# Patient Record
Sex: Male | Born: 1950
Health system: Southern US, Community
[De-identification: ages and names within clinical notes are randomized; demographics above are authoritative.]

## PROBLEM LIST (undated history)

## (undated) DIAGNOSIS — N400 Enlarged prostate without lower urinary tract symptoms: Secondary | ICD-10-CM

## (undated) DIAGNOSIS — I77810 Thoracic aortic ectasia: Secondary | ICD-10-CM

## (undated) DIAGNOSIS — I358 Other nonrheumatic aortic valve disorders: Secondary | ICD-10-CM

## (undated) DIAGNOSIS — R0600 Dyspnea, unspecified: Secondary | ICD-10-CM

## (undated) DIAGNOSIS — R9430 Abnormal result of cardiovascular function study, unspecified: Secondary | ICD-10-CM

## (undated) DIAGNOSIS — IMO0002 Reserved for concepts with insufficient information to code with codable children: Secondary | ICD-10-CM

## (undated) DIAGNOSIS — I10 Essential (primary) hypertension: Secondary | ICD-10-CM

## (undated) DIAGNOSIS — I2510 Atherosclerotic heart disease of native coronary artery without angina pectoris: Secondary | ICD-10-CM

## (undated) DIAGNOSIS — I209 Angina pectoris, unspecified: Secondary | ICD-10-CM

## (undated) HISTORY — DX: Other nonrheumatic aortic valve disorders: I35.8

## (undated) HISTORY — DX: Atherosclerotic heart disease of native coronary artery without angina pectoris: I25.10

## (undated) HISTORY — DX: Thoracic aortic ectasia: I77.810

## (undated) HISTORY — DX: Essential (primary) hypertension: I10

## (undated) HISTORY — DX: Reserved for concepts with insufficient information to code with codable children: IMO0002

## (undated) HISTORY — DX: Benign prostatic hyperplasia without lower urinary tract symptoms: N40.0

## (undated) HISTORY — DX: Abnormal result of cardiovascular function study, unspecified: R94.30

---

## 2007-03-19 HISTORY — PX: CARDIAC CATHETERIZATION: SHX172

## 2009-09-15 HISTORY — PX: CIRCUMCISION: SUR203

## 2010-12-03 ENCOUNTER — Encounter: Payer: Self-pay | Admitting: Cardiology

## 2010-12-03 DIAGNOSIS — IMO0002 Reserved for concepts with insufficient information to code with codable children: Secondary | ICD-10-CM | POA: Insufficient documentation

## 2010-12-03 DIAGNOSIS — R9430 Abnormal result of cardiovascular function study, unspecified: Secondary | ICD-10-CM | POA: Insufficient documentation

## 2010-12-03 DIAGNOSIS — I358 Other nonrheumatic aortic valve disorders: Secondary | ICD-10-CM | POA: Insufficient documentation

## 2010-12-04 ENCOUNTER — Ambulatory Visit (INDEPENDENT_AMBULATORY_CARE_PROVIDER_SITE_OTHER): Payer: BC Managed Care – PPO | Admitting: Cardiology

## 2010-12-04 ENCOUNTER — Encounter: Payer: Self-pay | Admitting: Cardiology

## 2010-12-04 DIAGNOSIS — I358 Other nonrheumatic aortic valve disorders: Secondary | ICD-10-CM

## 2010-12-04 DIAGNOSIS — I2510 Atherosclerotic heart disease of native coronary artery without angina pectoris: Secondary | ICD-10-CM

## 2010-12-04 DIAGNOSIS — I77810 Thoracic aortic ectasia: Secondary | ICD-10-CM | POA: Insufficient documentation

## 2010-12-04 DIAGNOSIS — I359 Nonrheumatic aortic valve disorder, unspecified: Secondary | ICD-10-CM

## 2010-12-04 DIAGNOSIS — R079 Chest pain, unspecified: Secondary | ICD-10-CM

## 2010-12-04 DIAGNOSIS — E119 Type 2 diabetes mellitus without complications: Secondary | ICD-10-CM | POA: Insufficient documentation

## 2010-12-04 DIAGNOSIS — I77819 Aortic ectasia, unspecified site: Secondary | ICD-10-CM

## 2010-12-04 DIAGNOSIS — I10 Essential (primary) hypertension: Secondary | ICD-10-CM

## 2010-12-04 DIAGNOSIS — R9430 Abnormal result of cardiovascular function study, unspecified: Secondary | ICD-10-CM | POA: Insufficient documentation

## 2010-12-04 MED ORDER — AMLODIPINE BESYLATE 10 MG PO TABS: 10.0000 mg | ORAL_TABLET | Freq: Every day | ORAL | Status: DC

## 2010-12-04 NOTE — Assessment & Plan Note (Signed)
The patient does have some aortic root dilatation by echo.  This is quite concerning with his significant hypertension.  We will work hard to control his blood pressure.  A followup echo can be done over time to reassess his aortic root.

## 2010-12-04 NOTE — Progress Notes (Signed)
HPI The patient is here today to establish with cardiology for multiple cardiac issues.  I reviewed the records from the patient's primary physician very carefully.  I've also looked in the records at [facility] and there are no records there.  The patient gives a history of heart catheterization in the past in [place].  I do not have those records.  According to the patient and his family he may have had some narrowing of one vessel.  He did not have intervention.  The patient had an echocardiogram done as an outpatient in July, 2012.  I reviewed the report.  Ejection fraction was 60%.  There was mild thickening of the mitral valve with mild regurgitation.  There was aortic valve sclerosis but no stenosis.  The aortic root measured 44 mm.  This is mildly dilated.  The patient has hypertension.  Review of the primary care notes show that he has had mild hypertension during some of those visits.  He has been treated aggressively with multiple medications.  Currently he is not on all meds that he was on the past.  He says that he is taking the current medicines are listed.  He takes his clonidine once a day instead of twice a day. Allergies  Allergen Reactions  . Ciprofloxacin     Current Outpatient Prescriptions  Medication Sig Dispense Refill  . aspirin EC 81 MG tablet Take 81 mg by mouth daily.        . cloNIDine (CATAPRES) 0.2 MG tablet Take 1 tablet by mouth Twice daily.      Marland Kitchen glipiZIDE (GLUCOTROL) 10 MG tablet Take 1 tablet by mouth Twice daily.      Marland Kitchen LEVEMIR FLEXPEN 100 UNIT/ML injection Inject 10 Units into the skin At bedtime.      Marland Kitchen lisinopril (PRINIVIL,ZESTRIL) 40 MG tablet Take 1 tablet by mouth Daily.      . metFORMIN (GLUCOPHAGE) 500 MG tablet Take 2 tablets by mouth Twice daily.        History   Social History  . Marital Status: Married    Spouse Name: N/A    Number of Children: N/A  . Years of Education: N/A   Occupational History  . Not on file.   Social  History Main Topics  . Smoking status: Former Smoker -- 0.3 packs/day for 40 years    Types: Cigarettes    Quit date: 03/18/2005  . Smokeless tobacco: Never Used  . Alcohol Use: Not on file  . Drug Use: Not on file  . Sexually Active: Not on file   Other Topics Concern  . Not on file   Social History Narrative  . No narrative on file    No family history on file.  Past Medical History  Diagnosis Date  . Ejection fraction     EF 60%,Echo, October 08, 2010  . Aortic root dilatation     Aortic root 44 mm, echo, October 08, 2010  . Aortic valve sclerosis     No stenosis, echo, July, 2012  . Hypertension   . Diabetes mellitus   . Gout   . BPH (benign prostatic hyperplasia)     No past surgical history on file.  ROS  Patient denies fever, chills, headache, sweats, rash, change in vision, change in hearing, chest pain, cough, nausea vomiting, urinary symptoms.  All other systems are reviewed and are negative  PHYSICAL EXAM Patient is stable today.  He is here with a family member.  He is  oriented to person time and place.  Affect is normal.  Head is atraumatic.  He has poor dentition.  There is no jugular venous distention.  There are no carotid bruits.  Lungs are clear.  Respiratory effort is nonlabored.  Cardiac exam reveals S1 and S2.  No clicks or significant murmurs.  The abdomen is soft.  There is no peripheral edema.  There no musculoskeletal deformities.  There is no skin rashes. Filed Vitals:   12/04/10 1346 12/04/10 1354  BP: 176/117 182/125  Pulse: 76 78  Height: 6\' 2"  (1.88 m)   Weight: 242 lb (109.77 kg)     EKG EKG is done today and reviewed by me.  There is normal sinus rhythm.  There is increased voltage but no significant ST-T wave changes. ASSESSMENT & PLAN

## 2010-12-04 NOTE — Assessment & Plan Note (Signed)
By history the patient underwent cardiac catheterization in 2009.  He was told there was some narrowing.  No intervention was done.  Is not having chest pain at this time.  He does not need further workup at this point.

## 2010-12-04 NOTE — Patient Instructions (Signed)
Follow up as scheduled. Start Norvasc (Amlodipine) 10 mg daily.

## 2010-12-04 NOTE — Assessment & Plan Note (Signed)
The patient's blood pressure is markedly elevated today.  He has known hypertension but it appears that it has not been this high in the past.  However he's been on additional medications in the past.  I will start today by changing his clonidine to twice daily.  We'll add amlodipine 10 mg daily.  He'll then see him for followup.  I talked with him about limiting his salt intake and trying to not drink fluids excessively.  However his mouth is dry from the clonidine

## 2010-12-04 NOTE — Assessment & Plan Note (Signed)
The patient has only aortic valve sclerosis.  There is a soft murmur.  There is no significant stenosis by history

## 2010-12-21 ENCOUNTER — Ambulatory Visit: Payer: BC Managed Care – PPO | Admitting: Cardiology

## 2011-01-06 DIAGNOSIS — R079 Chest pain, unspecified: Secondary | ICD-10-CM

## 2011-02-11 ENCOUNTER — Ambulatory Visit: Payer: BC Managed Care – PPO | Admitting: Cardiology

## 2011-07-14 ENCOUNTER — Other Ambulatory Visit: Payer: Self-pay | Admitting: Cardiology

## 2011-07-15 ENCOUNTER — Other Ambulatory Visit: Payer: Self-pay

## 2011-07-15 MED ORDER — AMLODIPINE BESYLATE 10 MG PO TABS: 10.0000 mg | ORAL_TABLET | Freq: Every day | ORAL | Status: DC

## 2011-11-01 ENCOUNTER — Other Ambulatory Visit: Payer: Self-pay | Admitting: Cardiology

## 2011-11-01 NOTE — Telephone Encounter (Signed)
Fax Received. Refill Completed. Osmany Azer Chowoe (R.M.A)   

## 2012-02-27 ENCOUNTER — Telehealth: Payer: Self-pay | Admitting: *Deleted

## 2012-02-27 MED ORDER — AMLODIPINE BESYLATE 10 MG PO TABS: 5.0000 mg | ORAL_TABLET | Freq: Every day | ORAL | Status: DC

## 2012-02-27 NOTE — Telephone Encounter (Signed)
A 

## 2013-03-17 ENCOUNTER — Other Ambulatory Visit: Payer: Self-pay | Admitting: Cardiology

## 2015-10-05 ENCOUNTER — Encounter: Payer: Self-pay | Admitting: *Deleted

## 2015-10-06 ENCOUNTER — Encounter: Payer: Self-pay | Admitting: Cardiovascular Disease

## 2015-10-06 ENCOUNTER — Ambulatory Visit (INDEPENDENT_AMBULATORY_CARE_PROVIDER_SITE_OTHER): Payer: Medicare Other | Admitting: Cardiovascular Disease

## 2015-10-06 VITALS — BP 198/106 | HR 64 | Ht 74.0 in | Wt 241.0 lb

## 2015-10-06 DIAGNOSIS — R51 Headache: Secondary | ICD-10-CM

## 2015-10-06 DIAGNOSIS — I10 Essential (primary) hypertension: Secondary | ICD-10-CM

## 2015-10-06 DIAGNOSIS — R519 Headache, unspecified: Secondary | ICD-10-CM

## 2015-10-06 MED ORDER — AMLODIPINE BESYLATE 5 MG PO TABS: 5.0000 mg | ORAL_TABLET | Freq: Every day | ORAL | Status: DC

## 2015-10-06 MED ORDER — AZILSARTAN MEDOXOMIL 80 MG PO TABS: 1.0000 | ORAL_TABLET | Freq: Every day | ORAL | Status: DC

## 2015-10-06 NOTE — Progress Notes (Signed)
Patient ID: Alfred Miller, male   DOB: 1950/06/11, 65 y.o.   MRN: [id number]       CARDIOLOGY CONSULT NOTE  Patient ID: Alfred Miller MRN: [id number] DOB/AGE: [birth year] 65 y.o.  Admit date: (Not on file) Primary Physician: Glenda Chroman, MD Referring Physician: Clyde Lundborg MD  Reason for Consultation: CAD, malignant HTN  HPI: The patient is a 65 year old male with a history of prostate cancer who is referred for the management of malignant hypertension. Systolic blood pressure is over 200 today. On 08/07/15 180/104.  He saw Dr. Ron Parker in September 2012. At that time he added amlodipine 10 mg daily. Today he is taking 5 mg daily. He also has a history of diabetes. He also reportedly has a history of aortic dilatation by echocardiogram. Left ventricular systolic function was normal at that time. There was some degree of aortic sclerosis without stenosis. He reportedly underwent coronary angiography in 2009 but did not undergo percutaneous coronary mention.  He appears to have a cognitive disability. Neither he nor his family member have a clear idea of what medications he is taking. They appear to be frustrated with his PCP because he keeps increasing medications to control his blood pressure. My nurse contacted his pharmacy and it appears neither carvedilol nor clonidine have been filled and he is only taking lisinopril 40 mg today.  ECG performed in the office today which I personally reviewed demonstrates normal sinus rhythm with no ischemic ST segment or T-wave abnormalities, nor any arrhythmias.  He denies exertional chest pain and shortness of breath. He does have headaches. He denies palpitations and leg swelling.  Allergies  Allergen Reactions  . Ciprofloxacin     Current Outpatient Prescriptions  Medication Sig Dispense Refill  . Insulin Glargine (TOUJEO SOLOSTAR [place]) Inject into the skin as directed.    Marland Kitchen amLODipine (NORVASC) 5 MG tablet TAKE ONE TABLET BY MOUTH EVERY DAY 7 tablet 0    . aspirin EC 81 MG tablet Take 81 mg by mouth daily.      . cloNIDine (CATAPRES) 0.2 MG tablet Take 1 tablet by mouth Twice daily.    Marland Kitchen glipiZIDE (GLUCOTROL) 10 MG tablet Take 1 tablet by mouth Twice daily.    Marland Kitchen LEVEMIR FLEXPEN 100 UNIT/ML injection Inject 10 Units into the skin At bedtime.    Marland Kitchen lisinopril (PRINIVIL,ZESTRIL) 40 MG tablet Take 1 tablet by mouth Daily.    . metFORMIN (GLUCOPHAGE) 500 MG tablet Take 2 tablets by mouth Twice daily.     No current facility-administered medications for this visit.    Past Medical History  Diagnosis Date  . Ejection fraction     EF 60%,Echo, October 08, 2010  . Aortic root dilatation (HCC)     Aortic root 44 mm, echo, October 08, 2010  . Aortic valve sclerosis     No stenosis, echo, July, 2012  . Hypertension   . Diabetes mellitus   . Gout   . BPH (benign prostatic hyperplasia)   . CAD (coronary artery disease)     Catheterization 2009, "some narrowing"    Past Surgical History  Procedure Laterality Date  . Circumcision  09/2009  . Cardiac catheterization  2009    Social History   Social History  . Marital Status: Married    Spouse Name: N/A  . Number of Children: N/A  . Years of Education: N/A   Occupational History  . Not on file.   Social History Main Topics  . Smoking status: Former Smoker --  0.30 packs/day for 40 years    Types: Cigarettes    Start date: 06/26/1968    Quit date: 03/18/2005  . Smokeless tobacco: Never Used  . Alcohol Use: Not on file  . Drug Use: Not on file  . Sexual Activity: Not on file   Other Topics Concern  . Not on file   Social History Narrative     No family history of premature CAD in 1st degree relatives.  Prior to Admission medications   Medication Sig Start Date End Date Taking? Authorizing Provider  Insulin Glargine (TOUJEO SOLOSTAR [place]) Inject into the skin as directed.   Yes Historical Provider, MD  amLODipine (NORVASC) 5 MG tablet TAKE ONE TABLET BY MOUTH EVERY DAY 03/17/13    Carlena Bjornstad, MD  aspirin EC 81 MG tablet Take 81 mg by mouth daily.      Historical Provider, MD  cloNIDine (CATAPRES) 0.2 MG tablet Take 1 tablet by mouth Twice daily. 12/03/10   Historical Provider, MD  glipiZIDE (GLUCOTROL) 10 MG tablet Take 1 tablet by mouth Twice daily. 12/03/10   Historical Provider, MD  LEVEMIR FLEXPEN 100 UNIT/ML injection Inject 10 Units into the skin At bedtime. 12/03/10   Historical Provider, MD  lisinopril (PRINIVIL,ZESTRIL) 40 MG tablet Take 1 tablet by mouth Daily. 12/03/10   Historical Provider, MD  metFORMIN (GLUCOPHAGE) 500 MG tablet Take 2 tablets by mouth Twice daily. 12/03/10   Historical Provider, MD     Review of systems complete and found to be negative unless listed above in HPI     Physical exam Blood pressure 198/106, pulse 64, height 6\' 2"  (1.88 m), weight 241 lb (109.317 kg), SpO2 96 %. General: NAD Neck: No JVD, no thyromegaly or thyroid nodule.  Lungs: Clear to auscultation bilaterally with normal respiratory effort. CV: Nondisplaced PMI. Regular rate and rhythm, normal S1/S2, no S3/S4, no murmur.  No peripheral edema.  No carotid bruit.   Abdomen: Soft, nontender, no distention.  Skin: Intact without lesions or rashes.  Neurologic: Alert and oriented x 3.  Psych: Normal affect. Extremities: No clubbing or cyanosis.  HEENT: Normal.   ECG: Most recent ECG reviewed.  Labs:  No results found for: WBC, HGB, HCT, MCV, PLT No results for input(s): NA, K, CL, CO2, BUN, CREATININE, CALCIUM, PROT, BILITOT, ALKPHOS, ALT, AST, GLUCOSE in the last 168 hours.  Invalid input(s): LABALBU No results found for: CKTOTAL, CKMB, CKMBINDEX, TROPONINI No results found for: CHOL No results found for: HDL No results found for: LDLCALC No results found for: TRIG No results found for: CHOLHDL No results found for: LDLDIRECT       Studies: No results found.  ASSESSMENT AND PLAN:  1. Malignant HTN: Will start amlodipine 5 mg daily. Will switch  lisinopril 40 mg to Edarbi 80 mg daily.  Will not use Coreg given resting HR in 60 bpm range. I will order a 2-D echocardiogram with Doppler to evaluate cardiac structure, function, and regional wall motion.  Dispo: fu with nurse in 3 weeks for BP check. FU with me in 3 months.    Signed: Kate Sable, M.D., F.A.C.C.  10/06/2015, 9:16 AM

## 2015-10-06 NOTE — Addendum Note (Signed)
Addended by: Merlene Laughter on: 10/06/2015 11:46 AM   Modules accepted: Orders, Medications

## 2015-10-06 NOTE — Patient Instructions (Addendum)
Medication Instructions:   Begin Amlodipine 5mg  daily - printed script given today.   Stop Lisinopril.  Begin Edarbi 80mg  daily - samples, printed script, and [employer] given.    Continue all other medications the same. Please refer to the list of medications on this paper for your current medication list.    Labwork: NONE  Testing/Procedures:  Your physician has requested that you have an echocardiogram. Echocardiography is a painless test that uses sound waves to create images of your heart. It provides your doctor with information about the size and shape of your heart and how well your heart's chambers and valves are working. This procedure takes approximately one hour. There are no restrictions for this procedure.  Office will contact with results via phone or letter.    Follow-Up:  3 months   Any Other Special Instructions Will Be Listed Below (If Applicable).  Nurse visit in 3 weeks for blood pressure check in office.    If you need a refill on your cardiac medications before your next appointment, please call your pharmacy.

## 2015-10-27 ENCOUNTER — Encounter: Payer: Self-pay | Admitting: *Deleted

## 2015-10-27 ENCOUNTER — Ambulatory Visit (INDEPENDENT_AMBULATORY_CARE_PROVIDER_SITE_OTHER): Payer: Medicare Other | Admitting: *Deleted

## 2015-10-27 ENCOUNTER — Telehealth: Payer: Self-pay | Admitting: *Deleted

## 2015-10-27 VITALS — BP 160/97 | HR 62 | Ht 74.0 in | Wt 241.0 lb

## 2015-10-27 DIAGNOSIS — I10 Essential (primary) hypertension: Secondary | ICD-10-CM

## 2015-10-27 MED ORDER — AMLODIPINE BESYLATE 10 MG PO TABS: 10.0000 mg | ORAL_TABLET | Freq: Every day | ORAL | 3 refills | Status: DC

## 2015-10-27 NOTE — Progress Notes (Signed)
Increase amlodipine to 10 mg

## 2015-10-27 NOTE — Progress Notes (Signed)
Patient came to office today for nurse BP check per last office visit. Patient did not bring his medications to the visit but verbalized that he is taking the medications that were called out to patient during nurse visit. Patient said he has been taking his medications as prescribed without missing any dose. Patient denies side effects, chest pain, dizziness or sob. Patient advised that his BP result would be sent to his doctor.

## 2015-10-27 NOTE — Telephone Encounter (Signed)
Wife informed and verbalized understanding of plan. 

## 2015-10-27 NOTE — Telephone Encounter (Signed)
-----   Message from Herminio Commons, MD sent at 10/27/2015 10:06 AM EDT -----   ----- Message ----- From: Merlene Laughter, LPN Sent: [id number]   9:41 AM To: Herminio Commons, MD

## 2015-10-30 ENCOUNTER — Other Ambulatory Visit: Payer: Self-pay | Admitting: *Deleted

## 2015-10-30 MED ORDER — AZILSARTAN MEDOXOMIL 80 MG PO TABS: 1.0000 | ORAL_TABLET | Freq: Every day | ORAL | 6 refills | Status: DC

## 2015-11-01 ENCOUNTER — Other Ambulatory Visit: Payer: Medicare Other

## 2015-11-22 ENCOUNTER — Other Ambulatory Visit: Payer: Medicare Other

## 2015-12-04 ENCOUNTER — Other Ambulatory Visit: Payer: Self-pay | Admitting: Cardiovascular Disease

## 2015-12-04 DIAGNOSIS — I2510 Atherosclerotic heart disease of native coronary artery without angina pectoris: Secondary | ICD-10-CM

## 2015-12-14 ENCOUNTER — Other Ambulatory Visit: Payer: Medicare Other

## 2015-12-21 ENCOUNTER — Telehealth: Payer: Self-pay | Admitting: Cardiovascular Disease

## 2015-12-21 NOTE — Telephone Encounter (Signed)
Mrs. Talford called the office in regards to an upcoming appointment for Alfred Miller to have an echo. She states that he is doing radiation on a daily basis at [facility]. She states she will call back when patient is able.

## 2015-12-21 NOTE — Telephone Encounter (Signed)
Noted  

## 2015-12-29 ENCOUNTER — Ambulatory Visit: Payer: Medicare Other | Admitting: Cardiovascular Disease

## 2016-02-05 ENCOUNTER — Telehealth: Payer: Self-pay | Admitting: Cardiovascular Disease

## 2016-02-05 DIAGNOSIS — I2510 Atherosclerotic heart disease of native coronary artery without angina pectoris: Secondary | ICD-10-CM

## 2016-02-05 MED ORDER — LISINOPRIL 40 MG PO TABS: 40.0000 mg | ORAL_TABLET | Freq: Every day | ORAL | 3 refills | Status: DC

## 2016-02-05 NOTE — Telephone Encounter (Signed)
According to pt Alfred Miller would cost $90 monthly after he has already applied for assistance and insurance coverage. Says that at some point Azilsartan 80 mg was sent in place of this and this also will cost $90 / monthly. Says he hasn't been taking [place] in months.  Also wants to reschedule echo - have sent to schedulers.

## 2016-02-05 NOTE — Telephone Encounter (Signed)
Can go back to lisinopril 40 mg and have BP checked in a week by nurse. If it remains elevated, would increase amlodipine to 10 mg.

## 2016-02-05 NOTE — Telephone Encounter (Signed)
Would first contact pharm rep to see what, if anything, can be done to assist patient.

## 2016-02-05 NOTE — Telephone Encounter (Signed)
Patient's wife called stating that they can not afford medication and would like to know if something else he can take [place]

## 2016-02-05 NOTE — Telephone Encounter (Signed)
Pt aware - lisinopril sent to pharmacy - pt will come 11/29 for nurse BP check and 12/7 for echo.

## 2016-02-22 ENCOUNTER — Telehealth: Payer: Self-pay | Admitting: *Deleted

## 2016-02-22 ENCOUNTER — Ambulatory Visit: Payer: Medicare Other | Admitting: *Deleted

## 2016-02-22 ENCOUNTER — Other Ambulatory Visit: Payer: Self-pay

## 2016-02-22 ENCOUNTER — Ambulatory Visit (INDEPENDENT_AMBULATORY_CARE_PROVIDER_SITE_OTHER): Payer: Medicare Other

## 2016-02-22 VITALS — BP 120/76 | HR 60

## 2016-02-22 DIAGNOSIS — I2510 Atherosclerotic heart disease of native coronary artery without angina pectoris: Secondary | ICD-10-CM | POA: Diagnosis not present

## 2016-02-22 DIAGNOSIS — I10 Essential (primary) hypertension: Secondary | ICD-10-CM

## 2016-02-22 NOTE — Telephone Encounter (Signed)
Notes Recorded by Laurine Blazer, LPN on [id number] at [id number] PM EST Wife Peter [place]) notified. Follow up OV scheduled as he was last seen in July 2017. Copy to pmd. ------  Notes Recorded by Herminio Commons, MD on 02/22/2016 at 4:20 PM EST Normal pumping function. Mild aortic enlargement. In one year, would obtain CTA of thoracic aorta.

## 2016-02-22 NOTE — Progress Notes (Signed)
Good results. Continue current therapy.

## 2016-02-22 NOTE — Progress Notes (Signed)
Wife Peter Congo) notified.

## 2016-02-22 NOTE — Progress Notes (Signed)
Patient in office this morning for nurse BP check. Back on the Lisinopril 40mg  daily.

## 2016-03-15 ENCOUNTER — Encounter: Payer: Self-pay | Admitting: *Deleted

## 2016-03-19 ENCOUNTER — Encounter: Payer: Self-pay | Admitting: Cardiovascular Disease

## 2016-03-19 ENCOUNTER — Ambulatory Visit (INDEPENDENT_AMBULATORY_CARE_PROVIDER_SITE_OTHER): Payer: Medicare HMO | Admitting: Cardiovascular Disease

## 2016-03-19 VITALS — BP 170/110 | HR 84 | Ht 74.0 in | Wt 248.0 lb

## 2016-03-19 DIAGNOSIS — I10 Essential (primary) hypertension: Secondary | ICD-10-CM

## 2016-03-19 DIAGNOSIS — I7789 Other specified disorders of arteries and arterioles: Secondary | ICD-10-CM | POA: Diagnosis not present

## 2016-03-19 MED ORDER — CARVEDILOL 3.125 MG PO TABS: 3.1250 mg | ORAL_TABLET | Freq: Two times a day (BID) | ORAL | 6 refills | Status: DC

## 2016-03-19 NOTE — Progress Notes (Signed)
SUBJECTIVE: The patient presents for follow-up of malignant hypertension. Echocardiogram 02/22/16: Normal left ventricular systolic function and regional wall motion, LVEF 55-60%, mild-to-moderate concentric LVH, indeterminate grade diastolic dysfunction, mild aortic root and ascending aortic enlargement, 4.2 cm.  He has intermittent chest pains primarily when he is arguing with his wife. It is accompanied by shortness of breath. This is not frequent. He denies leg swelling.  Review of Systems: As per "subjective", otherwise negative.  Allergies  Allergen Reactions  . Ciprofloxacin     Current Outpatient Prescriptions  Medication Sig Dispense Refill  . aspirin EC 81 MG tablet Take 81 mg by mouth daily. Reported on 10/06/2015    . bicalutamide (CASODEX) 50 MG tablet Take 50 mg by mouth daily.    Marland Kitchen glipiZIDE (GLUCOTROL) 10 MG tablet Take 1 tablet by mouth Twice daily.    . Insulin Glargine (TOUJEO SOLOSTAR [place]) Inject into the skin as directed.    Marland Kitchen lisinopril (PRINIVIL,ZESTRIL) 40 MG tablet Take 1 tablet (40 mg total) by mouth daily. 90 tablet 3  . amLODipine (NORVASC) 10 MG tablet Take 1 tablet (10 mg total) by mouth daily. 90 tablet 3   No current facility-administered medications for this visit.     Past Medical History:  Diagnosis Date  . Aortic root dilatation (HCC)    Aortic root 44 mm, echo, October 08, 2010  . Aortic valve sclerosis    No stenosis, echo, July, 2012  . BPH (benign prostatic hyperplasia)   . CAD (coronary artery disease)    Catheterization 2009, "some narrowing"  . Diabetes mellitus   . Ejection fraction    EF 60%,Echo, October 08, 2010  . Gout   . Hypertension     Past Surgical History:  Procedure Laterality Date  . CARDIAC CATHETERIZATION  2009  . CIRCUMCISION  09/2009    Social History   Social History  . Marital status: Married    Spouse name: N/A  . Number of children: N/A  . Years of education: N/A   Occupational History  . Not on  file.   Social History Main Topics  . Smoking status: Former Smoker    Packs/day: 0.30    Years: 40.00    Types: Cigarettes    Start date: 06/26/1968    Quit date: 03/18/2005  . Smokeless tobacco: Never Used  . Alcohol use Not on file  . Drug use: Unknown  . Sexual activity: Not on file   Other Topics Concern  . Not on file   Social History Narrative  . No narrative on file     Vitals:   03/19/16 1543  BP: (!) 170/110  Pulse: 84  SpO2: 98%  Weight: 248 lb (112.5 kg)  Height: 6\' 2"  (1.88 m)    PHYSICAL EXAM General: NAD HEENT: Normal. Neck: No JVD, no thyromegaly. Lungs: Clear to auscultation bilaterally with normal respiratory effort. CV: Nondisplaced PMI.  Regular rate and rhythm, normal S1/S2, no S3/S4, no murmur. No pretibial or periankle edema.  No carotid bruit.   Abdomen: Soft, nontender, no distention.  Neurologic: Alert and oriented.  Psych: Normal affect. Skin: Normal. Musculoskeletal: No gross deformities.    ECG: Most recent ECG reviewed.      ASSESSMENT AND PLAN: 1. Malignant hypertension: Markedly elevated on amlodipine 10 mg and lisinopril 40 mg. Will start Coreg 3.125 mg bid.  2. Mild aortic root and ascending aortic enlargement, 4.2 cm: Obtain CTA in 1 year.  Dispo: fu 6 months  Kate Sable, M.D., F.A.C.C.

## 2016-03-19 NOTE — Patient Instructions (Signed)
Medication Instructions:   Begin Coreg 3.125mg  twice a day   Continue all other medications.    Labwork: none  Testing/Procedures: none  Follow-Up: Your physician wants you to follow up in: 6 months.  You will receive a reminder letter in the mail one-two months in advance.  If you don't receive a letter, please call our office to schedule the follow up appointment   Any Other Special Instructions Will Be Listed Below (If Applicable).  If you need a refill on your cardiac medications before your next appointment, please call your pharmacy.

## 2016-04-10 DIAGNOSIS — J069 Acute upper respiratory infection, unspecified: Secondary | ICD-10-CM | POA: Diagnosis not present

## 2016-04-10 DIAGNOSIS — E119 Type 2 diabetes mellitus without complications: Secondary | ICD-10-CM | POA: Diagnosis not present

## 2016-04-10 DIAGNOSIS — R509 Fever, unspecified: Secondary | ICD-10-CM | POA: Diagnosis not present

## 2016-04-10 DIAGNOSIS — R05 Cough: Secondary | ICD-10-CM | POA: Diagnosis not present

## 2016-04-16 DIAGNOSIS — R3915 Urgency of urination: Secondary | ICD-10-CM | POA: Diagnosis not present

## 2016-04-16 DIAGNOSIS — C61 Malignant neoplasm of prostate: Secondary | ICD-10-CM | POA: Diagnosis not present

## 2016-07-19 ENCOUNTER — Ambulatory Visit (INDEPENDENT_AMBULATORY_CARE_PROVIDER_SITE_OTHER): Payer: Medicare HMO | Admitting: Urology

## 2016-07-19 DIAGNOSIS — C61 Malignant neoplasm of prostate: Secondary | ICD-10-CM

## 2016-07-19 DIAGNOSIS — N5201 Erectile dysfunction due to arterial insufficiency: Secondary | ICD-10-CM | POA: Diagnosis not present

## 2016-07-19 DIAGNOSIS — N393 Stress incontinence (female) (male): Secondary | ICD-10-CM | POA: Diagnosis not present

## 2016-07-19 DIAGNOSIS — Z8551 Personal history of malignant neoplasm of bladder: Secondary | ICD-10-CM

## 2016-07-23 ENCOUNTER — Ambulatory Visit (INDEPENDENT_AMBULATORY_CARE_PROVIDER_SITE_OTHER): Payer: Medicare HMO | Admitting: Urology

## 2016-07-23 DIAGNOSIS — C61 Malignant neoplasm of prostate: Secondary | ICD-10-CM | POA: Diagnosis not present

## 2016-08-08 DIAGNOSIS — Z0000 Encounter for general adult medical examination without abnormal findings: Secondary | ICD-10-CM | POA: Diagnosis not present

## 2016-08-08 DIAGNOSIS — Z1389 Encounter for screening for other disorder: Secondary | ICD-10-CM | POA: Diagnosis not present

## 2016-08-08 DIAGNOSIS — E7800 Pure hypercholesterolemia, unspecified: Secondary | ICD-10-CM | POA: Diagnosis not present

## 2016-08-08 DIAGNOSIS — R5383 Other fatigue: Secondary | ICD-10-CM | POA: Diagnosis not present

## 2016-08-08 DIAGNOSIS — E1142 Type 2 diabetes mellitus with diabetic polyneuropathy: Secondary | ICD-10-CM | POA: Diagnosis not present

## 2016-08-08 DIAGNOSIS — Z7189 Other specified counseling: Secondary | ICD-10-CM | POA: Diagnosis not present

## 2016-08-08 DIAGNOSIS — I10 Essential (primary) hypertension: Secondary | ICD-10-CM | POA: Diagnosis not present

## 2016-08-08 DIAGNOSIS — Z79899 Other long term (current) drug therapy: Secondary | ICD-10-CM | POA: Diagnosis not present

## 2016-08-08 DIAGNOSIS — Z1211 Encounter for screening for malignant neoplasm of colon: Secondary | ICD-10-CM | POA: Diagnosis not present

## 2016-08-08 DIAGNOSIS — Z6832 Body mass index (BMI) 32.0-32.9, adult: Secondary | ICD-10-CM | POA: Diagnosis not present

## 2016-08-08 DIAGNOSIS — Z125 Encounter for screening for malignant neoplasm of prostate: Secondary | ICD-10-CM | POA: Diagnosis not present

## 2016-08-08 DIAGNOSIS — Z299 Encounter for prophylactic measures, unspecified: Secondary | ICD-10-CM | POA: Diagnosis not present

## 2016-09-19 DIAGNOSIS — I10 Essential (primary) hypertension: Secondary | ICD-10-CM | POA: Diagnosis not present

## 2016-09-19 DIAGNOSIS — E119 Type 2 diabetes mellitus without complications: Secondary | ICD-10-CM | POA: Diagnosis not present

## 2016-09-19 DIAGNOSIS — Z923 Personal history of irradiation: Secondary | ICD-10-CM | POA: Diagnosis not present

## 2016-09-19 DIAGNOSIS — E669 Obesity, unspecified: Secondary | ICD-10-CM | POA: Diagnosis not present

## 2016-09-19 DIAGNOSIS — Z6830 Body mass index (BMI) 30.0-30.9, adult: Secondary | ICD-10-CM | POA: Diagnosis not present

## 2016-09-19 DIAGNOSIS — Z79899 Other long term (current) drug therapy: Secondary | ICD-10-CM | POA: Diagnosis not present

## 2016-09-19 DIAGNOSIS — C61 Malignant neoplasm of prostate: Secondary | ICD-10-CM | POA: Diagnosis not present

## 2016-09-19 DIAGNOSIS — Z794 Long term (current) use of insulin: Secondary | ICD-10-CM | POA: Diagnosis not present

## 2016-09-19 DIAGNOSIS — Z0000 Encounter for general adult medical examination without abnormal findings: Secondary | ICD-10-CM | POA: Diagnosis not present

## 2016-09-19 DIAGNOSIS — K08409 Partial loss of teeth, unspecified cause, unspecified class: Secondary | ICD-10-CM | POA: Diagnosis not present

## 2016-09-23 ENCOUNTER — Ambulatory Visit (INDEPENDENT_AMBULATORY_CARE_PROVIDER_SITE_OTHER): Payer: Medicare HMO | Admitting: Cardiovascular Disease

## 2016-09-23 ENCOUNTER — Encounter: Payer: Self-pay | Admitting: Cardiovascular Disease

## 2016-09-23 VITALS — BP 128/84 | HR 60 | Ht 74.0 in | Wt 234.0 lb

## 2016-09-23 DIAGNOSIS — I10 Essential (primary) hypertension: Secondary | ICD-10-CM

## 2016-09-23 DIAGNOSIS — I7789 Other specified disorders of arteries and arterioles: Secondary | ICD-10-CM

## 2016-09-23 DIAGNOSIS — R531 Weakness: Secondary | ICD-10-CM

## 2016-09-23 NOTE — Progress Notes (Signed)
SUBJECTIVE: The patient presents for follow-up of malignant hypertension. Echocardiogram 02/22/16: Normal left ventricular systolic function and regional wall motion, LVEF 55-60%, mild-to-moderate concentric LVH, indeterminate grade diastolic dysfunction, mild aortic root and ascending aortic enlargement, 4.2 cm.  ECG performed in the office today which I ordered and personally interpreted demonstrated sinus bradycardia, 58 bpm, and nonspecific T-wave abnormalities.  He denies chest pain, palpitations, and shortness of breath. He has a list of other somatic complaints which include lower extremity weakness when getting up in the morning with history of falls. He denies having seen a neurologist.   Review of Systems: As per "subjective", otherwise negative.  Allergies  Allergen Reactions  . Ciprofloxacin     Current Outpatient Prescriptions  Medication Sig Dispense Refill  . amLODipine (NORVASC) 10 MG tablet     . aspirin EC 81 MG tablet Take 81 mg by mouth daily. Reported on 10/06/2015    . bicalutamide (CASODEX) 50 MG tablet Take 50 mg by mouth daily.    . carvedilol (COREG) 3.125 MG tablet Take 1 tablet (3.125 mg total) by mouth 2 (two) times daily. 60 tablet 6  . glipiZIDE (GLUCOTROL) 10 MG tablet Take 1 tablet by mouth Twice daily.    . Insulin Glargine (TOUJEO SOLOSTAR [place]) Inject into the skin as directed.    Marland Kitchen lisinopril (PRINIVIL,ZESTRIL) 40 MG tablet      No current facility-administered medications for this visit.     Past Medical History:  Diagnosis Date  . Aortic root dilatation (HCC)    Aortic root 44 mm, echo, October 08, 2010  . Aortic valve sclerosis    No stenosis, echo, July, 2012  . BPH (benign prostatic hyperplasia)   . CAD (coronary artery disease)    Catheterization 2009, "some narrowing"  . Diabetes mellitus   . Ejection fraction    EF 60%,Echo, October 08, 2010  . Gout   . Hypertension     Past Surgical History:  Procedure Laterality Date  .  CARDIAC CATHETERIZATION  2009  . CIRCUMCISION  09/2009    Social History   Social History  . Marital status: Married    Spouse name: N/A  . Number of children: N/A  . Years of education: N/A   Occupational History  . Not on file.   Social History Main Topics  . Smoking status: Former Smoker    Packs/day: 0.30    Years: 40.00    Types: Cigarettes    Start date: 06/26/1968    Quit date: 03/18/2005  . Smokeless tobacco: Never Used  . Alcohol use Not on file  . Drug use: Unknown  . Sexual activity: Not on file   Other Topics Concern  . Not on file   Social History Narrative  . No narrative on file     Vitals:   09/23/16 0830  BP: 128/84  Pulse: 60  SpO2: 94%  Weight: 234 lb (106.1 kg)  Height: 6\' 2"  (1.88 m)    Wt Readings from Last 3 Encounters:  09/23/16 234 lb (106.1 kg)  03/19/16 248 lb (112.5 kg)  10/27/15 241 lb (109.3 kg)     PHYSICAL EXAM General: NAD HEENT: Normal. Neck: No JVD, no thyromegaly. Lungs: Clear to auscultation bilaterally with normal respiratory effort. CV: Nondisplaced PMI.  Regular rate and rhythm, normal S1/S2, no S3/S4, no murmur. No pretibial or periankle edema.  No carotid bruit.   Abdomen: Soft, nontender, no distention.  Neurologic: Alert and oriented.  Psych: Normal  affect. Skin: Normal. Musculoskeletal: No gross deformities.    ECG: Most recent ECG reviewed.   Labs: No results found for: K, BUN, CREATININE, ALT, TSH, HGB   Lipids: No results found for: LDLCALC, LDLDIRECT, CHOL, TRIG, HDL     ASSESSMENT AND PLAN:  1. Malignant hypertension: Controlled on present therapy. No changes.  2. Mild aortic root and ascending aortic enlargement, 4.2 cm: Obtain CTA in 1 year.  3. Generalized weakness with history of falls: I will make a neurology referral.    Disposition: Follow up 1 yr.  Kate Sable, M.D., F.A.C.C.

## 2016-09-23 NOTE — Patient Instructions (Signed)
Medication Instructions:  Continue all current medications.  Labwork: none  Testing/Procedures: none  Follow-Up: Your physician wants you to follow up in:  1 year.  You will receive a reminder letter in the mail one-two months in advance.  If you don't receive a letter, please call our office to schedule the follow up appointment  . Any Other Special Instructions Will Be Listed Below (If Applicable). You have been referred to:  Dr. Phillips Odor Linna Hoff   If you need a refill on your cardiac medications before your next appointment, please call your pharmacy.

## 2016-10-01 DIAGNOSIS — M159 Polyosteoarthritis, unspecified: Secondary | ICD-10-CM | POA: Diagnosis not present

## 2016-10-01 DIAGNOSIS — Z6831 Body mass index (BMI) 31.0-31.9, adult: Secondary | ICD-10-CM | POA: Diagnosis not present

## 2016-10-01 DIAGNOSIS — Z299 Encounter for prophylactic measures, unspecified: Secondary | ICD-10-CM | POA: Diagnosis not present

## 2016-10-01 DIAGNOSIS — E1142 Type 2 diabetes mellitus with diabetic polyneuropathy: Secondary | ICD-10-CM | POA: Diagnosis not present

## 2016-10-01 DIAGNOSIS — I10 Essential (primary) hypertension: Secondary | ICD-10-CM | POA: Diagnosis not present

## 2016-10-01 DIAGNOSIS — I259 Chronic ischemic heart disease, unspecified: Secondary | ICD-10-CM | POA: Diagnosis not present

## 2016-10-02 ENCOUNTER — Telehealth: Payer: Self-pay | Admitting: Cardiovascular Disease

## 2016-10-02 NOTE — Telephone Encounter (Signed)
Forward to provider for any further suggestions.

## 2016-10-02 NOTE — Telephone Encounter (Signed)
Was told by his PCP, Dr Woody Seller, that patient was taken  too strong of a dose of carvedilol (COREG) 3.125 MG tablet and that he needed to cut it in half.   Also told him that he did not need to see a neurologist.  They cancelled appt with neurologist per Dr Marcial Pacas instructions.

## 2016-10-02 NOTE — Telephone Encounter (Signed)
No further suggestions

## 2016-10-03 MED ORDER — CARVEDILOL 3.125 MG PO TABS: ORAL_TABLET | ORAL | Status: DC

## 2016-10-03 NOTE — Telephone Encounter (Signed)
Patient & wife Peter Congo) notified.

## 2016-10-03 NOTE — Telephone Encounter (Signed)
Alfred Miller returned a call to [place]. Please return the call.

## 2016-10-03 NOTE — Telephone Encounter (Signed)
Left message to return call 

## 2016-10-22 DIAGNOSIS — C61 Malignant neoplasm of prostate: Secondary | ICD-10-CM | POA: Diagnosis not present

## 2016-10-25 ENCOUNTER — Ambulatory Visit (INDEPENDENT_AMBULATORY_CARE_PROVIDER_SITE_OTHER): Payer: Medicare HMO | Admitting: Urology

## 2016-10-25 DIAGNOSIS — N393 Stress incontinence (female) (male): Secondary | ICD-10-CM | POA: Diagnosis not present

## 2016-10-25 DIAGNOSIS — Z8551 Personal history of malignant neoplasm of bladder: Secondary | ICD-10-CM

## 2016-10-25 DIAGNOSIS — C61 Malignant neoplasm of prostate: Secondary | ICD-10-CM | POA: Diagnosis not present

## 2016-11-08 ENCOUNTER — Ambulatory Visit (INDEPENDENT_AMBULATORY_CARE_PROVIDER_SITE_OTHER): Payer: Medicare HMO | Admitting: Urology

## 2016-11-08 ENCOUNTER — Other Ambulatory Visit (HOSPITAL_COMMUNITY)
Admission: RE | Admit: 2016-11-08 | Discharge: 2016-11-08 | Disposition: A | Discharge status: Home / Self Care | Payer: Medicare HMO | Source: Other Acute Inpatient Hospital | Attending: Urology | Admitting: Urology

## 2016-11-08 DIAGNOSIS — Z8551 Personal history of malignant neoplasm of bladder: Secondary | ICD-10-CM

## 2016-11-08 DIAGNOSIS — C61 Malignant neoplasm of prostate: Secondary | ICD-10-CM | POA: Diagnosis not present

## 2016-11-08 DIAGNOSIS — N341 Nonspecific urethritis: Secondary | ICD-10-CM

## 2016-11-08 LAB — URINALYSIS, ROUTINE W REFLEX MICROSCOPIC
Bacteria, UA: NONE SEEN
Bilirubin Urine: NEGATIVE
Glucose, UA: >=500 mg/dL — AB
Hgb urine dipstick: NEGATIVE
Ketones, ur: NEGATIVE mg/dL
Nitrite: NEGATIVE
Protein, ur: 30 mg/dL — AB
Specific Gravity, Urine: 1.028 (ref 1.005–1.030)
pH: 5 (ref 5.0–8.0)

## 2016-11-27 ENCOUNTER — Telehealth: Payer: Self-pay | Admitting: Cardiovascular Disease

## 2016-11-27 NOTE — Telephone Encounter (Signed)
Would like to know why her husband has to have test CTA    There is a recall in the system.  They received a letter to call and schedule test

## 2016-11-27 NOTE — Telephone Encounter (Signed)
Does pt need CTA? LOV says CTA 1 year from 09/23/16 - pt has not had test since 09/2015 at [facility]

## 2016-11-28 NOTE — Telephone Encounter (Signed)
Pt aware - recall placed for 7/19

## 2016-11-28 NOTE — Telephone Encounter (Signed)
He had an echo in Dec 2017. CTA 1 in 09/2017.

## 2017-01-20 DIAGNOSIS — C61 Malignant neoplasm of prostate: Secondary | ICD-10-CM | POA: Diagnosis not present

## 2017-01-24 ENCOUNTER — Ambulatory Visit (INDEPENDENT_AMBULATORY_CARE_PROVIDER_SITE_OTHER): Payer: Medicare HMO | Admitting: Urology

## 2017-01-24 DIAGNOSIS — Z8551 Personal history of malignant neoplasm of bladder: Secondary | ICD-10-CM | POA: Diagnosis not present

## 2017-01-24 DIAGNOSIS — Z8546 Personal history of malignant neoplasm of prostate: Secondary | ICD-10-CM | POA: Diagnosis not present

## 2017-03-27 DIAGNOSIS — K529 Noninfective gastroenteritis and colitis, unspecified: Secondary | ICD-10-CM | POA: Diagnosis not present

## 2017-03-27 DIAGNOSIS — K219 Gastro-esophageal reflux disease without esophagitis: Secondary | ICD-10-CM | POA: Diagnosis not present

## 2017-03-27 DIAGNOSIS — I10 Essential (primary) hypertension: Secondary | ICD-10-CM | POA: Diagnosis not present

## 2017-03-27 DIAGNOSIS — Z6831 Body mass index (BMI) 31.0-31.9, adult: Secondary | ICD-10-CM | POA: Diagnosis not present

## 2017-03-27 DIAGNOSIS — Z299 Encounter for prophylactic measures, unspecified: Secondary | ICD-10-CM | POA: Diagnosis not present

## 2017-03-27 DIAGNOSIS — E1165 Type 2 diabetes mellitus with hyperglycemia: Secondary | ICD-10-CM | POA: Diagnosis not present

## 2017-04-11 DIAGNOSIS — E1165 Type 2 diabetes mellitus with hyperglycemia: Secondary | ICD-10-CM | POA: Diagnosis not present

## 2017-04-11 DIAGNOSIS — Z6831 Body mass index (BMI) 31.0-31.9, adult: Secondary | ICD-10-CM | POA: Diagnosis not present

## 2017-04-11 DIAGNOSIS — R109 Unspecified abdominal pain: Secondary | ICD-10-CM | POA: Diagnosis not present

## 2017-04-11 DIAGNOSIS — K219 Gastro-esophageal reflux disease without esophagitis: Secondary | ICD-10-CM | POA: Diagnosis not present

## 2017-04-11 DIAGNOSIS — Z299 Encounter for prophylactic measures, unspecified: Secondary | ICD-10-CM | POA: Diagnosis not present

## 2017-04-11 DIAGNOSIS — I10 Essential (primary) hypertension: Secondary | ICD-10-CM | POA: Diagnosis not present

## 2017-04-11 DIAGNOSIS — E7800 Pure hypercholesterolemia, unspecified: Secondary | ICD-10-CM | POA: Diagnosis not present

## 2017-04-22 ENCOUNTER — Encounter: Payer: Self-pay | Admitting: Gastroenterology

## 2017-04-23 ENCOUNTER — Ambulatory Visit: Payer: Self-pay | Admitting: "Endocrinology

## 2017-06-09 ENCOUNTER — Ambulatory Visit: Payer: Medicare HMO | Admitting: Nurse Practitioner

## 2017-06-09 DIAGNOSIS — Z8546 Personal history of malignant neoplasm of prostate: Secondary | ICD-10-CM | POA: Diagnosis not present

## 2017-07-03 DIAGNOSIS — I10 Essential (primary) hypertension: Secondary | ICD-10-CM | POA: Diagnosis not present

## 2017-07-03 DIAGNOSIS — Z299 Encounter for prophylactic measures, unspecified: Secondary | ICD-10-CM | POA: Diagnosis not present

## 2017-07-03 DIAGNOSIS — E1165 Type 2 diabetes mellitus with hyperglycemia: Secondary | ICD-10-CM | POA: Diagnosis not present

## 2017-07-03 DIAGNOSIS — Z6831 Body mass index (BMI) 31.0-31.9, adult: Secondary | ICD-10-CM | POA: Diagnosis not present

## 2017-07-03 DIAGNOSIS — K219 Gastro-esophageal reflux disease without esophagitis: Secondary | ICD-10-CM | POA: Diagnosis not present

## 2017-07-28 ENCOUNTER — Telehealth: Payer: Self-pay | Admitting: Cardiovascular Disease

## 2017-07-28 NOTE — Telephone Encounter (Signed)
Peter Congo (wife) called stating that patient continues to have problems with taking the carvedilol (COREG) 3.125 MG tablet   [phone] .

## 2017-07-28 NOTE — Telephone Encounter (Signed)
Stop Coreg. Can have BP and HR checked by PCP.

## 2017-07-28 NOTE — Telephone Encounter (Signed)
Wife Alfred Miller) calling - c/o lightheaded, nausea.  Does not have BP monitor at home.  Stated that he never felt this way until he started taking the Coreg.  Is taking 1/2 of the Coreg twice a day currently.  Not due back to see you till this July for 1 year follow up.

## 2017-07-29 NOTE — Telephone Encounter (Signed)
Wife (Gloris) notified.  Did just see his pmd recently.  Yearly follow up scheduled for August with Dr. Bronson Ing.  Also suggested that they buy home BP monitor for home monitoring.

## 2017-08-12 DIAGNOSIS — Z1339 Encounter for screening examination for other mental health and behavioral disorders: Secondary | ICD-10-CM | POA: Diagnosis not present

## 2017-08-12 DIAGNOSIS — Z6831 Body mass index (BMI) 31.0-31.9, adult: Secondary | ICD-10-CM | POA: Diagnosis not present

## 2017-08-12 DIAGNOSIS — E1165 Type 2 diabetes mellitus with hyperglycemia: Secondary | ICD-10-CM | POA: Diagnosis not present

## 2017-08-12 DIAGNOSIS — Z125 Encounter for screening for malignant neoplasm of prostate: Secondary | ICD-10-CM | POA: Diagnosis not present

## 2017-08-12 DIAGNOSIS — Z1211 Encounter for screening for malignant neoplasm of colon: Secondary | ICD-10-CM | POA: Diagnosis not present

## 2017-08-12 DIAGNOSIS — Z79899 Other long term (current) drug therapy: Secondary | ICD-10-CM | POA: Diagnosis not present

## 2017-08-12 DIAGNOSIS — Z299 Encounter for prophylactic measures, unspecified: Secondary | ICD-10-CM | POA: Diagnosis not present

## 2017-08-12 DIAGNOSIS — Z1331 Encounter for screening for depression: Secondary | ICD-10-CM | POA: Diagnosis not present

## 2017-08-12 DIAGNOSIS — E7800 Pure hypercholesterolemia, unspecified: Secondary | ICD-10-CM | POA: Diagnosis not present

## 2017-08-12 DIAGNOSIS — Z0000 Encounter for general adult medical examination without abnormal findings: Secondary | ICD-10-CM | POA: Diagnosis not present

## 2017-08-12 DIAGNOSIS — R5383 Other fatigue: Secondary | ICD-10-CM | POA: Diagnosis not present

## 2017-08-12 DIAGNOSIS — Z7189 Other specified counseling: Secondary | ICD-10-CM | POA: Diagnosis not present

## 2017-08-12 DIAGNOSIS — Z87891 Personal history of nicotine dependence: Secondary | ICD-10-CM | POA: Diagnosis not present

## 2017-08-12 DIAGNOSIS — I10 Essential (primary) hypertension: Secondary | ICD-10-CM | POA: Diagnosis not present

## 2017-08-15 ENCOUNTER — Ambulatory Visit (INDEPENDENT_AMBULATORY_CARE_PROVIDER_SITE_OTHER): Payer: Medicare HMO | Admitting: Urology

## 2017-08-15 DIAGNOSIS — Z8551 Personal history of malignant neoplasm of bladder: Secondary | ICD-10-CM

## 2017-08-15 DIAGNOSIS — R3915 Urgency of urination: Secondary | ICD-10-CM | POA: Diagnosis not present

## 2017-08-15 DIAGNOSIS — C61 Malignant neoplasm of prostate: Secondary | ICD-10-CM

## 2017-09-05 DIAGNOSIS — I259 Chronic ischemic heart disease, unspecified: Secondary | ICD-10-CM | POA: Diagnosis not present

## 2017-09-05 DIAGNOSIS — Z6831 Body mass index (BMI) 31.0-31.9, adult: Secondary | ICD-10-CM | POA: Diagnosis not present

## 2017-09-05 DIAGNOSIS — I10 Essential (primary) hypertension: Secondary | ICD-10-CM | POA: Diagnosis not present

## 2017-09-05 DIAGNOSIS — Z713 Dietary counseling and surveillance: Secondary | ICD-10-CM | POA: Diagnosis not present

## 2017-09-05 DIAGNOSIS — Z299 Encounter for prophylactic measures, unspecified: Secondary | ICD-10-CM | POA: Diagnosis not present

## 2017-10-06 DIAGNOSIS — H04123 Dry eye syndrome of bilateral lacrimal glands: Secondary | ICD-10-CM | POA: Diagnosis not present

## 2017-10-06 DIAGNOSIS — H11132 Conjunctival pigmentations, left eye: Secondary | ICD-10-CM | POA: Diagnosis not present

## 2017-10-06 DIAGNOSIS — H02403 Unspecified ptosis of bilateral eyelids: Secondary | ICD-10-CM | POA: Diagnosis not present

## 2017-10-06 DIAGNOSIS — H2513 Age-related nuclear cataract, bilateral: Secondary | ICD-10-CM | POA: Diagnosis not present

## 2017-10-06 DIAGNOSIS — H16223 Keratoconjunctivitis sicca, not specified as Sjogren's, bilateral: Secondary | ICD-10-CM | POA: Diagnosis not present

## 2017-10-20 DIAGNOSIS — N400 Enlarged prostate without lower urinary tract symptoms: Secondary | ICD-10-CM | POA: Diagnosis not present

## 2017-10-20 DIAGNOSIS — Z299 Encounter for prophylactic measures, unspecified: Secondary | ICD-10-CM | POA: Diagnosis not present

## 2017-10-20 DIAGNOSIS — I259 Chronic ischemic heart disease, unspecified: Secondary | ICD-10-CM | POA: Diagnosis not present

## 2017-10-20 DIAGNOSIS — Z6831 Body mass index (BMI) 31.0-31.9, adult: Secondary | ICD-10-CM | POA: Diagnosis not present

## 2017-10-20 DIAGNOSIS — E7800 Pure hypercholesterolemia, unspecified: Secondary | ICD-10-CM | POA: Diagnosis not present

## 2017-10-20 DIAGNOSIS — I10 Essential (primary) hypertension: Secondary | ICD-10-CM | POA: Diagnosis not present

## 2017-10-20 DIAGNOSIS — E1165 Type 2 diabetes mellitus with hyperglycemia: Secondary | ICD-10-CM | POA: Diagnosis not present

## 2017-10-21 ENCOUNTER — Ambulatory Visit: Payer: Medicare HMO | Admitting: Cardiovascular Disease

## 2017-11-05 ENCOUNTER — Ambulatory Visit (INDEPENDENT_AMBULATORY_CARE_PROVIDER_SITE_OTHER): Payer: Medicare HMO | Admitting: Orthopaedic Surgery

## 2017-11-05 ENCOUNTER — Ambulatory Visit (INDEPENDENT_AMBULATORY_CARE_PROVIDER_SITE_OTHER): Payer: Medicare HMO

## 2017-11-05 ENCOUNTER — Encounter (INDEPENDENT_AMBULATORY_CARE_PROVIDER_SITE_OTHER): Payer: Self-pay | Admitting: Orthopaedic Surgery

## 2017-11-05 VITALS — BP 157/99 | HR 64 | Ht 74.0 in | Wt 260.0 lb

## 2017-11-05 DIAGNOSIS — G8929 Other chronic pain: Secondary | ICD-10-CM | POA: Diagnosis not present

## 2017-11-05 DIAGNOSIS — M25562 Pain in left knee: Secondary | ICD-10-CM

## 2017-11-05 NOTE — Progress Notes (Signed)
Office Visit Note   Patient: Alfred Miller           Date of Birth: [birth year]           MRN: [id number] Visit Date: 11/05/2017              Requested by: Alfred Chroman, MD [place], Alfred Miller PCP: Alfred Chroman, MD   Assessment & Plan: Visit Diagnoses:  1. Chronic pain of left knee     Plan: End-stage osteoarthritis left knee.  Long discussion with Alfred Miller regarding the diagnosis.  Alfred Miller is an insulin-dependent diabetic.  I am concerned about cortisone.  We will try Visco supplementation.  Discussed in some detail knee replacement surgery.  Will pre-CERT Visco supplementation.  Office visit over 45 minutes regarding all of the above.  50% of the time was spent counseling and different treatment options  Follow-Up Instructions: Return in about 1 week (around 11/12/2017).   Orders:  Orders Placed This Encounter  Procedures  . XR KNEE 3 VIEW LEFT   No orders of the defined types were placed in this encounter.     Procedures: No procedures performed   Clinical Data: No additional findings.   Subjective: Chief Complaint  Patient presents with  . New Patient (Initial Visit)    L KNEE PAIN CONSTANT AND HAS HAD MULTIPLE FALLS DUE TO L KNEE GIVING AWAY AND HAS TROUBLE GETTING UP. FOR SEVERAL YRS JUST GOTTEN WORSE LAST 7 MO  Alfred Miller is 67 years old accompanied by his wife and here for evaluation of left knee pain.  He has had trouble for "a long time" but worse over the past 6 months.  He has had some difficulty walking because of his knee particularly going up and down steps.  Enhanced sensation of his knee giving way and multiple falls.  He has had some popping and even some swelling.  He has tried heat and ice, Tylenol and brace.  Sometimes he feels like the brace makes it "worse". ReTired from [facility].  He is an insulin-dependent diabetic  HPI  Review of Systems  Constitutional: Positive for fatigue.  HENT: Negative for ear pain.   Eyes: Negative for  pain.  Respiratory: Negative for cough and shortness of breath.   Cardiovascular: Positive for leg swelling.  Gastrointestinal: Negative for constipation and diarrhea.  Genitourinary: Negative for difficulty urinating.  Musculoskeletal: Negative for back pain and neck pain.  Skin: Negative for rash.  Allergic/Immunologic: Negative for food allergies.  Neurological: Positive for weakness.  Hematological: Does not bruise/bleed easily.  Psychiatric/Behavioral: Positive for suicidal ideas.     Objective: Vital Signs: BP (!) 157/99 (BP Location: Left Arm, Patient Position: Sitting, Cuff Size: Normal)   Pulse 64   Ht 6\' 2"  (1.88 m)   Wt 260 lb (117.9 kg)   BMI 33.38 kg/m   Physical Exam  Constitutional: He is oriented to person, place, and time. He appears well-developed and well-nourished.  HENT:  Mouth/Throat: Oropharynx is clear and moist.  Eyes: Pupils are equal, round, and reactive to light. EOM are normal.  Pulmonary/Chest: Effort normal.  Neurological: He is alert and oriented to person, place, and time.  Skin: Skin is warm and dry.  Psychiatric: He has a normal mood and affect. His behavior is normal.   Difficult to examine .speech is garbled Ortho Exam week alert and oriented x3.  Comfortable sitting.  Knee.  Pain with extension to but without instability left knee.  Flexed about 100 degrees.  No instability.  Tenderness about the patella and medial compartment.  Not much pain laterally.  No calf pain.  Does have burning and tingling in his feet consistent with diabetic neuropathy.  Feet were warm but I did not feel good pulses.  Some venous stasis changes distally in the leg but without edema.  Painless range of motion of both hips.  Straight leg raise negative for any back pain.  Specialty Comments:  No specialty comments available.  Imaging: Xr Knee 3 View Left  Result Date: 11/05/2017 Films of the left knee were obtained in several projections standing.  There is  complete collapse of the medial compartment with peripheral osteophytes subchondral sclerosis and subchondral cyst particularly on the femur.  Approximately 2 to 3 degrees of varus.  Diffuse calcification of the femoral popliteal and tibial arteries writes about the patellofemoral joint.  No acute changes.  No ectopic calcification.  End-stage osteoarthritis    PMFS History: Patient Active Problem List   Diagnosis Date Noted  . Ejection fraction   . Aortic root dilatation ([place])   . Hypertension   . Diabetes mellitus   . CAD (coronary artery disease)   . Ejection fraction   . Aortic valve sclerosis    Past Medical History:  Diagnosis Date  . Aortic root dilatation ([facility])    Aortic root 44 mm, echo, October 08, 2010  . Aortic valve sclerosis    No stenosis, echo, July, 2012  . BPH (benign prostatic hyperplasia)   . CAD (coronary artery disease)    Catheterization 2009, "some narrowing"  . Diabetes mellitus   . Ejection fraction    EF 60%,Echo, October 08, 2010  . Gout   . Hypertension     Family History  Problem Relation Age of Onset  . Hypertension Father   . Diabetes Father   . Hypertension Mother   . Diabetes Mother   . Breast cancer Mother     Past Surgical History:  Procedure Laterality Date  . CARDIAC CATHETERIZATION  2009  . CIRCUMCISION  09/2009   Social History   Occupational History  . Not on file  Tobacco Use  . Smoking status: Former Smoker    Packs/day: 0.30    Years: 40.00    Pack years: 12.00    Types: Cigarettes    Start date: 06/26/1968    Last attempt to quit: 03/18/2005    Years since quitting: 12.6  . Smokeless tobacco: Never Used  Substance and Sexual Activity  . Alcohol use: Not Currently    Alcohol/week: 0.0 standard drinks  . Drug use: Not Currently  . Sexual activity: Not on file

## 2017-11-12 ENCOUNTER — Ambulatory Visit (INDEPENDENT_AMBULATORY_CARE_PROVIDER_SITE_OTHER): Payer: Medicare HMO | Admitting: Orthopaedic Surgery

## 2017-11-12 ENCOUNTER — Encounter (INDEPENDENT_AMBULATORY_CARE_PROVIDER_SITE_OTHER): Payer: Self-pay | Admitting: Orthopaedic Surgery

## 2017-11-12 VITALS — BP 106/102 | HR 69 | Ht 74.0 in | Wt 260.0 lb

## 2017-11-12 DIAGNOSIS — M1712 Unilateral primary osteoarthritis, left knee: Secondary | ICD-10-CM

## 2017-11-12 DIAGNOSIS — M1711 Unilateral primary osteoarthritis, right knee: Secondary | ICD-10-CM | POA: Insufficient documentation

## 2017-11-12 MED ORDER — SODIUM HYALURONATE (VISCOSUP) 20 MG/2ML IX SOSY
20.0000 mg | PREFILLED_SYRINGE | INTRA_ARTICULAR | Status: AC | PRN
  Administered 2017-11-12: 20 mg via INTRA_ARTICULAR

## 2017-11-12 MED ORDER — LIDOCAINE HCL 1 % IJ SOLN
2.0000 mL | INTRAMUSCULAR | Status: AC | PRN
  Administered 2017-11-12: 2 mL

## 2017-11-12 NOTE — Progress Notes (Signed)
Office Visit Note   Patient: Alfred Miller           Date of Birth: 01/17/1951           MRN: [id number] Visit Date: 11/12/2017              Requested by: Glenda Chroman, MD [place],  [id number] PCP: Glenda Chroman, MD   Assessment & Plan: Visit Diagnoses:  1. Primary osteoarthritis of left knee   2. Unilateral primary osteoarthritis, left knee     Plan: First Euflexxa injection left knee  Follow-Up Instructions: Return in about 1 week (around 11/19/2017).   Orders:  Orders Placed This Encounter  Procedures  . Large Joint Inj: L knee   No orders of the defined types were placed in this encounter.     Procedures: Large Joint Inj: L knee on 11/12/2017 11:41 AM Indications: pain and joint swelling Details: 25 G 1.5 in needle, anteromedial approach  Arthrogram: No  Medications: 2 mL lidocaine 1 %; 20 mg Sodium Hyaluronate 20 MG/2ML Outcome: tolerated well, no immediate complications Procedure, treatment alternatives, risks and benefits explained, specific risks discussed. Consent was given by the patient. Immediately prior to procedure a time out was called to verify the correct patient, procedure, equipment, support staff and site/side marked as required. Patient was prepped and draped in the usual sterile fashion.       Clinical Data: No additional findings.   Subjective: Chief Complaint  Patient presents with  . Follow-up    L KNEE # 1 EUFLEXXA INJECTION    HPI  Review of Systems   Objective: Vital Signs: BP (!) 106/102 (BP Location: Left Arm, Patient Position: Sitting, Cuff Size: Normal)   Pulse 69   Ht 6\' 2"  (1.88 m)   Wt 260 lb (117.9 kg)   BMI 33.38 kg/m   Physical Exam  Ortho Exam left knee not hot red or swollen.  No effusion we will proceed with the first Euflexxa injection for his osteoarthritis  Specialty Comments:  No specialty comments available.  Imaging: No results found.   PMFS History: Patient Active Problem List   Diagnosis Date Noted  . Unilateral primary osteoarthritis, right knee 11/12/2017  . Unilateral primary osteoarthritis, left knee 11/12/2017  . Ejection fraction   . Aortic root dilatation ([place])   . Hypertension   . Diabetes mellitus   . CAD (coronary artery disease)   . Ejection fraction   . Aortic valve sclerosis    Past Medical History:  Diagnosis Date  . Aortic root dilatation (H[facility])    Aortic root 44 mm, echo, October 08, 2010  . Aortic valve sclerosis    No stenosis, echo, July, 2012  . BPH (benign prostatic hyperplasia)   . CAD (coronary artery disease)    Catheterization 2009, "some narrowing"  . Diabetes mellitus   . Ejection fraction    EF 60%,Echo, October 08, 2010  . Gout   . Hypertension     Family History  Problem Relation Age of Onset  . Hypertension Father   . Diabetes Father   . Hypertension Mother   . Diabetes Mother   . Breast cancer Mother     Past Surgical History:  Procedure Laterality Date  . CARDIAC CATHETERIZATION  2009  . CIRCUMCISION  09/2009   Social History   Occupational History  . Not on file  Tobacco Use  . Smoking status: Former Smoker    Packs/day: 0.30    Years:  40.00    Pack years: 12.00    Types: Cigarettes    Start date: 06/26/1968    Last attempt to quit: 03/18/2005    Years since quitting: 12.6  . Smokeless tobacco: Never Used  Substance and Sexual Activity  . Alcohol use: Not Currently    Alcohol/week: 0.0 standard drinks  . Drug use: Not Currently  . Sexual activity: Not on file

## 2017-11-13 DIAGNOSIS — H524 Presbyopia: Secondary | ICD-10-CM | POA: Diagnosis not present

## 2017-11-19 ENCOUNTER — Ambulatory Visit (INDEPENDENT_AMBULATORY_CARE_PROVIDER_SITE_OTHER): Payer: Medicare HMO | Admitting: Orthopaedic Surgery

## 2017-11-26 ENCOUNTER — Encounter (INDEPENDENT_AMBULATORY_CARE_PROVIDER_SITE_OTHER): Payer: Self-pay | Admitting: Orthopaedic Surgery

## 2017-11-26 ENCOUNTER — Ambulatory Visit (INDEPENDENT_AMBULATORY_CARE_PROVIDER_SITE_OTHER): Payer: Medicare HMO | Admitting: Orthopaedic Surgery

## 2017-11-26 DIAGNOSIS — M1712 Unilateral primary osteoarthritis, left knee: Secondary | ICD-10-CM | POA: Diagnosis not present

## 2017-11-26 MED ORDER — SODIUM HYALURONATE (VISCOSUP) 20 MG/2ML IX SOSY
20.0000 mg | PREFILLED_SYRINGE | INTRA_ARTICULAR | Status: AC | PRN
  Administered 2017-11-26: 20 mg via INTRA_ARTICULAR

## 2017-11-26 NOTE — Progress Notes (Signed)
Office Visit Note   Patient: Alfred Miller           Date of Birth: 09/05/1950           MRN: [id number] Visit Date: 11/26/2017              Requested by: Glenda Chroman, MD [place], [place] [address] PCP: Glenda Chroman, MD   Assessment & Plan: Visit Diagnoses:  1. Unilateral primary osteoarthritis, left knee     Plan: Second Euflexxa injection left knee.  Return next week to complete the series of 3  Follow-Up Instructions: Return in about 1 week (around 12/03/2017).   Orders:  No orders of the defined types were placed in this encounter.  No orders of the defined types were placed in this encounter.     Procedures: Large Joint Inj: L knee on 11/26/2017 11:28 AM Indications: pain and joint swelling Details: 25 G 1.5 in needle, anteromedial approach  Arthrogram: No  Medications: 20 mg Sodium Hyaluronate 20 MG/2ML Outcome: tolerated well, no immediate complications Procedure, treatment alternatives, risks and benefits explained, specific risks discussed. Consent was given by the patient. Immediately prior to procedure a time out was called to verify the correct patient, procedure, equipment, support staff and site/side marked as required. Patient was prepped and draped in the usual sterile fashion.       Clinical Data: No additional findings.   Subjective: No chief complaint on file. [place] related to first Euflexxa injection left knee last week  HPI  Review of Systems   Objective: Vital Signs: There were no vitals taken for this visit.  Physical Exam  Ortho Exam left knee not hot red warm or swollen.  Specialty Comments:  No specialty comments available.  Imaging: No results found.   PMFS History: Patient Active Problem List   Diagnosis Date Noted  . Unilateral primary osteoarthritis, right knee 11/12/2017  . Unilateral primary osteoarthritis, left knee 11/12/2017  . Ejection fraction   . Aortic root dilatation ([place])   . Hypertension     . Diabetes mellitus   . CAD (coronary artery disease)   . Ejection fraction   . Aortic valve sclerosis    Past Medical History:  Diagnosis Date  . Aortic root dilatation ([facility])    Aortic root 44 mm, echo, October 08, 2010  . Aortic valve sclerosis    No stenosis, echo, July, 2012  . BPH (benign prostatic hyperplasia)   . CAD (coronary artery disease)    Catheterization 2009, "some narrowing"  . Diabetes mellitus   . Ejection fraction    EF 60%,Echo, October 08, 2010  . Gout   . Hypertension     Family History  Problem Relation Age of Onset  . Hypertension Father   . Diabetes Father   . Hypertension Mother   . Diabetes Mother   . Breast cancer Mother     Past Surgical History:  Procedure Laterality Date  . CARDIAC CATHETERIZATION  2009  . CIRCUMCISION  09/2009   Social History   Occupational History  . Not on file  Tobacco Use  . Smoking status: Former Smoker    Packs/day: 0.30    Years: 40.00    Pack years: 12.00    Types: Cigarettes    Start date: 06/26/1968    Last attempt to quit: 03/18/2005    Years since quitting: 12.7  . Smokeless tobacco: Never Used  Substance and Sexual Activity  . Alcohol use: Not Currently  Alcohol/week: 0.0 standard drinks  . Drug use: Not Currently  . Sexual activity: Not on file     Garald Balding, MD   Note - This record has been created using [employer].  Chart creation errors have been sought, but may not always  have been located. Such creation errors do not reflect on  the standard of medical care.

## 2017-12-03 ENCOUNTER — Encounter (INDEPENDENT_AMBULATORY_CARE_PROVIDER_SITE_OTHER): Payer: Self-pay | Admitting: Orthopedic Surgery

## 2017-12-03 ENCOUNTER — Ambulatory Visit (INDEPENDENT_AMBULATORY_CARE_PROVIDER_SITE_OTHER): Payer: Medicare HMO | Admitting: Orthopedic Surgery

## 2017-12-03 VITALS — BP 156/97 | HR 69 | Ht 74.0 in | Wt 250.0 lb

## 2017-12-03 DIAGNOSIS — M1712 Unilateral primary osteoarthritis, left knee: Secondary | ICD-10-CM | POA: Diagnosis not present

## 2017-12-03 MED ORDER — SODIUM HYALURONATE (VISCOSUP) 20 MG/2ML IX SOSY
20.0000 mg | PREFILLED_SYRINGE | INTRA_ARTICULAR | Status: AC | PRN
  Administered 2017-12-03: 20 mg via INTRA_ARTICULAR

## 2017-12-03 MED ORDER — LIDOCAINE HCL 1 % IJ SOLN
2.0000 mL | INTRAMUSCULAR | Status: AC | PRN
  Administered 2017-12-03: 2 mL

## 2017-12-03 MED ORDER — TRAMADOL HCL 50 MG PO TABS: 50.0000 mg | ORAL_TABLET | Freq: Two times a day (BID) | ORAL | 0 refills | Status: DC | PRN

## 2017-12-03 NOTE — Progress Notes (Deleted)
Office Visit Note   Patient: Alfred Miller           Date of Birth: 22-Apr-1950           MRN: [id number] Visit Date: 12/03/2017              Requested by: Glenda Chroman, MD [place], [place] [address] PCP: Glenda Chroman, MD   Assessment & Plan: Visit Diagnoses: No diagnosis found.  Plan: ***  Follow-Up Instructions: No follow-ups on file.   Orders:  No orders of the defined types were placed in this encounter.  No orders of the defined types were placed in this encounter.     Procedures: No procedures performed   Clinical Data: No additional findings.   Subjective: Chief Complaint  Patient presents with  . Follow-up    # 3 left knee euflexxa, gives away and causes him to fall, cant get up. knee hurts worse after shots    HPI  Review of Systems  Constitutional: Negative for fatigue and fever.  HENT: Negative for ear pain.   Eyes: Negative for pain.  Respiratory: Negative for cough and shortness of breath.   Cardiovascular: Negative for leg swelling.  Gastrointestinal: Negative for constipation and diarrhea.  Genitourinary: Negative for difficulty urinating.  Musculoskeletal: Negative for back pain and neck pain.  Skin: Negative for rash.  Allergic/Immunologic: Negative for food allergies.  Neurological: Positive for weakness. Negative for numbness.  Hematological: Does not bruise/bleed easily.  Psychiatric/Behavioral: Negative for sleep disturbance.     Objective: Vital Signs: There were no vitals taken for this visit.  Physical Exam  Ortho Exam  Specialty Comments:  No specialty comments available.  Imaging: No results found.   PMFS History: Patient Active Problem List   Diagnosis Date Noted  . Unilateral primary osteoarthritis, right knee 11/12/2017  . Unilateral primary osteoarthritis, left knee 11/12/2017  . Ejection fraction   . Aortic root dilatation ([place])   . Hypertension   . Diabetes mellitus   . CAD (coronary artery  disease)   . Ejection fraction   . Aortic valve sclerosis    Past Medical History:  Diagnosis Date  . Aortic root dilatation ([facility])    Aortic root 44 mm, echo, October 08, 2010  . Aortic valve sclerosis    No stenosis, echo, July, 2012  . BPH (benign prostatic hyperplasia)   . CAD (coronary artery disease)    Catheterization 2009, "some narrowing"  . Diabetes mellitus   . Ejection fraction    EF 60%,Echo, October 08, 2010  . Gout   . Hypertension     Family History  Problem Relation Age of Onset  . Hypertension Father   . Diabetes Father   . Hypertension Mother   . Diabetes Mother   . Breast cancer Mother     Past Surgical History:  Procedure Laterality Date  . CARDIAC CATHETERIZATION  2009  . CIRCUMCISION  09/2009   Social History   Occupational History  . Not on file  Tobacco Use  . Smoking status: Former Smoker    Packs/day: 0.30    Years: 40.00    Pack years: 12.00    Types: Cigarettes    Start date: 06/26/1968    Last attempt to quit: 03/18/2005    Years since quitting: 12.7  . Smokeless tobacco: Never Used  Substance and Sexual Activity  . Alcohol use: Not Currently    Alcohol/week: 0.0 standard drinks  . Drug use: Not Currently  .  Sexual activity: Not on file

## 2017-12-03 NOTE — Progress Notes (Signed)
Office Visit Note   Patient: Alfred Miller           Date of Birth: November 13, 1950           MRN: [id number] Visit Date: 12/03/2017              Requested by: Glenda Chroman, MD [place], [place] [address] PCP: Glenda Chroman, MD   Assessment & Plan: Visit Diagnoses:  1. Unilateral primary osteoarthritis, left knee     Plan: #1 :  Third Euflexxa injection was given without difficulty to the left knee. #2: Prescription written for tramadol was given for his nighttime pain. #3: Follow-up with Dr. Woody Seller evaluation of peripheral neuropathy tonic pain syndrome  Follow-Up Instructions: No follow-ups on file.   Orders:  No orders of the defined types were placed in this encounter.  Meds ordered this encounter  Medications  . traMADol (ULTRAM) 50 MG tablet    Sig: Take 1 tablet (50 mg total) by mouth every 12 (twelve) hours as needed.    Dispense:  20 tablet    Refill:  0    Order Specific Question:   Supervising Provider    Answer:   Garald Balding [[id number]]      Procedures: Large Joint Inj: L knee on 12/03/2017 9:50 AM Indications: pain and joint swelling Details: 25 G 1.5 in needle, medial approach  Arthrogram: No  Medications: 2 mL lidocaine 1 %; 20 mg Sodium Hyaluronate 20 MG/2ML Outcome: tolerated well, no immediate complications Procedure, treatment alternatives, risks and benefits explained, specific risks discussed. Consent was given by the patient. Immediately prior to procedure a time out was called to verify the correct patient, procedure, equipment, support staff and site/side marked as required. Patient was prepped and draped in the usual sterile fashion.       Clinical Data: No additional findings.   Subjective: Chief Complaint  Patient presents with  . Follow-up    # 3 left knee euflexxa, gives away and causes him to fall, cant get up. knee hurts worse after shots    HPI  Jovonni returns today for his third Euflexxa injection.  He states though is  not very helpful.  He is has episodes of giving way which is causing him to fall.  Also is describing symptoms of numbness and tingling in his feet and legs as well as nighttime pain and jumping of the legs.  Review of Systems  Constitutional: Positive for fatigue.  HENT: Negative for ear pain.   Eyes: Negative for pain.  Respiratory: Negative for cough and shortness of breath.   Cardiovascular: Positive for leg swelling.  Gastrointestinal: Negative for constipation and diarrhea.  Genitourinary: Negative for difficulty urinating.  Musculoskeletal: Negative for back pain and neck pain.  Skin: Negative for rash.  Allergic/Immunologic: Negative for food allergies.  Neurological: Positive for weakness.  Hematological: Does not bruise/bleed easily.  Psychiatric/Behavioral: Positive for suicidal ideas.     Objective: Vital Signs: BP (!) 156/97 (BP Location: Left Arm, Patient Position: Sitting, Cuff Size: Normal)   Pulse 69   Ht 6\' 2"  (1.88 m)   Wt 250 lb (113.4 kg)   BMI 32.10 kg/m   Physical Exam  Ortho Exam  Exam today reveals need to be quite benign.  No real effusion.  No warmth or erythema noted.  Specialty Comments:  No specialty comments available.  Imaging: No results found.   PMFS History: Current Outpatient Medications  Medication Sig Dispense Refill  . amLODipine (  NORVASC) 10 MG tablet     . aspirin EC 81 MG tablet Take 81 mg by mouth daily. Reported on 10/06/2015    . bicalutamide (CASODEX) 50 MG tablet Take 50 mg by mouth daily.    . cloNIDine (CATAPRES) 0.2 MG tablet Take 0.2 mg by mouth 2 (two) times daily.  3  . glipiZIDE (GLUCOTROL) 10 MG tablet Take 1 tablet by mouth Twice daily.    . Insulin Glargine (TOUJEO SOLOSTAR ) Inject into the skin as directed.    Marland Kitchen lisinopril (PRINIVIL,ZESTRIL) 40 MG tablet     . neomycin-polymyxin b-dexamethasone (MAXITROL) 3.5-10000-0.1 OINT APPLY A SMALL AMOUNT OF OINTMENT TWICE DAILY TO EYELID  0  . TRESIBA FLEXTOUCH 200  UNIT/ML SOPN INJECT 80 UNITS SUBCUTANEOUSLY ONCE DAILY MAXIMUM DAILY DOSE OF 100 UNITS  12  . traMADol (ULTRAM) 50 MG tablet Take 1 tablet (50 mg total) by mouth every 12 (twelve) hours as needed. 20 tablet 0   No current facility-administered medications for this visit.     Patient Active Problem List   Diagnosis Date Noted  . Unilateral primary osteoarthritis, right knee 11/12/2017  . Unilateral primary osteoarthritis, left knee 11/12/2017  . Ejection fraction   . Aortic root dilatation ([place])   . Hypertension   . Diabetes mellitus   . CAD (coronary artery disease)   . Ejection fraction   . Aortic valve sclerosis    Past Medical History:  Diagnosis Date  . Aortic root dilatation ([facility])    Aortic root 44 mm, echo, October 08, 2010  . Aortic valve sclerosis    No stenosis, echo, July, 2012  . BPH (benign prostatic hyperplasia)   . CAD (coronary artery disease)    Catheterization 2009, "some narrowing"  . Diabetes mellitus   . Ejection fraction    EF 60%,Echo, October 08, 2010  . Gout   . Hypertension     Family History  Problem Relation Age of Onset  . Hypertension Father   . Diabetes Father   . Hypertension Mother   . Diabetes Mother   . Breast cancer Mother     Past Surgical History:  Procedure Laterality Date  . CARDIAC CATHETERIZATION  2009  . CIRCUMCISION  09/2009   Social History   Occupational History  . Not on file  Tobacco Use  . Smoking status: Former Smoker    Packs/day: 0.30    Years: 40.00    Pack years: 12.00    Types: Cigarettes    Start date: 06/26/1968    Last attempt to quit: 03/18/2005    Years since quitting: 12.7  . Smokeless tobacco: Never Used  Substance and Sexual Activity  . Alcohol use: Not Currently    Alcohol/week: 0.0 standard drinks  . Drug use: Not Currently  . Sexual activity: Not on file

## 2017-12-12 DIAGNOSIS — Z713 Dietary counseling and surveillance: Secondary | ICD-10-CM | POA: Diagnosis not present

## 2017-12-12 DIAGNOSIS — Z6831 Body mass index (BMI) 31.0-31.9, adult: Secondary | ICD-10-CM | POA: Diagnosis not present

## 2017-12-12 DIAGNOSIS — I10 Essential (primary) hypertension: Secondary | ICD-10-CM | POA: Diagnosis not present

## 2017-12-12 DIAGNOSIS — Z299 Encounter for prophylactic measures, unspecified: Secondary | ICD-10-CM | POA: Diagnosis not present

## 2017-12-12 DIAGNOSIS — E1165 Type 2 diabetes mellitus with hyperglycemia: Secondary | ICD-10-CM | POA: Diagnosis not present

## 2018-01-02 ENCOUNTER — Ambulatory Visit: Payer: Medicare HMO | Admitting: Cardiovascular Disease

## 2018-01-02 ENCOUNTER — Encounter

## 2018-01-22 DIAGNOSIS — I10 Essential (primary) hypertension: Secondary | ICD-10-CM | POA: Diagnosis not present

## 2018-01-22 DIAGNOSIS — M47816 Spondylosis without myelopathy or radiculopathy, lumbar region: Secondary | ICD-10-CM | POA: Diagnosis not present

## 2018-01-22 DIAGNOSIS — R109 Unspecified abdominal pain: Secondary | ICD-10-CM | POA: Diagnosis not present

## 2018-01-22 DIAGNOSIS — M25551 Pain in right hip: Secondary | ICD-10-CM | POA: Diagnosis not present

## 2018-01-22 DIAGNOSIS — Z6831 Body mass index (BMI) 31.0-31.9, adult: Secondary | ICD-10-CM | POA: Diagnosis not present

## 2018-01-22 DIAGNOSIS — M160 Bilateral primary osteoarthritis of hip: Secondary | ICD-10-CM | POA: Diagnosis not present

## 2018-01-22 DIAGNOSIS — Z299 Encounter for prophylactic measures, unspecified: Secondary | ICD-10-CM | POA: Diagnosis not present

## 2018-01-22 DIAGNOSIS — R1031 Right lower quadrant pain: Secondary | ICD-10-CM | POA: Diagnosis not present

## 2018-01-26 DIAGNOSIS — Z7984 Long term (current) use of oral hypoglycemic drugs: Secondary | ICD-10-CM | POA: Diagnosis not present

## 2018-01-26 DIAGNOSIS — I700 Atherosclerosis of aorta: Secondary | ICD-10-CM | POA: Diagnosis not present

## 2018-01-26 DIAGNOSIS — N281 Cyst of kidney, acquired: Secondary | ICD-10-CM | POA: Diagnosis not present

## 2018-01-26 DIAGNOSIS — M549 Dorsalgia, unspecified: Secondary | ICD-10-CM | POA: Diagnosis not present

## 2018-01-26 DIAGNOSIS — R1031 Right lower quadrant pain: Secondary | ICD-10-CM | POA: Diagnosis not present

## 2018-01-26 DIAGNOSIS — Z299 Encounter for prophylactic measures, unspecified: Secondary | ICD-10-CM | POA: Diagnosis not present

## 2018-01-26 DIAGNOSIS — D1779 Benign lipomatous neoplasm of other sites: Secondary | ICD-10-CM | POA: Diagnosis not present

## 2018-01-26 DIAGNOSIS — R109 Unspecified abdominal pain: Secondary | ICD-10-CM | POA: Diagnosis not present

## 2018-01-26 DIAGNOSIS — E119 Type 2 diabetes mellitus without complications: Secondary | ICD-10-CM | POA: Diagnosis not present

## 2018-01-26 DIAGNOSIS — K760 Fatty (change of) liver, not elsewhere classified: Secondary | ICD-10-CM | POA: Diagnosis not present

## 2018-01-26 DIAGNOSIS — E1165 Type 2 diabetes mellitus with hyperglycemia: Secondary | ICD-10-CM | POA: Diagnosis not present

## 2018-01-26 DIAGNOSIS — Z6831 Body mass index (BMI) 31.0-31.9, adult: Secondary | ICD-10-CM | POA: Diagnosis not present

## 2018-01-26 DIAGNOSIS — N400 Enlarged prostate without lower urinary tract symptoms: Secondary | ICD-10-CM | POA: Diagnosis not present

## 2018-01-26 DIAGNOSIS — I10 Essential (primary) hypertension: Secondary | ICD-10-CM | POA: Diagnosis not present

## 2018-01-28 DIAGNOSIS — I10 Essential (primary) hypertension: Secondary | ICD-10-CM | POA: Diagnosis not present

## 2018-01-28 DIAGNOSIS — R109 Unspecified abdominal pain: Secondary | ICD-10-CM | POA: Diagnosis not present

## 2018-01-28 DIAGNOSIS — E1165 Type 2 diabetes mellitus with hyperglycemia: Secondary | ICD-10-CM | POA: Diagnosis not present

## 2018-01-28 DIAGNOSIS — Z6831 Body mass index (BMI) 31.0-31.9, adult: Secondary | ICD-10-CM | POA: Diagnosis not present

## 2018-01-28 DIAGNOSIS — Z299 Encounter for prophylactic measures, unspecified: Secondary | ICD-10-CM | POA: Diagnosis not present

## 2018-01-28 DIAGNOSIS — M159 Polyosteoarthritis, unspecified: Secondary | ICD-10-CM | POA: Diagnosis not present

## 2018-01-30 DIAGNOSIS — K219 Gastro-esophageal reflux disease without esophagitis: Secondary | ICD-10-CM | POA: Diagnosis not present

## 2018-01-30 DIAGNOSIS — E1165 Type 2 diabetes mellitus with hyperglycemia: Secondary | ICD-10-CM | POA: Diagnosis not present

## 2018-01-30 DIAGNOSIS — Z299 Encounter for prophylactic measures, unspecified: Secondary | ICD-10-CM | POA: Diagnosis not present

## 2018-01-30 DIAGNOSIS — I10 Essential (primary) hypertension: Secondary | ICD-10-CM | POA: Diagnosis not present

## 2018-01-30 DIAGNOSIS — Z6831 Body mass index (BMI) 31.0-31.9, adult: Secondary | ICD-10-CM | POA: Diagnosis not present

## 2018-02-06 DIAGNOSIS — E1165 Type 2 diabetes mellitus with hyperglycemia: Secondary | ICD-10-CM | POA: Diagnosis not present

## 2018-02-06 DIAGNOSIS — Z299 Encounter for prophylactic measures, unspecified: Secondary | ICD-10-CM | POA: Diagnosis not present

## 2018-02-06 DIAGNOSIS — E7800 Pure hypercholesterolemia, unspecified: Secondary | ICD-10-CM | POA: Diagnosis not present

## 2018-02-06 DIAGNOSIS — I10 Essential (primary) hypertension: Secondary | ICD-10-CM | POA: Diagnosis not present

## 2018-02-09 DIAGNOSIS — C61 Malignant neoplasm of prostate: Secondary | ICD-10-CM | POA: Diagnosis not present

## 2018-02-20 ENCOUNTER — Ambulatory Visit: Payer: Medicare HMO | Admitting: Urology

## 2018-02-20 DIAGNOSIS — Z8546 Personal history of malignant neoplasm of prostate: Secondary | ICD-10-CM | POA: Diagnosis not present

## 2018-02-20 DIAGNOSIS — R3915 Urgency of urination: Secondary | ICD-10-CM

## 2018-02-20 DIAGNOSIS — Z8551 Personal history of malignant neoplasm of bladder: Secondary | ICD-10-CM

## 2018-03-05 DIAGNOSIS — Z6830 Body mass index (BMI) 30.0-30.9, adult: Secondary | ICD-10-CM | POA: Diagnosis not present

## 2018-03-05 DIAGNOSIS — Z299 Encounter for prophylactic measures, unspecified: Secondary | ICD-10-CM | POA: Diagnosis not present

## 2018-03-05 DIAGNOSIS — I10 Essential (primary) hypertension: Secondary | ICD-10-CM | POA: Diagnosis not present

## 2018-03-05 DIAGNOSIS — E1165 Type 2 diabetes mellitus with hyperglycemia: Secondary | ICD-10-CM | POA: Diagnosis not present

## 2018-04-06 ENCOUNTER — Telehealth: Payer: Self-pay | Admitting: Cardiovascular Disease

## 2018-04-06 NOTE — Telephone Encounter (Signed)
Alfred Miller (wife) called requesting to cancel appointment for tomorrow. She states that PCP told patient to discontinue taking a medication that he was put on. Patient wants to discuss.

## 2018-04-07 ENCOUNTER — Ambulatory Visit: Payer: Medicare HMO | Admitting: Cardiovascular Disease

## 2018-04-10 NOTE — Telephone Encounter (Signed)
2nd attempt to contact patient     Left message to return call

## 2018-04-13 NOTE — Telephone Encounter (Signed)
No return call back to present date.

## 2018-05-14 DIAGNOSIS — E1165 Type 2 diabetes mellitus with hyperglycemia: Secondary | ICD-10-CM | POA: Diagnosis not present

## 2018-05-14 DIAGNOSIS — Z6833 Body mass index (BMI) 33.0-33.9, adult: Secondary | ICD-10-CM | POA: Diagnosis not present

## 2018-05-14 DIAGNOSIS — Z299 Encounter for prophylactic measures, unspecified: Secondary | ICD-10-CM | POA: Diagnosis not present

## 2018-05-14 DIAGNOSIS — I10 Essential (primary) hypertension: Secondary | ICD-10-CM | POA: Diagnosis not present

## 2018-06-02 ENCOUNTER — Telehealth: Payer: Self-pay | Admitting: *Deleted

## 2018-06-02 NOTE — Telephone Encounter (Signed)
Pt wife says he has been taking coreg by mistake and wanted to make sure we were not sending it in, suggested she check on the medication bottle and it says Dr Woody Seller was last to refill - we had stopped this medication May 2019 for side effects - was a NS from January and declined to make appt at this time - says she will contact pharmacy and have them take coreg off pt profile

## 2018-07-13 DIAGNOSIS — Z713 Dietary counseling and surveillance: Secondary | ICD-10-CM | POA: Diagnosis not present

## 2018-07-13 DIAGNOSIS — Z6830 Body mass index (BMI) 30.0-30.9, adult: Secondary | ICD-10-CM | POA: Diagnosis not present

## 2018-07-13 DIAGNOSIS — E1165 Type 2 diabetes mellitus with hyperglycemia: Secondary | ICD-10-CM | POA: Diagnosis not present

## 2018-07-13 DIAGNOSIS — Z299 Encounter for prophylactic measures, unspecified: Secondary | ICD-10-CM | POA: Diagnosis not present

## 2018-07-13 DIAGNOSIS — I10 Essential (primary) hypertension: Secondary | ICD-10-CM | POA: Diagnosis not present

## 2018-07-21 ENCOUNTER — Other Ambulatory Visit (INDEPENDENT_AMBULATORY_CARE_PROVIDER_SITE_OTHER): Payer: Self-pay | Admitting: Orthopedic Surgery

## 2018-08-13 DIAGNOSIS — I10 Essential (primary) hypertension: Secondary | ICD-10-CM | POA: Diagnosis not present

## 2018-08-18 DIAGNOSIS — Z79899 Other long term (current) drug therapy: Secondary | ICD-10-CM | POA: Diagnosis not present

## 2018-08-18 DIAGNOSIS — Z125 Encounter for screening for malignant neoplasm of prostate: Secondary | ICD-10-CM | POA: Diagnosis not present

## 2018-08-18 DIAGNOSIS — Z0000 Encounter for general adult medical examination without abnormal findings: Secondary | ICD-10-CM | POA: Diagnosis not present

## 2018-08-18 DIAGNOSIS — Z7189 Other specified counseling: Secondary | ICD-10-CM | POA: Diagnosis not present

## 2018-08-18 DIAGNOSIS — Z6830 Body mass index (BMI) 30.0-30.9, adult: Secondary | ICD-10-CM | POA: Diagnosis not present

## 2018-08-18 DIAGNOSIS — Z299 Encounter for prophylactic measures, unspecified: Secondary | ICD-10-CM | POA: Diagnosis not present

## 2018-08-18 DIAGNOSIS — I10 Essential (primary) hypertension: Secondary | ICD-10-CM | POA: Diagnosis not present

## 2018-08-18 DIAGNOSIS — E7800 Pure hypercholesterolemia, unspecified: Secondary | ICD-10-CM | POA: Diagnosis not present

## 2018-08-18 DIAGNOSIS — Z1211 Encounter for screening for malignant neoplasm of colon: Secondary | ICD-10-CM | POA: Diagnosis not present

## 2018-08-18 DIAGNOSIS — Z1339 Encounter for screening examination for other mental health and behavioral disorders: Secondary | ICD-10-CM | POA: Diagnosis not present

## 2018-08-18 DIAGNOSIS — R5383 Other fatigue: Secondary | ICD-10-CM | POA: Diagnosis not present

## 2018-08-18 DIAGNOSIS — Z1331 Encounter for screening for depression: Secondary | ICD-10-CM | POA: Diagnosis not present

## 2018-09-03 DIAGNOSIS — I10 Essential (primary) hypertension: Secondary | ICD-10-CM | POA: Diagnosis not present

## 2018-09-04 DIAGNOSIS — I10 Essential (primary) hypertension: Secondary | ICD-10-CM | POA: Diagnosis not present

## 2018-09-04 DIAGNOSIS — Z6831 Body mass index (BMI) 31.0-31.9, adult: Secondary | ICD-10-CM | POA: Diagnosis not present

## 2018-09-04 DIAGNOSIS — Z299 Encounter for prophylactic measures, unspecified: Secondary | ICD-10-CM | POA: Diagnosis not present

## 2018-09-04 DIAGNOSIS — E1165 Type 2 diabetes mellitus with hyperglycemia: Secondary | ICD-10-CM | POA: Diagnosis not present

## 2018-09-04 DIAGNOSIS — Z713 Dietary counseling and surveillance: Secondary | ICD-10-CM | POA: Diagnosis not present

## 2018-09-09 DIAGNOSIS — N400 Enlarged prostate without lower urinary tract symptoms: Secondary | ICD-10-CM | POA: Diagnosis not present

## 2018-09-09 DIAGNOSIS — I160 Hypertensive urgency: Secondary | ICD-10-CM | POA: Diagnosis not present

## 2018-09-09 DIAGNOSIS — G629 Polyneuropathy, unspecified: Secondary | ICD-10-CM | POA: Diagnosis not present

## 2018-09-09 DIAGNOSIS — I259 Chronic ischemic heart disease, unspecified: Secondary | ICD-10-CM | POA: Diagnosis not present

## 2018-09-09 DIAGNOSIS — R0602 Shortness of breath: Secondary | ICD-10-CM | POA: Diagnosis not present

## 2018-09-09 DIAGNOSIS — I10 Essential (primary) hypertension: Secondary | ICD-10-CM | POA: Diagnosis not present

## 2018-09-09 DIAGNOSIS — R110 Nausea: Secondary | ICD-10-CM | POA: Diagnosis not present

## 2018-09-09 DIAGNOSIS — Z87891 Personal history of nicotine dependence: Secondary | ICD-10-CM | POA: Diagnosis not present

## 2018-09-09 DIAGNOSIS — R079 Chest pain, unspecified: Secondary | ICD-10-CM | POA: Diagnosis not present

## 2018-09-09 DIAGNOSIS — R0789 Other chest pain: Secondary | ICD-10-CM | POA: Diagnosis not present

## 2018-09-09 DIAGNOSIS — Z9114 Patient's other noncompliance with medication regimen: Secondary | ICD-10-CM | POA: Diagnosis not present

## 2018-09-09 DIAGNOSIS — E1165 Type 2 diabetes mellitus with hyperglycemia: Secondary | ICD-10-CM | POA: Diagnosis not present

## 2018-09-10 DIAGNOSIS — Z9114 Patient's other noncompliance with medication regimen: Secondary | ICD-10-CM | POA: Diagnosis not present

## 2018-09-10 DIAGNOSIS — E1165 Type 2 diabetes mellitus with hyperglycemia: Secondary | ICD-10-CM | POA: Diagnosis not present

## 2018-09-10 DIAGNOSIS — R079 Chest pain, unspecified: Secondary | ICD-10-CM | POA: Diagnosis not present

## 2018-09-10 DIAGNOSIS — R0602 Shortness of breath: Secondary | ICD-10-CM | POA: Diagnosis not present

## 2018-09-11 DIAGNOSIS — Z299 Encounter for prophylactic measures, unspecified: Secondary | ICD-10-CM | POA: Diagnosis not present

## 2018-09-11 DIAGNOSIS — Z09 Encounter for follow-up examination after completed treatment for conditions other than malignant neoplasm: Secondary | ICD-10-CM | POA: Diagnosis not present

## 2018-09-11 DIAGNOSIS — I10 Essential (primary) hypertension: Secondary | ICD-10-CM | POA: Diagnosis not present

## 2018-09-11 DIAGNOSIS — Z6831 Body mass index (BMI) 31.0-31.9, adult: Secondary | ICD-10-CM | POA: Diagnosis not present

## 2018-09-11 DIAGNOSIS — E1165 Type 2 diabetes mellitus with hyperglycemia: Secondary | ICD-10-CM | POA: Diagnosis not present

## 2018-09-11 DIAGNOSIS — I77810 Thoracic aortic ectasia: Secondary | ICD-10-CM | POA: Diagnosis not present

## 2018-09-17 DIAGNOSIS — E119 Type 2 diabetes mellitus without complications: Secondary | ICD-10-CM | POA: Diagnosis not present

## 2018-09-17 DIAGNOSIS — I10 Essential (primary) hypertension: Secondary | ICD-10-CM | POA: Diagnosis not present

## 2018-09-17 DIAGNOSIS — E7800 Pure hypercholesterolemia, unspecified: Secondary | ICD-10-CM | POA: Diagnosis not present

## 2018-10-15 DIAGNOSIS — I10 Essential (primary) hypertension: Secondary | ICD-10-CM | POA: Diagnosis not present

## 2018-10-27 DIAGNOSIS — I10 Essential (primary) hypertension: Secondary | ICD-10-CM | POA: Diagnosis not present

## 2018-10-27 DIAGNOSIS — M1710 Unilateral primary osteoarthritis, unspecified knee: Secondary | ICD-10-CM | POA: Diagnosis not present

## 2018-10-27 DIAGNOSIS — Z6831 Body mass index (BMI) 31.0-31.9, adult: Secondary | ICD-10-CM | POA: Diagnosis not present

## 2018-10-27 DIAGNOSIS — Z299 Encounter for prophylactic measures, unspecified: Secondary | ICD-10-CM | POA: Diagnosis not present

## 2018-10-27 DIAGNOSIS — E1165 Type 2 diabetes mellitus with hyperglycemia: Secondary | ICD-10-CM | POA: Diagnosis not present

## 2018-10-27 DIAGNOSIS — I77810 Thoracic aortic ectasia: Secondary | ICD-10-CM | POA: Diagnosis not present

## 2018-11-02 DIAGNOSIS — E7800 Pure hypercholesterolemia, unspecified: Secondary | ICD-10-CM | POA: Diagnosis not present

## 2018-11-02 DIAGNOSIS — E119 Type 2 diabetes mellitus without complications: Secondary | ICD-10-CM | POA: Diagnosis not present

## 2018-11-02 DIAGNOSIS — I10 Essential (primary) hypertension: Secondary | ICD-10-CM | POA: Diagnosis not present

## 2018-11-11 DIAGNOSIS — I10 Essential (primary) hypertension: Secondary | ICD-10-CM | POA: Diagnosis not present

## 2018-12-02 ENCOUNTER — Other Ambulatory Visit: Payer: Self-pay

## 2018-12-02 DIAGNOSIS — E7800 Pure hypercholesterolemia, unspecified: Secondary | ICD-10-CM | POA: Diagnosis not present

## 2018-12-02 DIAGNOSIS — I10 Essential (primary) hypertension: Secondary | ICD-10-CM | POA: Diagnosis not present

## 2018-12-02 DIAGNOSIS — E119 Type 2 diabetes mellitus without complications: Secondary | ICD-10-CM | POA: Diagnosis not present

## 2018-12-11 DIAGNOSIS — I10 Essential (primary) hypertension: Secondary | ICD-10-CM | POA: Diagnosis not present

## 2018-12-31 DIAGNOSIS — I10 Essential (primary) hypertension: Secondary | ICD-10-CM | POA: Diagnosis not present

## 2019-01-07 DIAGNOSIS — I10 Essential (primary) hypertension: Secondary | ICD-10-CM | POA: Diagnosis not present

## 2019-01-07 DIAGNOSIS — E7800 Pure hypercholesterolemia, unspecified: Secondary | ICD-10-CM | POA: Diagnosis not present

## 2019-01-07 DIAGNOSIS — E119 Type 2 diabetes mellitus without complications: Secondary | ICD-10-CM | POA: Diagnosis not present

## 2019-02-03 DIAGNOSIS — I10 Essential (primary) hypertension: Secondary | ICD-10-CM | POA: Diagnosis not present

## 2019-02-03 DIAGNOSIS — Z299 Encounter for prophylactic measures, unspecified: Secondary | ICD-10-CM | POA: Diagnosis not present

## 2019-02-03 DIAGNOSIS — Z6833 Body mass index (BMI) 33.0-33.9, adult: Secondary | ICD-10-CM | POA: Diagnosis not present

## 2019-02-03 DIAGNOSIS — E1165 Type 2 diabetes mellitus with hyperglycemia: Secondary | ICD-10-CM | POA: Diagnosis not present

## 2019-02-03 DIAGNOSIS — G629 Polyneuropathy, unspecified: Secondary | ICD-10-CM | POA: Diagnosis not present

## 2019-02-10 DIAGNOSIS — E7800 Pure hypercholesterolemia, unspecified: Secondary | ICD-10-CM | POA: Diagnosis not present

## 2019-02-10 DIAGNOSIS — I10 Essential (primary) hypertension: Secondary | ICD-10-CM | POA: Diagnosis not present

## 2019-02-10 DIAGNOSIS — E119 Type 2 diabetes mellitus without complications: Secondary | ICD-10-CM | POA: Diagnosis not present

## 2019-02-12 DIAGNOSIS — I10 Essential (primary) hypertension: Secondary | ICD-10-CM | POA: Diagnosis not present

## 2019-02-19 DIAGNOSIS — E7800 Pure hypercholesterolemia, unspecified: Secondary | ICD-10-CM | POA: Diagnosis not present

## 2019-02-19 DIAGNOSIS — I10 Essential (primary) hypertension: Secondary | ICD-10-CM | POA: Diagnosis not present

## 2019-02-19 DIAGNOSIS — H5200 Hypermetropia, unspecified eye: Secondary | ICD-10-CM | POA: Diagnosis not present

## 2019-02-19 DIAGNOSIS — E109 Type 1 diabetes mellitus without complications: Secondary | ICD-10-CM | POA: Diagnosis not present

## 2019-02-19 DIAGNOSIS — E103293 Type 1 diabetes mellitus with mild nonproliferative diabetic retinopathy without macular edema, bilateral: Secondary | ICD-10-CM | POA: Diagnosis not present

## 2019-02-19 DIAGNOSIS — Z0100 Encounter for examination of eyes and vision without abnormal findings: Secondary | ICD-10-CM | POA: Diagnosis not present

## 2019-03-18 DIAGNOSIS — I10 Essential (primary) hypertension: Secondary | ICD-10-CM | POA: Diagnosis not present

## 2019-03-30 ENCOUNTER — Telehealth: Payer: Self-pay | Admitting: Urology

## 2019-03-30 ENCOUNTER — Other Ambulatory Visit: Payer: Self-pay

## 2019-03-30 DIAGNOSIS — C61 Malignant neoplasm of prostate: Secondary | ICD-10-CM

## 2019-03-30 NOTE — Telephone Encounter (Signed)
Order placed

## 2019-03-30 NOTE — Telephone Encounter (Signed)
Pt is coming by before his appt 04/02/19. His lab orders need to be updated, he is overdue.

## 2019-03-31 ENCOUNTER — Other Ambulatory Visit: Payer: Self-pay | Admitting: Urology

## 2019-03-31 DIAGNOSIS — C61 Malignant neoplasm of prostate: Secondary | ICD-10-CM | POA: Diagnosis not present

## 2019-03-31 LAB — TESTOSTERONE: Testosterone: 64 ng/dL — ABNORMAL LOW (ref 250–827)

## 2019-03-31 LAB — PSA: PSA: <0.1 ng/mL (ref <=4.0)

## 2019-04-01 ENCOUNTER — Other Ambulatory Visit: Payer: Self-pay | Admitting: Urology

## 2019-04-01 DIAGNOSIS — I10 Essential (primary) hypertension: Secondary | ICD-10-CM | POA: Diagnosis not present

## 2019-04-01 DIAGNOSIS — E7800 Pure hypercholesterolemia, unspecified: Secondary | ICD-10-CM | POA: Diagnosis not present

## 2019-04-01 DIAGNOSIS — E119 Type 2 diabetes mellitus without complications: Secondary | ICD-10-CM | POA: Diagnosis not present

## 2019-04-02 ENCOUNTER — Encounter: Payer: Self-pay | Admitting: Urology

## 2019-04-02 ENCOUNTER — Ambulatory Visit (INDEPENDENT_AMBULATORY_CARE_PROVIDER_SITE_OTHER): Payer: Medicare HMO | Admitting: Urology

## 2019-04-02 ENCOUNTER — Other Ambulatory Visit: Payer: Self-pay

## 2019-04-02 VITALS — BP 173/90 | HR 67 | Temp 96.5°F | Ht 74.0 in | Wt 250.0 lb

## 2019-04-02 DIAGNOSIS — C61 Malignant neoplasm of prostate: Secondary | ICD-10-CM | POA: Diagnosis not present

## 2019-04-02 DIAGNOSIS — Z8551 Personal history of malignant neoplasm of bladder: Secondary | ICD-10-CM | POA: Diagnosis not present

## 2019-04-02 LAB — POCT URINALYSIS DIPSTICK
Blood, UA: NEGATIVE
Glucose, UA: POSITIVE — AB
Leukocytes, UA: NEGATIVE
Nitrite, UA: NEGATIVE
Protein, UA: POSITIVE — AB
Spec Grav, UA: >=1.03 — AB (ref 1.010–1.025)
Urobilinogen, UA: NEGATIVE E.U./dL — AB
pH, UA: 5 (ref 5.0–8.0)

## 2019-04-02 NOTE — Progress Notes (Signed)
Subjective:  1. Malignant neoplasm of prostate ([place])   2. History of bladder cancer     02/20/18: Alfred Miller returns today in f/u. His PSA remains <0.1 with a testosterone of 64. He got his last Lupron 45mg  in 11/18 for adjuvant therapy with EXRT for his history of prostate cancer. He has had no bone pain or weight loss. He has no hot flashes. He is voiding ok but has some intermittency and urgency and is on oxybutynin. His IPSS is 5. He has a history of bladder cancer with a negative cystoscopy in 11/18. He has had no hematuria.   GU hx: Alfred Miller is a transfer from Dr. Exie Parody. He was diagnosed with a high volume Gleason 7(4+3) prostate cancer in 5/17 with a PSA of 30. His stage was T2 N0 M0. He was started on ADT in 7/17. He has completed radiation therapy in the fall of 2017 and remains on adjuvant Lupron. He is due for an injection but will need a PA. He hasn't had a recent PSA. He has occasional hematuria and has been evaluated for that and was found to have a Low grade papillary lesion of the left lateral wall. He has stress incontinence when he stands. He has been on Vesicare and oxybutynin in the past.   his history of a low grade papillary tumor in 5/17.   He had several small erythematous areas in the bladder at his last visit but cytology was negative. He has had no hematuria    IPSS    Row Name 04/02/19 1100         International Prostate Symptom Score   How often have you had the sensation of not emptying your bladder?  Not at All     How often have you had to urinate less than every two hours?  Not at All     How often have you found you stopped and started again several times when you urinated?  Less than 1 in 5 times     How often have you found it difficult to postpone urination?  Less than 1 in 5 times     How often have you had a weak urinary stream?  Not at All     How often have you had to strain to start urination?  Not at All     How many times did you typically get up at  night to urinate?  None     Total IPSS Score  2       Quality of Life due to urinary symptoms   If you were to spend the rest of your life with your urinary condition just the way it is now how would you feel about that?  Pleased         ROS:  ROS  Allergies  Allergen Reactions  . Ciprofloxacin     Outpatient Encounter Medications as of 04/02/2019  Medication Sig Note  . amLODipine (NORVASC) 10 MG tablet    . aspirin EC 81 MG tablet Take 81 mg by mouth daily. Reported on 10/06/2015 10/06/2015: Stopped for a procedure 2 weeks ago and never restarted  . cloNIDine (CATAPRES) 0.2 MG tablet Take 0.2 mg by mouth 2 (two) times daily.   Marland Kitchen glipiZIDE (GLUCOTROL) 10 MG tablet Take 1 tablet by mouth Twice daily.   . Insulin Glargine (TOUJEO SOLOSTAR [place]) Inject into the skin as directed.   . insulin NPH-regular Human (70-30) 100 UNIT/ML injection Inject into the skin.   Marland Kitchen  lisinopril (PRINIVIL,ZESTRIL) 40 MG tablet    . neomycin-polymyxin b-dexamethasone (MAXITROL) 3.5-10000-0.1 OINT APPLY A SMALL AMOUNT OF OINTMENT TWICE DAILY TO EYELID   . traMADol (ULTRAM) 50 MG tablet Take 1 tablet (50 mg total) by mouth every 12 (twelve) hours as needed.   . [DISCONTINUED] bicalutamide (CASODEX) 50 MG tablet Take 50 mg by mouth daily.   Marland Kitchen oxybutynin (DITROPAN-XL) 10 MG 24 hr tablet Take 10 mg by mouth daily.   . TRESIBA FLEXTOUCH 200 UNIT/ML SOPN INJECT 80 UNITS SUBCUTANEOUSLY ONCE DAILY MAXIMUM DAILY DOSE OF 100 UNITS    No facility-administered encounter medications on file as of 04/02/2019.    Past Medical History:  Diagnosis Date  . Aortic root dilatation (HCC)    Aortic root 44 mm, echo, October 08, 2010  . Aortic valve sclerosis    No stenosis, echo, July, 2012  . BPH (benign prostatic hyperplasia)   . CAD (coronary artery disease)    Catheterization 2009, "some narrowing"  . Diabetes mellitus   . Ejection fraction    EF 60%,Echo, October 08, 2010  . Gout   . Hypertension     Past Surgical  History:  Procedure Laterality Date  . CARDIAC CATHETERIZATION  2009  . CIRCUMCISION  09/2009    Social History   Socioeconomic History  . Marital status: Married    Spouse name: Not on file  . Number of children: Not on file  . Years of education: Not on file  . Highest education level: Not on file  Occupational History  . Not on file  Tobacco Use  . Smoking status: Former Smoker    Packs/day: 0.30    Years: 40.00    Pack years: 12.00    Types: Cigarettes    Start date: 06/26/1968    Quit date: 03/18/2005    Years since quitting: 14.0  . Smokeless tobacco: Never Used  Substance and Sexual Activity  . Alcohol use: Not Currently    Alcohol/week: 0.0 standard drinks  . Drug use: Not Currently  . Sexual activity: Not on file  Other Topics Concern  . Not on file  Social History Narrative  . Not on file   Social Determinants of Health   Financial Resource Strain:   . Difficulty of Paying Living Expenses: Not on file  Food Insecurity:   . Worried About Charity fundraiser in the Last Year: Not on file  . Ran Out of Food in the Last Year: Not on file  Transportation Needs:   . Lack of Transportation (Medical): Not on file  . Lack of Transportation (Non-Medical): Not on file  Physical Activity:   . Days of Exercise per Week: Not on file  . Minutes of Exercise per Session: Not on file  Stress:   . Feeling of Stress : Not on file  Social Connections:   . Frequency of Communication with Friends and Family: Not on file  . Frequency of Social Gatherings with Friends and Family: Not on file  . Attends Religious Services: Not on file  . Active Member of Clubs or Organizations: Not on file  . Attends Archivist Meetings: Not on file  . Marital Status: Not on file  Intimate Partner Violence:   . Fear of Current or Ex-Partner: Not on file  . Emotionally Abused: Not on file  . Physically Abused: Not on file  . Sexually Abused: Not on file    Family History   Problem Relation Age of Onset  .  Hypertension Father   . Diabetes Father   . Hypertension Mother   . Diabetes Mother   . Breast cancer Mother        Objective: Vitals:   04/02/19 1051  BP: (!) 173/90  Pulse: 67  Temp: (!) 96.5 F (35.8 C)     Physical Exam  Lab Results:  Results for orders placed or performed in visit on 04/02/19 (from the past 24 hour(s))  POCT urinalysis dipstick     Status: Abnormal   Collection Time: 04/02/19 10:41 AM  Result Value Ref Range   Color, UA yellow    Clarity, UA hazy    Glucose, UA Positive (A) Negative   Bilirubin, UA small    Ketones, UA small    Spec Grav, UA >=1.030 (A) 1.010 - 1.025   Blood, UA neg    pH, UA 5.0 5.0 - 8.0   Protein, UA Positive (A) Negative   Urobilinogen, UA negative (A) 0.2 or 1.0 E.U./dL   Nitrite, UA neg    Leukocytes, UA Negative Negative   Appearance hazy    Odor      BMET No results for input(s): NA, K, CL, CO2, GLUCOSE, BUN, CREATININE, CALCIUM in the last 72 hours. PSA   PSA  Date Value Ref Range Status  03/31/2019 <0.1 < OR = 4.0 ng/mL Final    Comment:    The total PSA value from this assay system is  standardized against the WHO standard. The test  result will be approximately 20% lower when compared  to the equimolar-standardized total PSA (Beckman  Coulter). Comparison of serial PSA results should be  interpreted with this fact in mind. . This test was performed using the Siemens  chemiluminescent method. Values obtained from  different assay methods cannot be used interchangeably. PSA levels, regardless of value, should not be interpreted as absolute evidence of the presence or absence of disease.    Testosterone  Date Value Ref Range Status  03/31/2019 64 (L) 250 - 827 ng/dL Final    Comment:    In hypogonadal males, Testosterone, Total, LC/MS/MS, is the recommended assay due to the diminished accuracy of immunoassay at levels below 250 ng/dL. This test code [phone]) must  be collected in a red-top tube with no gel.        Studies/Results: No results found.    Assessment & Plan: History of prostate cancer.  He is doing well with an undetectible PSA but his testosterone remains low.  Repeat labs in a year.   Urgency.  Well controlled on oxybutynin.  History of bladder cancer.  UA is clear.  I will have him return in 4-6 weeks for cystoscopy.   No orders of the defined types were placed in this encounter.    Orders Placed This Encounter  Procedures  . PSA    Standing Status:   Future    Standing Expiration Date:   09/29/2020  . Testosterone    Standing Status:   Future    Standing Expiration Date:   09/29/2020  . POCT urinalysis dipstick      Return in about 3 months (around 07/01/2019) for He needs cystoscopy in the next 2-3 months.  F/U 1 year with PSA and testosterone. .   CC: Glenda Chroman, MD      Irine Seal 04/02/2019

## 2019-04-16 DIAGNOSIS — I10 Essential (primary) hypertension: Secondary | ICD-10-CM | POA: Diagnosis not present

## 2019-04-28 DIAGNOSIS — E7800 Pure hypercholesterolemia, unspecified: Secondary | ICD-10-CM | POA: Diagnosis not present

## 2019-04-28 DIAGNOSIS — I10 Essential (primary) hypertension: Secondary | ICD-10-CM | POA: Diagnosis not present

## 2019-04-28 DIAGNOSIS — E119 Type 2 diabetes mellitus without complications: Secondary | ICD-10-CM | POA: Diagnosis not present

## 2019-05-12 DIAGNOSIS — M47817 Spondylosis without myelopathy or radiculopathy, lumbosacral region: Secondary | ICD-10-CM | POA: Diagnosis not present

## 2019-05-12 DIAGNOSIS — M4814 Ankylosing hyperostosis [Forestier], thoracic region: Secondary | ICD-10-CM | POA: Diagnosis not present

## 2019-05-12 DIAGNOSIS — M25562 Pain in left knee: Secondary | ICD-10-CM | POA: Diagnosis not present

## 2019-05-12 DIAGNOSIS — M1712 Unilateral primary osteoarthritis, left knee: Secondary | ICD-10-CM | POA: Diagnosis not present

## 2019-05-12 DIAGNOSIS — G8929 Other chronic pain: Secondary | ICD-10-CM | POA: Diagnosis not present

## 2019-05-13 DIAGNOSIS — Z299 Encounter for prophylactic measures, unspecified: Secondary | ICD-10-CM | POA: Diagnosis not present

## 2019-05-13 DIAGNOSIS — I77810 Thoracic aortic ectasia: Secondary | ICD-10-CM | POA: Diagnosis not present

## 2019-05-13 DIAGNOSIS — I10 Essential (primary) hypertension: Secondary | ICD-10-CM | POA: Diagnosis not present

## 2019-05-13 DIAGNOSIS — I259 Chronic ischemic heart disease, unspecified: Secondary | ICD-10-CM | POA: Diagnosis not present

## 2019-05-13 DIAGNOSIS — Z6833 Body mass index (BMI) 33.0-33.9, adult: Secondary | ICD-10-CM | POA: Diagnosis not present

## 2019-05-13 DIAGNOSIS — E1165 Type 2 diabetes mellitus with hyperglycemia: Secondary | ICD-10-CM | POA: Diagnosis not present

## 2019-05-16 DIAGNOSIS — I10 Essential (primary) hypertension: Secondary | ICD-10-CM | POA: Diagnosis not present

## 2019-05-18 DIAGNOSIS — I10 Essential (primary) hypertension: Secondary | ICD-10-CM | POA: Diagnosis not present

## 2019-05-18 DIAGNOSIS — Z713 Dietary counseling and surveillance: Secondary | ICD-10-CM | POA: Diagnosis not present

## 2019-05-18 DIAGNOSIS — Z6831 Body mass index (BMI) 31.0-31.9, adult: Secondary | ICD-10-CM | POA: Diagnosis not present

## 2019-05-18 DIAGNOSIS — Z299 Encounter for prophylactic measures, unspecified: Secondary | ICD-10-CM | POA: Diagnosis not present

## 2019-06-04 ENCOUNTER — Other Ambulatory Visit: Payer: Medicare HMO | Admitting: Urology

## 2019-06-10 DIAGNOSIS — I10 Essential (primary) hypertension: Secondary | ICD-10-CM | POA: Diagnosis not present

## 2019-06-10 DIAGNOSIS — E119 Type 2 diabetes mellitus without complications: Secondary | ICD-10-CM | POA: Diagnosis not present

## 2019-06-10 DIAGNOSIS — E7800 Pure hypercholesterolemia, unspecified: Secondary | ICD-10-CM | POA: Diagnosis not present

## 2019-06-11 ENCOUNTER — Ambulatory Visit: Payer: Medicare HMO | Admitting: Urology

## 2019-06-11 ENCOUNTER — Other Ambulatory Visit: Payer: Self-pay

## 2019-06-11 VITALS — BP 174/85 | HR 66 | Temp 96.8°F | Ht 74.0 in | Wt 250.0 lb

## 2019-06-11 DIAGNOSIS — Z8546 Personal history of malignant neoplasm of prostate: Secondary | ICD-10-CM

## 2019-06-11 DIAGNOSIS — Z8551 Personal history of malignant neoplasm of bladder: Secondary | ICD-10-CM | POA: Diagnosis not present

## 2019-06-11 DIAGNOSIS — E291 Testicular hypofunction: Secondary | ICD-10-CM

## 2019-06-11 DIAGNOSIS — R3915 Urgency of urination: Secondary | ICD-10-CM

## 2019-06-11 LAB — POCT URINALYSIS DIPSTICK
Bilirubin, UA: NEGATIVE
Blood, UA: NEGATIVE
Glucose, UA: NEGATIVE
Ketones, UA: NEGATIVE
Leukocytes, UA: NEGATIVE
Nitrite, UA: NEGATIVE
Protein, UA: POSITIVE — AB
Spec Grav, UA: 1.025
Urobilinogen, UA: NEGATIVE U/dL — AB
pH, UA: 5

## 2019-06-11 MED ORDER — CEFTRIAXONE SODIUM 500 MG IJ SOLR
500.0000 mg | Freq: Once | INTRAMUSCULAR | Status: AC
  Administered 2019-06-11: 500 mg via INTRAMUSCULAR

## 2019-06-11 MED ORDER — CEFTRIAXONE SODIUM 1 G IJ SOLR: 1.0000 g | Freq: Once | INTRAMUSCULAR | Status: DC

## 2019-06-11 NOTE — Progress Notes (Signed)
Subjective:  1. History of bladder cancer   2. History of prostate cancer   3. Hypogonadism in male   4. Urgency of urination      02/20/18: Alfred Miller returns today in f/u for cystoscopy for f/u of his history of  [place] diagnosed in 10/17.   He has had no hematuria or new voiding complaints.   He denies any new medical problems.    His PSA was <0.1 with a testosterone of 64 in 1/21. He got his last Lupron 45mg  in 11/18 for adjuvant therapy with EXRT for his history of prostate cancer. He has had no bone pain or weight loss. He has no hot flashes. He is voiding ok but has some intermittency and urgency and is on oxybutynin. His IPSS is 5.  He had a clear urine on 04/02/19. He has had no hematuria.   GU hx: [name] is a transfer from Dr. Exie Parody. He was diagnosed with a high volume Gleason 7(4+3) prostate cancer in 5/17 with a PSA of 30. His stage was T2 N0 M0. He was started on ADT in 7/17. He has completed radiation therapy in the fall of 2017 and remains on adjuvant Lupron. He is due for an injection but will need a PA. He hasn't had a recent PSA. He has occasional hematuria and has been evaluated for that and was found to have a Low grade papillary lesion of the left lateral wall. He has stress incontinence when he stands. He has been on Vesicare and oxybutynin in the past.    ROS:  ROS:  A complete review of systems was performed.  All systems are negative except for pertinent findings as noted.   Review of Systems  All other systems reviewed and are negative.   Allergies  Allergen Reactions  . Ciprofloxacin     Outpatient Encounter Medications as of 06/11/2019  Medication Sig Note  . amLODipine (NORVASC) 10 MG tablet    . aspirin EC 81 MG tablet Take 81 mg by mouth daily. Reported on 10/06/2015 10/06/2015: Stopped for a procedure 2 weeks ago and never restarted  . CARTIA XT 240 MG 24 hr capsule Take 240 mg by mouth daily.   . carvedilol (COREG) 6.25 MG tablet Take 6.25 mg by mouth 2  (two) times daily.   . cloNIDine (CATAPRES) 0.2 MG tablet Take 0.2 mg by mouth 2 (two) times daily.   Marland Kitchen glipiZIDE (GLUCOTROL) 10 MG tablet Take 1 tablet by mouth Twice daily.   . Insulin Glargine (TOUJEO SOLOSTAR [place]) Inject into the skin as directed.   . insulin NPH-regular Human (70-30) 100 UNIT/ML injection Inject into the skin.   Marland Kitchen JANUVIA 25 MG tablet Take 25 mg by mouth daily.   Marland Kitchen lisinopril (PRINIVIL,ZESTRIL) 40 MG tablet    . NOVOLIN N RELION 100 UNIT/ML injection SMARTSIG:70 Unit(s) SUB-Q Twice Daily   . oxybutynin (DITROPAN-XL) 10 MG 24 hr tablet Take 10 mg by mouth daily.   . traMADol (ULTRAM) 50 MG tablet Take 1 tablet (50 mg total) by mouth every 12 (twelve) hours as needed.   . TRESIBA FLEXTOUCH 200 UNIT/ML SOPN INJECT 80 UNITS SUBCUTANEOUSLY ONCE DAILY MAXIMUM DAILY DOSE OF 100 UNITS   . neomycin-polymyxin b-dexamethasone (MAXITROL) 3.5-10000-0.1 OINT APPLY A SMALL AMOUNT OF OINTMENT TWICE DAILY TO EYELID    Facility-Administered Encounter Medications as of 06/11/2019  Medication  . cefTRIAXone (ROCEPHIN) injection 1 g    Past Medical History:  Diagnosis Date  . Aortic root dilatation (HCC)  Aortic root 44 mm, echo, October 08, 2010  . Aortic valve sclerosis    No stenosis, echo, July, 2012  . BPH (benign prostatic hyperplasia)   . CAD (coronary artery disease)    Catheterization 2009, "some narrowing"  . Diabetes mellitus   . Ejection fraction    EF 60%,Echo, October 08, 2010  . Gout   . Hypertension     Past Surgical History:  Procedure Laterality Date  . CARDIAC CATHETERIZATION  2009  . CIRCUMCISION  09/2009    Social History   Socioeconomic History  . Marital status: Married    Spouse name: Not on file  . Number of children: Not on file  . Years of education: Not on file  . Highest education level: Not on file  Occupational History  . Not on file  Tobacco Use  . Smoking status: Former Smoker    Packs/day: 0.30    Years: 40.00    Pack years: 12.00     Types: Cigarettes    Start date: 06/26/1968    Quit date: 03/18/2005    Years since quitting: 14.2  . Smokeless tobacco: Never Used  Substance and Sexual Activity  . Alcohol use: Not Currently    Alcohol/week: 0.0 standard drinks  . Drug use: Not Currently  . Sexual activity: Not on file  Other Topics Concern  . Not on file  Social History Narrative  . Not on file   Social Determinants of Health   Financial Resource Strain:   . Difficulty of Paying Living Expenses:   Food Insecurity:   . Worried About Charity fundraiser in the Last Year:   . Arboriculturist in the Last Year:   Transportation Needs:   . Film/video editor (Medical):   Marland Kitchen Lack of Transportation (Non-Medical):   Physical Activity:   . Days of Exercise per Week:   . Minutes of Exercise per Session:   Stress:   . Feeling of Stress :   Social Connections:   . Frequency of Communication with Friends and Family:   . Frequency of Social Gatherings with Friends and Family:   . Attends Religious Services:   . Active Member of Clubs or Organizations:   . Attends Archivist Meetings:   Marland Kitchen Marital Status:   Intimate Partner Violence:   . Fear of Current or Ex-Partner:   . Emotionally Abused:   Marland Kitchen Physically Abused:   . Sexually Abused:     Family History  Problem Relation Age of Onset  . Hypertension Father   . Diabetes Father   . Hypertension Mother   . Diabetes Mother   . Breast cancer Mother        Objective: Vitals:   06/11/19 0830  BP: (!) 174/85  Pulse: 66  Temp: (!) 96.8 F (36 C)     Physical Exam  Lab Results:  Results for orders placed or performed in visit on 06/11/19 (from the past 24 hour(s))  POCT urinalysis dipstick     Status: Abnormal   Collection Time: 06/11/19  8:35 AM  Result Value Ref Range   Color, UA dk yelow    Clarity, UA clear    Glucose, UA Negative Negative   Bilirubin, UA neg    Ketones, UA neg    Spec Grav, UA 1.025 1.010 - 1.025   Blood, UA neg     pH, UA 5.0 5.0 - 8.0   Protein, UA Positive (A) Negative   Urobilinogen, UA  negative (A) 0.2 or 1.0 E.U./dL   Nitrite, UA neg    Leukocytes, UA Negative Negative   Appearance clear    Odor      BMET No results for input(s): NA, K, CL, CO2, GLUCOSE, BUN, CREATININE, CALCIUM in the last 72 hours. PSA PSA  Date Value Ref Range Status  03/31/2019 <0.1 < OR = 4.0 ng/mL Final    Comment:    The total PSA value from this assay system is  standardized against the WHO standard. The test  result will be approximately 20% lower when compared  to the equimolar-standardized total PSA (Beckman  Coulter). Comparison of serial PSA results should be  interpreted with this fact in mind. . This test was performed using the Siemens  chemiluminescent method. Values obtained from  different assay methods cannot be used interchangeably. PSA levels, regardless of value, should not be interpreted as absolute evidence of the presence or absence of disease.    Testosterone  Date Value Ref Range Status  03/31/2019 64 (L) 250 - 827 ng/dL Final    Comment:    In hypogonadal males, Testosterone, Total, LC/MS/MS, is the recommended assay due to the diminished accuracy of immunoassay at levels below 250 ng/dL. This test code [id number]) must be collected in a red-top tube with no gel.        Studies/Results: No results found.  Procedure: Cystoscopy.  He was given rocephin 1gm IM, prepped with betadine, draped with sterile towels and the urethra was instilled with 2% lidocaine jelly.   The scope was advanced and the urethra was remarkable for a mild bulbar stricture.  The prostate is 3cm with trilobar hyperplasia and some obstruction.  The bladder wall has mild/mod trabeculation without mucosal lesions.  The UO's were not well visualized.   The scope was removed.  There were no complications.     Assessment & Plan: History of bladder cancer.   He will need cystoscopy in a year.  No recurrences  seen.   History of prostate cancer.  He will need a PSA in 3 months with OV.  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  Testosterone level in 3 months.    Urgency.  Continue oxybutynin.  Meds ordered this encounter  Medications  . cefTRIAXone (ROCEPHIN) injection 1 g    Order Specific Question:   Antibiotic Indication:    Answer:   Other Indication (list below)    Order Specific Question:   Other Indication:    Answer:   prophylaxis     Orders Placed This Encounter  Procedures  . POCT urinalysis dipstick      Return in about 3 months (around 09/11/2019).   CC: Glenda Chroman, MD      Irine Seal 06/11/2019

## 2019-06-15 DIAGNOSIS — I10 Essential (primary) hypertension: Secondary | ICD-10-CM | POA: Diagnosis not present

## 2019-07-02 DIAGNOSIS — E103213 Type 1 diabetes mellitus with mild nonproliferative diabetic retinopathy with macular edema, bilateral: Secondary | ICD-10-CM | POA: Diagnosis not present

## 2019-07-16 DIAGNOSIS — E119 Type 2 diabetes mellitus without complications: Secondary | ICD-10-CM | POA: Diagnosis not present

## 2019-07-16 DIAGNOSIS — I10 Essential (primary) hypertension: Secondary | ICD-10-CM | POA: Diagnosis not present

## 2019-07-16 DIAGNOSIS — E7800 Pure hypercholesterolemia, unspecified: Secondary | ICD-10-CM | POA: Diagnosis not present

## 2019-08-15 DIAGNOSIS — I10 Essential (primary) hypertension: Secondary | ICD-10-CM | POA: Diagnosis not present

## 2019-08-18 DIAGNOSIS — E1165 Type 2 diabetes mellitus with hyperglycemia: Secondary | ICD-10-CM | POA: Diagnosis not present

## 2019-08-18 DIAGNOSIS — Z299 Encounter for prophylactic measures, unspecified: Secondary | ICD-10-CM | POA: Diagnosis not present

## 2019-08-18 DIAGNOSIS — I77810 Thoracic aortic ectasia: Secondary | ICD-10-CM | POA: Diagnosis not present

## 2019-08-18 DIAGNOSIS — I10 Essential (primary) hypertension: Secondary | ICD-10-CM | POA: Diagnosis not present

## 2019-08-24 DIAGNOSIS — R5383 Other fatigue: Secondary | ICD-10-CM | POA: Diagnosis not present

## 2019-08-24 DIAGNOSIS — Z0000 Encounter for general adult medical examination without abnormal findings: Secondary | ICD-10-CM | POA: Diagnosis not present

## 2019-08-24 DIAGNOSIS — Z299 Encounter for prophylactic measures, unspecified: Secondary | ICD-10-CM | POA: Diagnosis not present

## 2019-08-24 DIAGNOSIS — Z6833 Body mass index (BMI) 33.0-33.9, adult: Secondary | ICD-10-CM | POA: Diagnosis not present

## 2019-08-24 DIAGNOSIS — I10 Essential (primary) hypertension: Secondary | ICD-10-CM | POA: Diagnosis not present

## 2019-08-24 DIAGNOSIS — I77810 Thoracic aortic ectasia: Secondary | ICD-10-CM | POA: Diagnosis not present

## 2019-08-24 DIAGNOSIS — Z1331 Encounter for screening for depression: Secondary | ICD-10-CM | POA: Diagnosis not present

## 2019-08-24 DIAGNOSIS — Z7189 Other specified counseling: Secondary | ICD-10-CM | POA: Diagnosis not present

## 2019-08-24 DIAGNOSIS — Z1339 Encounter for screening examination for other mental health and behavioral disorders: Secondary | ICD-10-CM | POA: Diagnosis not present

## 2019-08-24 DIAGNOSIS — E7800 Pure hypercholesterolemia, unspecified: Secondary | ICD-10-CM | POA: Diagnosis not present

## 2019-08-24 DIAGNOSIS — Z125 Encounter for screening for malignant neoplasm of prostate: Secondary | ICD-10-CM | POA: Diagnosis not present

## 2019-08-24 DIAGNOSIS — Z79899 Other long term (current) drug therapy: Secondary | ICD-10-CM | POA: Diagnosis not present

## 2019-08-24 DIAGNOSIS — Z1211 Encounter for screening for malignant neoplasm of colon: Secondary | ICD-10-CM | POA: Diagnosis not present

## 2019-09-01 DIAGNOSIS — I10 Essential (primary) hypertension: Secondary | ICD-10-CM | POA: Diagnosis not present

## 2019-09-01 DIAGNOSIS — Z87891 Personal history of nicotine dependence: Secondary | ICD-10-CM | POA: Diagnosis not present

## 2019-09-01 DIAGNOSIS — R519 Headache, unspecified: Secondary | ICD-10-CM | POA: Diagnosis not present

## 2019-09-01 DIAGNOSIS — R55 Syncope and collapse: Secondary | ICD-10-CM | POA: Diagnosis not present

## 2019-09-01 DIAGNOSIS — S199XXA Unspecified injury of neck, initial encounter: Secondary | ICD-10-CM | POA: Diagnosis not present

## 2019-09-01 DIAGNOSIS — I2510 Atherosclerotic heart disease of native coronary artery without angina pectoris: Secondary | ICD-10-CM | POA: Diagnosis not present

## 2019-09-01 DIAGNOSIS — W19XXXA Unspecified fall, initial encounter: Secondary | ICD-10-CM | POA: Diagnosis not present

## 2019-09-01 DIAGNOSIS — E119 Type 2 diabetes mellitus without complications: Secondary | ICD-10-CM | POA: Diagnosis not present

## 2019-09-01 DIAGNOSIS — G8929 Other chronic pain: Secondary | ICD-10-CM | POA: Diagnosis not present

## 2019-09-01 DIAGNOSIS — S8992XA Unspecified injury of left lower leg, initial encounter: Secondary | ICD-10-CM | POA: Diagnosis not present

## 2019-09-01 DIAGNOSIS — I700 Atherosclerosis of aorta: Secondary | ICD-10-CM | POA: Diagnosis not present

## 2019-09-01 DIAGNOSIS — S0990XA Unspecified injury of head, initial encounter: Secondary | ICD-10-CM | POA: Diagnosis not present

## 2019-09-01 DIAGNOSIS — M47816 Spondylosis without myelopathy or radiculopathy, lumbar region: Secondary | ICD-10-CM | POA: Diagnosis not present

## 2019-09-01 DIAGNOSIS — M25562 Pain in left knee: Secondary | ICD-10-CM | POA: Diagnosis not present

## 2019-09-01 DIAGNOSIS — S8002XA Contusion of left knee, initial encounter: Secondary | ICD-10-CM | POA: Diagnosis not present

## 2019-09-01 DIAGNOSIS — R52 Pain, unspecified: Secondary | ICD-10-CM | POA: Diagnosis not present

## 2019-09-01 DIAGNOSIS — S3992XA Unspecified injury of lower back, initial encounter: Secondary | ICD-10-CM | POA: Diagnosis not present

## 2019-09-01 DIAGNOSIS — R531 Weakness: Secondary | ICD-10-CM | POA: Diagnosis not present

## 2019-09-01 DIAGNOSIS — S39012A Strain of muscle, fascia and tendon of lower back, initial encounter: Secondary | ICD-10-CM | POA: Diagnosis not present

## 2019-09-03 DIAGNOSIS — E1165 Type 2 diabetes mellitus with hyperglycemia: Secondary | ICD-10-CM | POA: Diagnosis not present

## 2019-09-03 DIAGNOSIS — E1140 Type 2 diabetes mellitus with diabetic neuropathy, unspecified: Secondary | ICD-10-CM | POA: Diagnosis not present

## 2019-09-03 DIAGNOSIS — I10 Essential (primary) hypertension: Secondary | ICD-10-CM | POA: Diagnosis not present

## 2019-09-03 DIAGNOSIS — Z299 Encounter for prophylactic measures, unspecified: Secondary | ICD-10-CM | POA: Diagnosis not present

## 2019-09-03 DIAGNOSIS — Z6834 Body mass index (BMI) 34.0-34.9, adult: Secondary | ICD-10-CM | POA: Diagnosis not present

## 2019-09-03 DIAGNOSIS — D692 Other nonthrombocytopenic purpura: Secondary | ICD-10-CM | POA: Diagnosis not present

## 2019-09-03 DIAGNOSIS — I77810 Thoracic aortic ectasia: Secondary | ICD-10-CM | POA: Diagnosis not present

## 2019-09-09 NOTE — Progress Notes (Deleted)
Subjective:  No diagnosis found.   Alfred Miller returns today in f/u for review of his PSA and testosterone levels for f/u of his history of prostate cancer.  He had cystoscopy 3 months ago for f/u of bladder cancer diagnosed in 1017.   02/20/18: Alfred Miller returns today in f/u for cystoscopy for f/u of his history of  [place] diagnosed in 10/17.   He has had no hematuria or new voiding complaints.   He denies any new medical problems.    His PSA was <0.1 with a testosterone of64 in 1/21. He got his last Lupron 45mg  in 11/18 for adjuvant therapy with EXRT for his history of prostate cancer. He has had no bone pain or weight loss. He has no hot flashes. He is voiding ok but has some intermittency and urgencyand is on oxybutynin. His IPSS is5.  He had a clear urine on 04/02/19. He has had no hematuria.   GU hx: Mr. Wehrly is a transfer from Dr. Exie Parody. He was diagnosed with a high volume Gleason 7(4+3) prostate cancer in 5/17 with a PSA of 30. His stage was T2 N0 M0. He was started on ADT in 7/17. He has completed radiation therapy in the fall of 2017 and remains on adjuvant Lupron. He is due for an injection but will need a PA. He hasn't had a recent PSA. He has occasional hematuria and has been evaluated for that and was found to have a Low grade papillary lesion of the left lateral wall. He has stress incontinence when he stands. He has been on Vesicare and oxybutynin in the past.     ROS:  ROS:  A complete review of systems was performed.  All systems are negative except for pertinent findings as noted.   ROS  Allergies  Allergen Reactions  . Ciprofloxacin     Outpatient Encounter Medications as of 09/10/2019  Medication Sig Note  . amLODipine (NORVASC) 10 MG tablet    . aspirin EC 81 MG tablet Take 81 mg by mouth daily. Reported on 10/06/2015 10/06/2015: Stopped for a procedure 2 weeks ago and never restarted  . CARTIA XT 240 MG 24 hr capsule Take 240 mg by mouth daily.   . carvedilol (COREG)  6.25 MG tablet Take 6.25 mg by mouth 2 (two) times daily.   . cloNIDine (CATAPRES) 0.2 MG tablet Take 0.2 mg by mouth 2 (two) times daily.   Marland Kitchen glipiZIDE (GLUCOTROL) 10 MG tablet Take 1 tablet by mouth Twice daily.   . Insulin Glargine (TOUJEO SOLOSTAR [place]) Inject into the skin as directed.   . insulin NPH-regular Human (70-30) 100 UNIT/ML injection Inject into the skin.   Marland Kitchen JANUVIA 25 MG tablet Take 25 mg by mouth daily.   Marland Kitchen lisinopril (PRINIVIL,ZESTRIL) 40 MG tablet    . neomycin-polymyxin b-dexamethasone (MAXITROL) 3.5-10000-0.1 OINT APPLY A SMALL AMOUNT OF OINTMENT TWICE DAILY TO EYELID   . NOVOLIN N RELION 100 UNIT/ML injection SMARTSIG:70 Unit(s) SUB-Q Twice Daily   . oxybutynin (DITROPAN-XL) 10 MG 24 hr tablet Take 10 mg by mouth daily.   . traMADol (ULTRAM) 50 MG tablet Take 1 tablet (50 mg total) by mouth every 12 (twelve) hours as needed.   . TRESIBA FLEXTOUCH 200 UNIT/ML SOPN INJECT 80 UNITS SUBCUTANEOUSLY ONCE DAILY MAXIMUM DAILY DOSE OF 100 UNITS    No facility-administered encounter medications on file as of 09/10/2019.    Past Medical History:  Diagnosis Date  . Aortic root dilatation (HCC)    Aortic root 44  mm, echo, October 08, 2010  . Aortic valve sclerosis    No stenosis, echo, July, 2012  . BPH (benign prostatic hyperplasia)   . CAD (coronary artery disease)    Catheterization 2009, "some narrowing"  . Diabetes mellitus   . Ejection fraction    EF 60%,Echo, October 08, 2010  . Gout   . Hypertension     Past Surgical History:  Procedure Laterality Date  . CARDIAC CATHETERIZATION  2009  . CIRCUMCISION  09/2009    Social History   Socioeconomic History  . Marital status: Married    Spouse name: Not on file  . Number of children: Not on file  . Years of education: Not on file  . Highest education level: Not on file  Occupational History  . Not on file  Tobacco Use  . Smoking status: Former Smoker    Packs/day: 0.30    Years: 40.00    Pack years: 12.00     Types: Cigarettes    Start date: 06/26/1968    Quit date: 03/18/2005    Years since quitting: 14.4  . Smokeless tobacco: Never Used  Vaping Use  . Vaping Use: Never used  Substance and Sexual Activity  . Alcohol use: Not Currently    Alcohol/week: 0.0 standard drinks  . Drug use: Not Currently  . Sexual activity: Not on file  Other Topics Concern  . Not on file  Social History Narrative  . Not on file   Social Determinants of Health   Financial Resource Strain:   . Difficulty of Paying Living Expenses:   Food Insecurity:   . Worried About Charity fundraiser in the Last Year:   . Arboriculturist in the Last Year:   Transportation Needs:   . Film/video editor (Medical):   Marland Kitchen Lack of Transportation (Non-Medical):   Physical Activity:   . Days of Exercise per Week:   . Minutes of Exercise per Session:   Stress:   . Feeling of Stress :   Social Connections:   . Frequency of Communication with Friends and Family:   . Frequency of Social Gatherings with Friends and Family:   . Attends Religious Services:   . Active Member of Clubs or Organizations:   . Attends Archivist Meetings:   Marland Kitchen Marital Status:   Intimate Partner Violence:   . Fear of Current or Ex-Partner:   . Emotionally Abused:   Marland Kitchen Physically Abused:   . Sexually Abused:     Family History  Problem Relation Age of Onset  . Hypertension Father   . Diabetes Father   . Hypertension Mother   . Diabetes Mother   . Breast cancer Mother        Objective: There were no vitals filed for this visit.   Physical Exam  Lab Results:  No results found for this or any previous visit (from the past 24 hour(s)).  BMET No results for input(s): NA, K, CL, CO2, GLUCOSE, BUN, CREATININE, CALCIUM in the last 72 hours. PSA PSA  Date Value Ref Range Status  03/31/2019 <0.1 < OR = 4.0 ng/mL Final    Comment:    The total PSA value from this assay system is  standardized against the WHO standard. The test   result will be approximately 20% lower when compared  to the equimolar-standardized total PSA (Beckman  Coulter). Comparison of serial PSA results should be  interpreted with this fact in mind. . This test was  performed using the Siemens  chemiluminescent method. Values obtained from  different assay methods cannot be used interchangeably. PSA levels, regardless of value, should not be interpreted as absolute evidence of the presence or absence of disease.    Testosterone  Date Value Ref Range Status  03/31/2019 64 (L) 250 - 827 ng/dL Final    Comment:    In hypogonadal males, Testosterone, Total, LC/MS/MS, is the recommended assay due to the diminished accuracy of immunoassay at levels below 250 ng/dL. This test code [phone]) must be collected in a red-top tube with no gel.        Studies/Results: No results found.    Assessment & Plan: No problem-specific Assessment & Plan notes found for this encounter.    No orders of the defined types were placed in this encounter.    No orders of the defined types were placed in this encounter.     No follow-ups on file.   CC: Glenda Chroman, MD      Irine Seal 09/09/2019

## 2019-09-10 ENCOUNTER — Ambulatory Visit: Payer: Medicare HMO | Admitting: Urology

## 2019-09-14 DIAGNOSIS — M79605 Pain in left leg: Secondary | ICD-10-CM | POA: Diagnosis not present

## 2019-09-14 DIAGNOSIS — E1140 Type 2 diabetes mellitus with diabetic neuropathy, unspecified: Secondary | ICD-10-CM | POA: Diagnosis not present

## 2019-09-14 DIAGNOSIS — M79604 Pain in right leg: Secondary | ICD-10-CM | POA: Diagnosis not present

## 2019-09-15 DIAGNOSIS — E119 Type 2 diabetes mellitus without complications: Secondary | ICD-10-CM | POA: Diagnosis not present

## 2019-09-15 DIAGNOSIS — I10 Essential (primary) hypertension: Secondary | ICD-10-CM | POA: Diagnosis not present

## 2019-09-15 DIAGNOSIS — E7800 Pure hypercholesterolemia, unspecified: Secondary | ICD-10-CM | POA: Diagnosis not present

## 2019-10-06 DIAGNOSIS — I10 Essential (primary) hypertension: Secondary | ICD-10-CM | POA: Diagnosis not present

## 2019-10-15 DIAGNOSIS — Z713 Dietary counseling and surveillance: Secondary | ICD-10-CM | POA: Diagnosis not present

## 2019-10-15 DIAGNOSIS — E7800 Pure hypercholesterolemia, unspecified: Secondary | ICD-10-CM | POA: Diagnosis not present

## 2019-10-15 DIAGNOSIS — I77810 Thoracic aortic ectasia: Secondary | ICD-10-CM | POA: Diagnosis not present

## 2019-10-15 DIAGNOSIS — E1165 Type 2 diabetes mellitus with hyperglycemia: Secondary | ICD-10-CM | POA: Diagnosis not present

## 2019-10-15 DIAGNOSIS — I10 Essential (primary) hypertension: Secondary | ICD-10-CM | POA: Diagnosis not present

## 2019-10-15 DIAGNOSIS — Z299 Encounter for prophylactic measures, unspecified: Secondary | ICD-10-CM | POA: Diagnosis not present

## 2019-10-15 DIAGNOSIS — E119 Type 2 diabetes mellitus without complications: Secondary | ICD-10-CM | POA: Diagnosis not present

## 2019-10-26 DIAGNOSIS — S91302A Unspecified open wound, left foot, initial encounter: Secondary | ICD-10-CM | POA: Diagnosis not present

## 2019-10-26 DIAGNOSIS — E7800 Pure hypercholesterolemia, unspecified: Secondary | ICD-10-CM | POA: Diagnosis not present

## 2019-10-26 DIAGNOSIS — Z299 Encounter for prophylactic measures, unspecified: Secondary | ICD-10-CM | POA: Diagnosis not present

## 2019-10-26 DIAGNOSIS — I10 Essential (primary) hypertension: Secondary | ICD-10-CM | POA: Diagnosis not present

## 2019-10-26 DIAGNOSIS — N400 Enlarged prostate without lower urinary tract symptoms: Secondary | ICD-10-CM | POA: Diagnosis not present

## 2019-11-02 ENCOUNTER — Emergency Department (HOSPITAL_COMMUNITY): Payer: Medicare HMO

## 2019-11-02 ENCOUNTER — Encounter (HOSPITAL_COMMUNITY): Payer: Self-pay | Admitting: *Deleted

## 2019-11-02 ENCOUNTER — Other Ambulatory Visit: Payer: Self-pay

## 2019-11-02 ENCOUNTER — Inpatient Hospital Stay (HOSPITAL_COMMUNITY)
Admission: EM | Admit: 2019-11-02 | Discharge: 2019-11-09 | DRG: 240 | Disposition: A | Discharge status: Skilled Nursing Facility | Payer: Medicare HMO | Attending: Internal Medicine | Admitting: Internal Medicine

## 2019-11-02 DIAGNOSIS — Z794 Long term (current) use of insulin: Secondary | ICD-10-CM | POA: Diagnosis not present

## 2019-11-02 DIAGNOSIS — Z881 Allergy status to other antibiotic agents status: Secondary | ICD-10-CM

## 2019-11-02 DIAGNOSIS — Z6831 Body mass index (BMI) 31.0-31.9, adult: Secondary | ICD-10-CM

## 2019-11-02 DIAGNOSIS — Z20822 Contact with and (suspected) exposure to covid-19: Secondary | ICD-10-CM | POA: Diagnosis present

## 2019-11-02 DIAGNOSIS — E1140 Type 2 diabetes mellitus with diabetic neuropathy, unspecified: Secondary | ICD-10-CM | POA: Diagnosis not present

## 2019-11-02 DIAGNOSIS — N400 Enlarged prostate without lower urinary tract symptoms: Secondary | ICD-10-CM | POA: Diagnosis present

## 2019-11-02 DIAGNOSIS — L03116 Cellulitis of left lower limb: Secondary | ICD-10-CM | POA: Diagnosis present

## 2019-11-02 DIAGNOSIS — Z8249 Family history of ischemic heart disease and other diseases of the circulatory system: Secondary | ICD-10-CM

## 2019-11-02 DIAGNOSIS — E669 Obesity, unspecified: Secondary | ICD-10-CM | POA: Diagnosis not present

## 2019-11-02 DIAGNOSIS — I2510 Atherosclerotic heart disease of native coronary artery without angina pectoris: Secondary | ICD-10-CM | POA: Diagnosis not present

## 2019-11-02 DIAGNOSIS — Z87891 Personal history of nicotine dependence: Secondary | ICD-10-CM | POA: Diagnosis not present

## 2019-11-02 DIAGNOSIS — E1152 Type 2 diabetes mellitus with diabetic peripheral angiopathy with gangrene: Secondary | ICD-10-CM | POA: Diagnosis not present

## 2019-11-02 DIAGNOSIS — Z880 Allergy status to penicillin: Secondary | ICD-10-CM

## 2019-11-02 DIAGNOSIS — I10 Essential (primary) hypertension: Secondary | ICD-10-CM | POA: Diagnosis present

## 2019-11-02 DIAGNOSIS — E1165 Type 2 diabetes mellitus with hyperglycemia: Secondary | ICD-10-CM | POA: Diagnosis present

## 2019-11-02 DIAGNOSIS — E66811 Obesity, class 1: Secondary | ICD-10-CM

## 2019-11-02 DIAGNOSIS — R9431 Abnormal electrocardiogram [ECG] [EKG]: Secondary | ICD-10-CM | POA: Diagnosis not present

## 2019-11-02 DIAGNOSIS — L03032 Cellulitis of left toe: Secondary | ICD-10-CM

## 2019-11-02 DIAGNOSIS — D62 Acute posthemorrhagic anemia: Secondary | ICD-10-CM | POA: Diagnosis not present

## 2019-11-02 DIAGNOSIS — Z79899 Other long term (current) drug therapy: Secondary | ICD-10-CM

## 2019-11-02 DIAGNOSIS — Z89512 Acquired absence of left leg below knee: Secondary | ICD-10-CM | POA: Diagnosis not present

## 2019-11-02 DIAGNOSIS — I998 Other disorder of circulatory system: Secondary | ICD-10-CM | POA: Diagnosis not present

## 2019-11-02 DIAGNOSIS — Z833 Family history of diabetes mellitus: Secondary | ICD-10-CM | POA: Diagnosis not present

## 2019-11-02 DIAGNOSIS — D72829 Elevated white blood cell count, unspecified: Secondary | ICD-10-CM | POA: Diagnosis not present

## 2019-11-02 DIAGNOSIS — M6281 Muscle weakness (generalized): Secondary | ICD-10-CM | POA: Diagnosis not present

## 2019-11-02 DIAGNOSIS — S91302S Unspecified open wound, left foot, sequela: Secondary | ICD-10-CM

## 2019-11-02 DIAGNOSIS — R509 Fever, unspecified: Secondary | ICD-10-CM

## 2019-11-02 DIAGNOSIS — Z7982 Long term (current) use of aspirin: Secondary | ICD-10-CM

## 2019-11-02 DIAGNOSIS — L97529 Non-pressure chronic ulcer of other part of left foot with unspecified severity: Secondary | ICD-10-CM | POA: Diagnosis present

## 2019-11-02 DIAGNOSIS — Z803 Family history of malignant neoplasm of breast: Secondary | ICD-10-CM | POA: Diagnosis not present

## 2019-11-02 DIAGNOSIS — I25799 Atherosclerosis of other coronary artery bypass graft(s) with unspecified angina pectoris: Secondary | ICD-10-CM | POA: Diagnosis not present

## 2019-11-02 DIAGNOSIS — S9032XA Contusion of left foot, initial encounter: Secondary | ICD-10-CM | POA: Diagnosis not present

## 2019-11-02 DIAGNOSIS — E1151 Type 2 diabetes mellitus with diabetic peripheral angiopathy without gangrene: Secondary | ICD-10-CM | POA: Diagnosis not present

## 2019-11-02 DIAGNOSIS — I96 Gangrene, not elsewhere classified: Secondary | ICD-10-CM

## 2019-11-02 DIAGNOSIS — S91302A Unspecified open wound, left foot, initial encounter: Secondary | ICD-10-CM | POA: Diagnosis not present

## 2019-11-02 DIAGNOSIS — S299XXA Unspecified injury of thorax, initial encounter: Secondary | ICD-10-CM | POA: Diagnosis not present

## 2019-11-02 DIAGNOSIS — R262 Difficulty in walking, not elsewhere classified: Secondary | ICD-10-CM | POA: Diagnosis not present

## 2019-11-02 DIAGNOSIS — Z888 Allergy status to other drugs, medicaments and biological substances status: Secondary | ICD-10-CM | POA: Diagnosis not present

## 2019-11-02 DIAGNOSIS — W25XXXS Contact with sharp glass, sequela: Secondary | ICD-10-CM | POA: Diagnosis not present

## 2019-11-02 DIAGNOSIS — R4182 Altered mental status, unspecified: Secondary | ICD-10-CM | POA: Diagnosis not present

## 2019-11-02 DIAGNOSIS — I70262 Atherosclerosis of native arteries of extremities with gangrene, left leg: Secondary | ICD-10-CM | POA: Diagnosis not present

## 2019-11-02 DIAGNOSIS — I70229 Atherosclerosis of native arteries of extremities with rest pain, unspecified extremity: Secondary | ICD-10-CM | POA: Diagnosis not present

## 2019-11-02 DIAGNOSIS — E7800 Pure hypercholesterolemia, unspecified: Secondary | ICD-10-CM | POA: Diagnosis not present

## 2019-11-02 DIAGNOSIS — N179 Acute kidney failure, unspecified: Secondary | ICD-10-CM

## 2019-11-02 DIAGNOSIS — E11621 Type 2 diabetes mellitus with foot ulcer: Secondary | ICD-10-CM | POA: Diagnosis not present

## 2019-11-02 DIAGNOSIS — S91309A Unspecified open wound, unspecified foot, initial encounter: Secondary | ICD-10-CM

## 2019-11-02 DIAGNOSIS — X58XXXA Exposure to other specified factors, initial encounter: Secondary | ICD-10-CM | POA: Diagnosis not present

## 2019-11-02 DIAGNOSIS — Z4781 Encounter for orthopedic aftercare following surgical amputation: Secondary | ICD-10-CM | POA: Diagnosis not present

## 2019-11-02 DIAGNOSIS — L02612 Cutaneous abscess of left foot: Secondary | ICD-10-CM | POA: Diagnosis not present

## 2019-11-02 DIAGNOSIS — L97509 Non-pressure chronic ulcer of other part of unspecified foot with unspecified severity: Secondary | ICD-10-CM | POA: Diagnosis not present

## 2019-11-02 DIAGNOSIS — Z7401 Bed confinement status: Secondary | ICD-10-CM | POA: Diagnosis not present

## 2019-11-02 DIAGNOSIS — E785 Hyperlipidemia, unspecified: Secondary | ICD-10-CM

## 2019-11-02 HISTORY — DX: Dyspnea, unspecified: R06.00

## 2019-11-02 HISTORY — DX: Angina pectoris, unspecified: I20.9

## 2019-11-02 LAB — COMPREHENSIVE METABOLIC PANEL
ALT: 50 U/L — ABNORMAL HIGH (ref 0–44)
AST: 42 U/L — ABNORMAL HIGH (ref 15–41)
Albumin: 3.3 g/dL — ABNORMAL LOW (ref 3.5–5.0)
Alkaline Phosphatase: 70 U/L (ref 38–126)
Anion gap: 12 (ref 5–15)
BUN: 25 mg/dL — ABNORMAL HIGH (ref 8–23)
CO2: 29 mmol/L (ref 22–32)
Calcium: 8.9 mg/dL (ref 8.9–10.3)
Chloride: 96 mmol/L — ABNORMAL LOW (ref 98–111)
Creatinine, Ser: 1.31 mg/dL — ABNORMAL HIGH (ref 0.61–1.24)
GFR calc Af Amer: >60 mL/min (ref >60)
GFR calc non Af Amer: 55 mL/min — ABNORMAL LOW (ref >60)
Glucose, Bld: 118 mg/dL — ABNORMAL HIGH (ref 70–99)
Potassium: 3.9 mmol/L (ref 3.5–5.1)
Sodium: 137 mmol/L (ref 135–145)
Total Bilirubin: 0.6 mg/dL (ref 0.3–1.2)
Total Protein: 8.9 g/dL — ABNORMAL HIGH (ref 6.5–8.1)

## 2019-11-02 LAB — LACTIC ACID, PLASMA
Lactic Acid, Venous: 1.2 mmol/L (ref 0.5–1.9)
Lactic Acid, Venous: 1.4 mmol/L (ref 0.5–1.9)

## 2019-11-02 LAB — CBC WITH DIFFERENTIAL/PLATELET
Abs Immature Granulocytes: 0.32 10*3/uL — ABNORMAL HIGH (ref 0.00–0.07)
Basophils Absolute: 0.1 10*3/uL (ref 0.0–0.1)
Basophils Relative: 0 %
Eosinophils Absolute: 0.1 10*3/uL (ref 0.0–0.5)
Eosinophils Relative: 1 %
HCT: 37.3 % — ABNORMAL LOW (ref 39.0–52.0)
Hemoglobin: 12.1 g/dL — ABNORMAL LOW (ref 13.0–17.0)
Immature Granulocytes: 2 %
Lymphocytes Relative: 11 %
Lymphs Abs: 2.2 10*3/uL (ref 0.7–4.0)
MCH: 29.5 pg (ref 26.0–34.0)
MCHC: 32.4 g/dL (ref 30.0–36.0)
MCV: 91 fL (ref 80.0–100.0)
Monocytes Absolute: 2.1 10*3/uL — ABNORMAL HIGH (ref 0.1–1.0)
Monocytes Relative: 10 %
Neutro Abs: 16.1 10*3/uL — ABNORMAL HIGH (ref 1.7–7.7)
Neutrophils Relative %: 76 %
Platelets: 531 10*3/uL — ABNORMAL HIGH (ref 150–400)
RBC: 4.1 MIL/uL — ABNORMAL LOW (ref 4.22–5.81)
RDW: 13.6 % (ref 11.5–15.5)
WBC: 21 10*3/uL — ABNORMAL HIGH (ref 4.0–10.5)
nRBC: 0 % (ref 0.0–0.2)

## 2019-11-02 LAB — PROTIME-INR
INR: 1.2 (ref 0.8–1.2)
Prothrombin Time: 14.6 seconds (ref 11.4–15.2)

## 2019-11-02 LAB — SARS CORONAVIRUS 2 BY RT PCR (HOSPITAL ORDER, PERFORMED IN ~~LOC~~ HOSPITAL LAB): SARS Coronavirus 2: NEGATIVE

## 2019-11-02 LAB — CBG MONITORING, ED: Glucose-Capillary: 143 mg/dL — ABNORMAL HIGH (ref 70–99)

## 2019-11-02 LAB — APTT: aPTT: 44 seconds — ABNORMAL HIGH (ref 24–36)

## 2019-11-02 MED ORDER — PIPERACILLIN-TAZOBACTAM 3.375 G IVPB 30 MIN
3.3750 g | Freq: Once | INTRAVENOUS | Status: AC
  Administered 2019-11-02: 3.375 g via INTRAVENOUS
  Filled 2019-11-02: qty 50

## 2019-11-02 MED ORDER — MUPIROCIN 2 % EX OINT
1.0000 "application " | TOPICAL_OINTMENT | Freq: Two times a day (BID) | CUTANEOUS | Status: AC
  Administered 2019-11-03 – 2019-11-07 (×9): 1 via NASAL
  Filled 2019-11-02: qty 22

## 2019-11-02 MED ORDER — INSULIN ASPART 100 UNIT/ML ~~LOC~~ SOLN
0.0000 [IU] | Freq: Every day | SUBCUTANEOUS | Status: DC
  Administered 2019-11-03 – 2019-11-06 (×2): 3 [IU] via SUBCUTANEOUS
  Administered 2019-11-07: 2 [IU] via SUBCUTANEOUS
  Administered 2019-11-08: 3 [IU] via SUBCUTANEOUS

## 2019-11-02 MED ORDER — VANCOMYCIN HCL IN DEXTROSE 1-5 GM/200ML-% IV SOLN
1000.0000 mg | INTRAVENOUS | Status: AC
  Administered 2019-11-02 (×2): 1000 mg via INTRAVENOUS
  Filled 2019-11-02 (×2): qty 200

## 2019-11-02 MED ORDER — INSULIN ASPART 100 UNIT/ML ~~LOC~~ SOLN
0.0000 [IU] | Freq: Three times a day (TID) | SUBCUTANEOUS | Status: DC
  Administered 2019-11-03: 3 [IU] via SUBCUTANEOUS
  Administered 2019-11-03: 2 [IU] via SUBCUTANEOUS
  Administered 2019-11-03: 1 [IU] via SUBCUTANEOUS
  Administered 2019-11-04 – 2019-11-05 (×3): 2 [IU] via SUBCUTANEOUS
  Administered 2019-11-05: 1 [IU] via SUBCUTANEOUS
  Administered 2019-11-05: 3 [IU] via SUBCUTANEOUS
  Administered 2019-11-06: 5 [IU] via SUBCUTANEOUS
  Administered 2019-11-06: 1 [IU] via SUBCUTANEOUS
  Administered 2019-11-07: 2 [IU] via SUBCUTANEOUS
  Administered 2019-11-07: 3 [IU] via SUBCUTANEOUS
  Administered 2019-11-07 – 2019-11-08 (×3): 2 [IU] via SUBCUTANEOUS
  Administered 2019-11-08: 3 [IU] via SUBCUTANEOUS
  Administered 2019-11-09: 2 [IU] via SUBCUTANEOUS
  Administered 2019-11-09: 3 [IU] via SUBCUTANEOUS

## 2019-11-02 MED ORDER — CARVEDILOL 12.5 MG PO TABS
12.5000 mg | ORAL_TABLET | Freq: Two times a day (BID) | ORAL | Status: DC
  Administered 2019-11-03 – 2019-11-09 (×12): 12.5 mg via ORAL
  Filled 2019-11-02 (×13): qty 1

## 2019-11-02 MED ORDER — ACETAMINOPHEN 650 MG RE SUPP: 650.0000 mg | Freq: Four times a day (QID) | RECTAL | Status: DC | PRN

## 2019-11-02 MED ORDER — AMLODIPINE BESYLATE 5 MG PO TABS
10.0000 mg | ORAL_TABLET | Freq: Every day | ORAL | Status: DC
  Administered 2019-11-03 – 2019-11-09 (×6): 10 mg via ORAL
  Filled 2019-11-02 (×7): qty 2

## 2019-11-02 MED ORDER — ATORVASTATIN CALCIUM 10 MG PO TABS
10.0000 mg | ORAL_TABLET | Freq: Every day | ORAL | Status: DC
  Administered 2019-11-03 – 2019-11-09 (×6): 10 mg via ORAL
  Filled 2019-11-02 (×7): qty 1

## 2019-11-02 MED ORDER — VANCOMYCIN HCL IN DEXTROSE 1-5 GM/200ML-% IV SOLN: 1000.0000 mg | Freq: Once | INTRAVENOUS | Status: DC

## 2019-11-02 MED ORDER — LACTATED RINGERS IV SOLN: INTRAVENOUS | Status: AC

## 2019-11-02 MED ORDER — SODIUM CHLORIDE 0.9 % IV SOLN
2.0000 g | Freq: Once | INTRAVENOUS | Status: AC
  Administered 2019-11-02: 2 g via INTRAVENOUS
  Filled 2019-11-02: qty 20

## 2019-11-02 MED ORDER — VANCOMYCIN HCL 750 MG/150ML IV SOLN
750.0000 mg | Freq: Two times a day (BID) | INTRAVENOUS | Status: DC
  Administered 2019-11-03 – 2019-11-05 (×5): 750 mg via INTRAVENOUS
  Filled 2019-11-02 (×6): qty 150

## 2019-11-02 MED ORDER — ACETAMINOPHEN 325 MG PO TABS
650.0000 mg | ORAL_TABLET | Freq: Four times a day (QID) | ORAL | Status: DC | PRN
  Administered 2019-11-02 – 2019-11-08 (×3): 650 mg via ORAL
  Filled 2019-11-02 (×3): qty 2

## 2019-11-02 MED ORDER — MORPHINE SULFATE (PF) 2 MG/ML IV SOLN
2.0000 mg | INTRAVENOUS | Status: DC | PRN
  Administered 2019-11-02 – 2019-11-03 (×3): 2 mg via INTRAVENOUS
  Filled 2019-11-02 (×3): qty 1

## 2019-11-02 MED ORDER — LACTATED RINGERS IV BOLUS (SEPSIS)
500.0000 mL | Freq: Once | INTRAVENOUS | Status: AC
  Administered 2019-11-02: 500 mL via INTRAVENOUS

## 2019-11-02 MED ORDER — PIPERACILLIN-TAZOBACTAM 3.375 G IVPB
3.3750 g | Freq: Three times a day (TID) | INTRAVENOUS | Status: DC
  Administered 2019-11-03 – 2019-11-05 (×6): 3.375 g via INTRAVENOUS
  Filled 2019-11-02 (×6): qty 50

## 2019-11-02 NOTE — Progress Notes (Addendum)
Pharmacy Antibiotic Note  Alfred Miller is a 69 y.o. male admitted on 11/02/2019 with wound infection/cellulits.  Pharmacy has been consulted for Vanco + Zosyn dosing.  CC/HPI: Stepped on glass causing worsening L foot wound, fever, chills, toe discoloration. No prior antibiotics  PMH: DM, CAD, BPH, gout, HTN, OA, aortic root dilatation, AV sclerosis  ID: Cellulitis, severe foot wound infection. Tmax 99. WBC 21. Scr 1.31 Vanco 8/17>> Zosyn 8/17>>  Plan: Vanco 2g to be given in ED, then start 750mg  IV q 12 hrs. Vanco trough after 3-5 doses at steady state if continued. Add Zosyn 3.375g IV q8hr.    Height: 6\' 2"  (188 cm) Weight: 113.4 kg (250 lb) IBW/kg (Calculated) : 82.2  Temp (24hrs), Avg:98.6 F (37 C), Min:98.2 F (36.8 C), Max:99 F (37.2 C)  Recent Labs  Lab 11/02/19 1715  WBC 21.0*  CREATININE 1.31*  LATICACIDVEN 1.2    Estimated Creatinine Clearance: 71.3 mL/min (A) (by C-G formula based on SCr of 1.31 mg/dL (H)).    Allergies  Allergen Reactions  . Ciprofloxacin     Machaela Caterino S. Alford Highland, PharmD, BCPS Clinical Staff Pharmacist [email or web address]  Wayland Salinas 11/02/2019 6:40 PM

## 2019-11-02 NOTE — ED Provider Notes (Signed)
Emergency Department Provider Note   I have reviewed the triage vital signs and the nursing notes.   HISTORY  Chief Complaint Foot Pain   HPI Alfred Miller is a 69 y.o. male with past medical history reviewed below including diabetes and prior history of CAD presents to the emergency department with left foot wound worsening significantly over the past week.  Patient states that over the past 2 days he has developed subjective fever and chills.  He is noticed discoloration to the toes on the left foot over the past several days.  He did see his primary care doctor after initially stepping on glass which caused a cut.  He has not been on antibiotics.  With worsening discoloration and now a foul odor from the wound he presents for evaluation.  Denies any prior history of foot wound or severe infection. No radiation of symptoms or modifying factors.   Past Medical History:  Diagnosis Date  . Aortic root dilatation (HCC)    Aortic root 44 mm, echo, October 08, 2010  . Aortic valve sclerosis    No stenosis, echo, July, 2012  . BPH (benign prostatic hyperplasia)   . CAD (coronary artery disease)    Catheterization 2009, "some narrowing"  . Diabetes mellitus   . Ejection fraction    EF 60%,Echo, October 08, 2010  . Gout   . Hypertension     Patient Active Problem List   Diagnosis Date Noted  . Unilateral primary osteoarthritis, right knee 11/12/2017  . Unilateral primary osteoarthritis, left knee 11/12/2017  . Ejection fraction   . Aortic root dilatation ([place])   . Hypertension   . Diabetes mellitus   . CAD (coronary artery disease)   . Ejection fraction   . Aortic valve sclerosis     Past Surgical History:  Procedure Laterality Date  . CARDIAC CATHETERIZATION  2009  . CIRCUMCISION  09/2009    Allergies Ciprofloxacin  Family History  Problem Relation Age of Onset  . Hypertension Father   . Diabetes Father   . Hypertension Mother   . Diabetes Mother   . Breast cancer  Mother     Social History Social History   Tobacco Use  . Smoking status: Former Smoker    Packs/day: 0.30    Years: 40.00    Pack years: 12.00    Types: Cigarettes    Start date: 06/26/1968    Quit date: 03/18/2005    Years since quitting: 14.6  . Smokeless tobacco: Never Used  Vaping Use  . Vaping Use: Never used  Substance Use Topics  . Alcohol use: Not Currently    Alcohol/week: 0.0 standard drinks  . Drug use: Not Currently    Review of Systems  Constitutional: Positive subjective fever/chills Eyes: No visual changes. ENT: No sore throat. Cardiovascular: Denies chest pain. Respiratory: Denies shortness of breath. Gastrointestinal: No abdominal pain.  No nausea, no vomiting.  No diarrhea.  No constipation. Genitourinary: Negative for dysuria. Musculoskeletal: Negative for back pain. Left foot pain.  Skin: Wound to the left foot with discoloration and foul odor.  Neurological: Negative for headaches, focal weakness or numbness.  10-point ROS otherwise negative.  ____________________________________________   PHYSICAL EXAM:  VITAL SIGNS: ED Triage Vitals  Enc Vitals Group     BP 11/02/19 1517 119/70     Pulse Rate 11/02/19 1517 70     Resp 11/02/19 1517 16     Temp 11/02/19 1517 98.2 F (36.8 C)     Temp  Source 11/02/19 1517 Oral     SpO2 11/02/19 1517 97 %     Weight 11/02/19 1518 250 lb (113.4 kg)     Height 11/02/19 1518 6\' 2"  (1.88 m)   Constitutional: Alert and oriented. Well appearing and in no acute distress. Eyes: Conjunctivae are normal.  Head: Atraumatic. Nose: No congestion/rhinnorhea. Mouth/Throat: Mucous membranes are moist.   Neck: No stridor.   Cardiovascular: Normal rate, regular rhythm. Good peripheral circulation. Grossly normal heart sounds.  Able to doppler pulse both DP and PT on the left and marked with skin marker.  Respiratory: Normal respiratory effort.  No retractions. Lungs CTAB. Gastrointestinal: No distention.    Musculoskeletal: No deformity noted. Edema of the left foot compared to the right.  Neurologic:  Normal speech and language. No gross focal neurologic deficits are appreciated.  Skin: Patient with discoloration to the 1st-3rd toes. The second toe has desquamated skin and dusky color. Dusky appearance to the distal forefoot along with plantar surface. Patient also has an abrasion over the sternum. No ecchymosis. No laceration. Mild surrounding tenderness. No crepitus.          ____________________________________________   LABS (all labs ordered are listed, but only abnormal results are displayed)  Labs Reviewed  COMPREHENSIVE METABOLIC PANEL - Abnormal; Notable for the following components:      Result Value   Chloride 96 (*)    Glucose, Bld 118 (*)    BUN 25 (*)    Creatinine, Ser 1.31 (*)    Total Protein 8.9 (*)    Albumin 3.3 (*)    AST 42 (*)    ALT 50 (*)    GFR calc non Af Amer 55 (*)    All other components within normal limits  CBC WITH DIFFERENTIAL/PLATELET - Abnormal; Notable for the following components:   WBC 21.0 (*)    RBC 4.10 (*)    Hemoglobin 12.1 (*)    HCT 37.3 (*)    Platelets 531 (*)    Neutro Abs 16.1 (*)    Monocytes Absolute 2.1 (*)    Abs Immature Granulocytes 0.32 (*)    All other components within normal limits  APTT - Abnormal; Notable for the following components:   aPTT 44 (*)    All other components within normal limits  CULTURE, BLOOD (ROUTINE X 2)  CULTURE, BLOOD (ROUTINE X 2)  URINE CULTURE  SARS CORONAVIRUS 2 BY RT PCR (HOSPITAL ORDER, [facility])  LACTIC ACID, PLASMA  PROTIME-INR  LACTIC ACID, PLASMA  URINALYSIS, ROUTINE W REFLEX MICROSCOPIC   ____________________________________________  EKG   EKG Interpretation  Date/Time:  Tuesday November 02 2019 16:52:34 EDT Ventricular Rate:  74 PR Interval:  168 QRS Duration: 78 QT Interval:  448 QTC Calculation: 497 R Axis:   62 Text  Interpretation: Normal sinus rhythm Nonspecific ST abnormality Prolonged QT Abnormal ECG No STEMI Confirmed by Nanda Quinton [phone]) on 11/02/2019 7:24:42 PM       ____________________________________________  RADIOLOGY  DG Chest 2 View  Result Date: 11/02/2019 CLINICAL DATA:  [place], left foot pain EXAM: CHEST - 2 VIEW COMPARISON:  09/01/2019 FINDINGS: The heart size and mediastinal contours are within normal limits. Both lungs are clear. The visualized skeletal structures are unremarkable. IMPRESSION: No active cardiopulmonary disease. Electronically Signed   By: Randa Ngo M.D.   On: 11/02/2019 18:41   DG Foot Complete Left  Result Date: 11/02/2019 CLINICAL DATA:  Pulled a piece of glass out of his  left foot 1 week ago with subsequent redness and bruising. EXAM: LEFT FOOT - COMPLETE 3+ VIEW COMPARISON:  None. FINDINGS: There is no evidence of fracture or dislocation. There is no evidence of arthropathy or other focal bone abnormality. Mild focal soft tissue swelling is seen along the dorsal aspect of the distal left foot. No radiopaque soft tissue foreign bodies are identified. IMPRESSION: Mild focal dorsal soft tissue swelling with no radiopaque soft tissue foreign bodies identified. Electronically Signed   By: Virgina Norfolk M.D.   On: 11/02/2019 16:17    ____________________________________________   PROCEDURES  Procedure(s) performed:   Procedures  CRITICAL CARE Performed by: Margette Fast Total critical care time: 35 minutes Critical care time was exclusive of separately billable procedures and treating other patients. Critical care was necessary to treat or prevent imminent or life-threatening deterioration. Critical care was time spent personally by me on the following activities: development of treatment plan with patient and/or surrogate as well as nursing, discussions with consultants, evaluation of patient's response to treatment, examination of patient, obtaining  history from patient or surrogate, ordering and performing treatments and interventions, ordering and review of laboratory studies, ordering and review of radiographic studies, pulse oximetry and re-evaluation of patient's condition.  Nanda Quinton, MD Emergency Medicine  ____________________________________________   INITIAL IMPRESSION / ASSESSMENT AND PLAN / ED COURSE  Pertinent labs & imaging results that were available during my care of the patient were reviewed by me and considered in my medical decision making (see chart for details).   Patient presents emergency department with left foot pain with discoloration and clear infection of the left foot.  Presentation seems most consistent with gangrenous change.  Patient does have dopplerable pulses in the left DP and PT.  He does have diabetes.  Plan to start broad-spectrum antibiotics.  No evidence of osteomyelitis on plain film but given the degree of this wound I do have increased suspicion for this diagnosis.  Plan to discuss with orthopedics on call after initial blood work. Patient will require admit and risk of amputation is high.   Lab work reviewed.  Patient with significant leukocytosis.  Imaging reviewed as well.  Discussed the case with Dr. Arnoldo Morale who will evaluate the patient in the morning for possible amputation depending on response to antibiotics.   Discussed patient's case with [facility] to request admission. Patient and family (if present) updated with plan. Care transferred to [facility] service.  I reviewed all nursing notes, vitals, pertinent old records, EKGs, labs, imaging (as available).  ____________________________________________  FINAL CLINICAL IMPRESSION(S) / ED DIAGNOSES  Final diagnoses:  Gangrene of left foot ([place])    MEDICATIONS GIVEN DURING THIS VISIT:  Medications  lactated ringers infusion ( Intravenous New Bag/Given 11/02/19 1733)  vancomycin (VANCOCIN) IVPB 1000 mg/200 mL premix (1,000 mg Intravenous New  Bag/Given 11/02/19 1841)  vancomycin (VANCOREADY) IVPB 750 mg/150 mL (has no administration in time range)  lactated ringers bolus 500 mL (0 mLs Intravenous Stopped 11/02/19 1802)  cefTRIAXone (ROCEPHIN) 2 g in sodium chloride 0.9 % 100 mL IVPB (0 g Intravenous Stopped 11/02/19 1804)    Note:  This document was prepared using Dragon voice recognition software and may include unintentional dictation errors.  Nanda Quinton, MD, [facility] Emergency Medicine    Nathali Vent, [place], MD 11/02/19 [phone]

## 2019-11-02 NOTE — Plan of Care (Signed)
°  Problem: Education: Goal: Knowledge of General Education information will improve Description: Including pain rating scale, medication(s)/side effects and non-pharmacologic comfort measures Outcome: Progressing   Problem: Clinical Measurements: Goal: Ability to maintain clinical measurements within normal limits will improve Outcome: Progressing   Problem: Pain Managment: Goal: General experience of comfort will improve Outcome: Progressing   Problem: Safety: Goal: Ability to remain free from injury will improve Outcome: Progressing   Problem: Activity: Goal: Risk for activity intolerance will decrease Outcome: Not Progressing   Problem: Nutrition: Goal: Adequate nutrition will be maintained Outcome: Not Progressing

## 2019-11-02 NOTE — H&P (Addendum)
History and Physical  Bonham Zingale YIR:[id number] DOB: May 29, 1950 DOA: 11/02/2019  Referring physician: Margette Fast, MD PCP: Glenda Chroman, MD  Outpatient Specialists: cardiology Bronson Ing, Lenise Herald, MD) Patient coming from:Home  Chief Complaint:Left foot Pain  HPI: Alfred Miller is a 69 y.o. male with medical history significant for type 2 diabetes mellitus, essential hypertension, CAD who presents to the emergency department due to worsening left foot wound.  Patient states that he accidentally stepped on a glass over a week ago and sustained a wound of left foot which has been worsening since onset.  He has since seen his PCP after sustaining the wound, but there was no antibiotics required at that time per patient.  Over the past 2 days, he noted discoloration to the left toes and has since developed subjective fever and chills, patient also noted a foul odor from the wound which prompted him to go to the ED for further evaluation.  He denies nausea, vomiting, headache, blurry vision, numbness or tingling.  ED Course:  In the emergency department, he was hemodynamically stable.  Work-up in the ED showed leukocytosis with a left shift.  BUN/creatinine 25/1.31 (no prior labs for comparison).  Albumin 3.3, AST 42, ALT 50.  Left foot x-ray showed mild focal dorsal soft tissue swelling with no radiopaque soft tissue foreign bodies identified. He was started on IV Vancomycin and ceftriaxone.  General surgery was consulted and will see patient in the morning.   Review of Systems: Constitutional: Positive for subjective chills and fever.  HENT: Negative for ear pain and sore throat.   Eyes: Negative for pain and visual disturbance.  Respiratory: Negative for cough, chest tightness and shortness of breath.   Cardiovascular: Negative for chest pain and palpitations.  Gastrointestinal: Negative for abdominal pain and vomiting.  Endocrine: Negative for polyphagia and polyuria.  Genitourinary:  Negative for decreased urine volume Musculoskeletal: Negative for arthralgias and back pain.  Skin: Positive for left foot wound, foul order and discoloration of left foot toes.  Allergic/Immunologic: Negative for immunocompromised state.  Neurological: Negative for tremors, syncope, speech difficulty, weakness, light-headedness and headaches.  Hematological: Does not bruise/bleed easily.  All other systems reviewed and are negative   Past Medical History:  Diagnosis Date  . Aortic root dilatation (HCC)    Aortic root 44 mm, echo, October 08, 2010  . Aortic valve sclerosis    No stenosis, echo, July, 2012  . BPH (benign prostatic hyperplasia)   . CAD (coronary artery disease)    Catheterization 2009, "some narrowing"  . Diabetes mellitus   . Ejection fraction    EF 60%,Echo, October 08, 2010  . Gout   . Hypertension    Past Surgical History:  Procedure Laterality Date  . CARDIAC CATHETERIZATION  2009  . CIRCUMCISION  09/2009    Social History:  reports that he quit smoking about 14 years ago. His smoking use included cigarettes. He started smoking about 51 years ago. He has a 12.00 pack-year smoking history. He has never used smokeless tobacco. He reports previous alcohol use. He reports previous drug use.   Allergies  Allergen Reactions  . Ciprofloxacin     Family History  Problem Relation Age of Onset  . Hypertension Father   . Diabetes Father   . Hypertension Mother   . Diabetes Mother   . Breast cancer Mother     Prior to Admission medications   Medication Sig Start Date End Date Taking? Authorizing Provider  amLODipine (NORVASC) 10 MG  tablet  07/09/16   [provider]  aspirin EC 81 MG tablet Take 81 mg by mouth daily. Reported on 10/06/2015    [provider]  CARTIA XT 240 MG 24 hr capsule Take 240 mg by mouth daily. 04/29/19   [provider]  carvedilol (COREG) 6.25 MG tablet Take 6.25 mg by mouth 2 (two) times daily. 03/23/19   [provider]  cloNIDine (CATAPRES) 0.2 MG tablet Take 0.2 mg by mouth 2 (two) times daily. 10/23/17   [provider]  glipiZIDE (GLUCOTROL) 10 MG tablet Take 1 tablet by mouth Twice daily. 12/03/10   [provider]  Insulin Glargine (TOUJEO SOLOSTAR [place]) Inject into the skin as directed.    [provider]  insulin NPH-regular Human (70-30) 100 UNIT/ML injection Inject into the skin.    [provider]  JANUVIA 25 MG tablet Take 25 mg by mouth daily. 05/13/19   [provider]  lisinopril (PRINIVIL,ZESTRIL) 40 MG tablet  09/03/16   [provider]  neomycin-polymyxin b-dexamethasone (MAXITROL) 3.5-10000-0.1 OINT APPLY A SMALL AMOUNT OF OINTMENT TWICE DAILY TO EYELID 10/06/17   [provider]  NOVOLIN N RELION 100 UNIT/ML injection SMARTSIG:70 Unit(s) SUB-Q Twice Daily 06/06/19   [provider]  oxybutynin (DITROPAN-XL) 10 MG 24 hr tablet Take 10 mg by mouth daily. 03/16/19   [provider]  traMADol (ULTRAM) 50 MG tablet Take 1 tablet (50 mg total) by mouth every 12 (twelve) hours as needed. 12/03/17   Petrarca, Mike Craze, PA-C  TRESIBA FLEXTOUCH 200 UNIT/ML SOPN INJECT 80 UNITS SUBCUTANEOUSLY ONCE DAILY MAXIMUM DAILY DOSE OF 100 UNITS 09/05/17   [provider]    Physical Exam: BP 119/73 (BP Location: Left Arm)   Pulse 67   Temp 99 F (37.2 C) (Oral)   Resp 16   Ht 6\' 2"  (1.88 m)   Wt 113.4 kg   SpO2 98%   BMI 32.10 kg/m   . General: 69 y.o. year-old male well developed well nourished in no acute distress.  Alert and oriented x3. Marland Kitchen HEENT: Normocephalic, atraumatic, EOMI . Neck: Supple, trachea midline . Cardiovascular: Regular rate and rhythm with no rubs or gallops.  No thyromegaly or JVD noted.  Positive Doppler pulse to both left DP and PT. Marland Kitchen Respiratory: Clear to auscultation with no wheezes or rales. Good inspiratory effort. . Abdomen: Soft nontender nondistended with normal bowel sounds x4  quadrants. . Muskuloskeletal: No cyanosis, clubbing or edema noted bilaterally . Neuro: CN II-XII intact, strength, sensation, reflexes . Skin: Left 1st 3 toes discoloration with desquamated skin and dusky color to second toes as well as distal forefoot and plantar surface dusky appearance.  Positive abrasion over the sternum.   Marland Kitchen Psychiatry: Judgement and insight appear normal. Mood is appropriate for condition and setting          Labs on Admission:  Basic Metabolic Panel: Recent Labs  Lab 11/02/19 1715  NA 137  K 3.9  CL 96*  CO2 29  GLUCOSE 118*  BUN 25*  CREATININE 1.31*  CALCIUM 8.9   Liver Function Tests: Recent Labs  Lab 11/02/19 1715  AST 42*  ALT 50*  ALKPHOS 70  BILITOT 0.6  PROT 8.9*  ALBUMIN 3.3*   No results for input(s): LIPASE, AMYLASE in the last 168 hours. No results for input(s): AMMONIA in the last 168 hours. CBC: Recent Labs  Lab 11/02/19 1715  WBC 21.0*  NEUTROABS 16.1*  HGB 12.1*  HCT  37.3*  MCV 91.0  PLT 531*   Cardiac Enzymes: No results for input(s): CKTOTAL, CKMB, CKMBINDEX, TROPONINI in the last 168 hours.  BNP (last 3 results) No results for input(s): BNP in the last 8760 hours.  ProBNP (last 3 results) No results for input(s): PROBNP in the last 8760 hours.  CBG: No results for input(s): GLUCAP in the last 168 hours.  Radiological Exams on Admission: DG Chest 2 View  Result Date: 11/02/2019 CLINICAL DATA:  [place], left foot pain EXAM: CHEST - 2 VIEW COMPARISON:  09/01/2019 FINDINGS: The heart size and mediastinal contours are within normal limits. Both lungs are clear. The visualized skeletal structures are unremarkable. IMPRESSION: No active cardiopulmonary disease. Electronically Signed   By: Randa Ngo M.D.   On: 11/02/2019 18:41   DG Foot Complete Left  Result Date: 11/02/2019 CLINICAL DATA:  Pulled a piece of glass out of his left foot 1 week ago with subsequent redness and bruising. EXAM: LEFT FOOT - COMPLETE 3+  VIEW COMPARISON:  None. FINDINGS: There is no evidence of fracture or dislocation. There is no evidence of arthropathy or other focal bone abnormality. Mild focal soft tissue swelling is seen along the dorsal aspect of the distal left foot. No radiopaque soft tissue foreign bodies are identified. IMPRESSION: Mild focal dorsal soft tissue swelling with no radiopaque soft tissue foreign bodies identified. Electronically Signed   By: Virgina Norfolk M.D.   On: 11/02/2019 16:17    EKG: I independently viewed the EKG done and my findings are as followed: Normal sinus rhythm with rate of 74 bpm with nonspecific ST abnormality.  Assessment/Plan Present on Admission: . Wound, open, foot, left, initial encounter . Hypertension  Principal Problem:   Wound, open, foot, left, initial encounter Active Problems:   Hypertension   Cellulitis and abscess of toe of left foot   Type 2 diabetes mellitus with foot ulcer (HCC)   Hyperlipidemia   AKI (acute kidney injury) ([place])   Prolonged QT interval   Obesity (BMI 30.0-34.9)    Left foot wound with cellulitis  Left foot x-ray showed mild focal dorsal soft tissue swelling with no radiopaque soft tissue foreign bodies identified. Patient presents with left foot wound with discoloration of first 3 toes and erythematous rash that was warm to touch He was started on IV vancomycin and ceftriaxone, we shall continue vancomycin and Zosyn Continue IV morphine 2 mg every 4 hours as needed for moderate/severe pain Continue wound care General surgery was already consulted by ED team and will see patient in the morning  Leukocytosis in setting of above Continue treatment as per above  Type 2 diabetes mellitus Continue insulin sliding scale and hypoglycemia protocol  Essential hypertension (controlled) Continue amlodipine and Coreg  Hyperlipidemia Continue atorvastatin  Acute kidney injury with no known history of CKD BUN/creatinine 24/1.31 (no prior labs  for comparison Continue IV hydration Renally adjust medications, avoid nephrotoxic agents/dehydration/hypotension  Prolonged QTc QTc 497 Avoid QT prolonging drugs Magnesium level will be checked Repeat EKG in the morning  Obesity (BMI 32.10) Patient counseled on diet modification and exercise Patient will follow up with PCP for weight loss program  DVT prophylaxis: SCDs (no indication for chemoprophylaxis at this time due to possible surgical intervention in the morning)  Code Status: Full code  Family Communication: None at bedside  Disposition Plan:  Patient is from:  home Anticipated DC to:                   SNF or family members home Anticipated DC date:               2-3 days Anticipated DC barriers:         Patient requires IV antibiotics due to left toe wound and cellulitis.  He also requires surgical evaluation with possible surgical intervention due to the wound   Consults called: General surgery  Admission status: Inpatient    Bernadette Hoit MD [facility] Pager [phone]  If 7PM-7AM, please contact night-coverage [email or web address] Password [facility]  11/02/2019, 8:57 PM

## 2019-11-02 NOTE — ED Triage Notes (Signed)
Left foot painful, cool to touch and discolored onset 1 week ago, also has foul odor

## 2019-11-03 ENCOUNTER — Inpatient Hospital Stay (HOSPITAL_COMMUNITY): Payer: Medicare HMO

## 2019-11-03 ENCOUNTER — Other Ambulatory Visit: Payer: Self-pay

## 2019-11-03 DIAGNOSIS — I96 Gangrene, not elsewhere classified: Secondary | ICD-10-CM

## 2019-11-03 DIAGNOSIS — Z20822 Contact with and (suspected) exposure to covid-19: Secondary | ICD-10-CM | POA: Diagnosis not present

## 2019-11-03 DIAGNOSIS — E785 Hyperlipidemia, unspecified: Secondary | ICD-10-CM | POA: Diagnosis not present

## 2019-11-03 DIAGNOSIS — N179 Acute kidney failure, unspecified: Secondary | ICD-10-CM | POA: Diagnosis not present

## 2019-11-03 DIAGNOSIS — E1165 Type 2 diabetes mellitus with hyperglycemia: Secondary | ICD-10-CM | POA: Diagnosis not present

## 2019-11-03 DIAGNOSIS — D62 Acute posthemorrhagic anemia: Secondary | ICD-10-CM | POA: Diagnosis not present

## 2019-11-03 DIAGNOSIS — L03116 Cellulitis of left lower limb: Secondary | ICD-10-CM | POA: Diagnosis not present

## 2019-11-03 DIAGNOSIS — L02612 Cutaneous abscess of left foot: Secondary | ICD-10-CM | POA: Diagnosis not present

## 2019-11-03 DIAGNOSIS — E1152 Type 2 diabetes mellitus with diabetic peripheral angiopathy with gangrene: Secondary | ICD-10-CM | POA: Diagnosis not present

## 2019-11-03 DIAGNOSIS — I10 Essential (primary) hypertension: Secondary | ICD-10-CM | POA: Diagnosis not present

## 2019-11-03 LAB — CBC
HCT: 36 % — ABNORMAL LOW (ref 39.0–52.0)
Hemoglobin: 11.2 g/dL — ABNORMAL LOW (ref 13.0–17.0)
MCH: 29.2 pg (ref 26.0–34.0)
MCHC: 31.1 g/dL (ref 30.0–36.0)
MCV: 93.8 fL (ref 80.0–100.0)
Platelets: 465 10*3/uL — ABNORMAL HIGH (ref 150–400)
RBC: 3.84 MIL/uL — ABNORMAL LOW (ref 4.22–5.81)
RDW: 13.8 % (ref 11.5–15.5)
WBC: 17.7 10*3/uL — ABNORMAL HIGH (ref 4.0–10.5)
nRBC: 0 % (ref 0.0–0.2)

## 2019-11-03 LAB — COMPREHENSIVE METABOLIC PANEL
ALT: 39 U/L (ref 0–44)
AST: 30 U/L (ref 15–41)
Albumin: 2.9 g/dL — ABNORMAL LOW (ref 3.5–5.0)
Alkaline Phosphatase: 65 U/L (ref 38–126)
Anion gap: 11 (ref 5–15)
BUN: 23 mg/dL (ref 8–23)
CO2: 27 mmol/L (ref 22–32)
Calcium: 8.7 mg/dL — ABNORMAL LOW (ref 8.9–10.3)
Chloride: 100 mmol/L (ref 98–111)
Creatinine, Ser: 1.03 mg/dL (ref 0.61–1.24)
GFR calc Af Amer: >60 mL/min (ref >60)
GFR calc non Af Amer: >60 mL/min (ref >60)
Glucose, Bld: 114 mg/dL — ABNORMAL HIGH (ref 70–99)
Potassium: 3.6 mmol/L (ref 3.5–5.1)
Sodium: 138 mmol/L (ref 135–145)
Total Bilirubin: 0.5 mg/dL (ref 0.3–1.2)
Total Protein: 7.7 g/dL (ref 6.5–8.1)

## 2019-11-03 LAB — GLUCOSE, CAPILLARY
Glucose-Capillary: 153 mg/dL — ABNORMAL HIGH (ref 70–99)
Glucose-Capillary: 122 mg/dL — ABNORMAL HIGH (ref 70–99)
Glucose-Capillary: 205 mg/dL — ABNORMAL HIGH (ref 70–99)
Glucose-Capillary: 252 mg/dL — ABNORMAL HIGH (ref 70–99)

## 2019-11-03 LAB — SURGICAL PCR SCREEN
MRSA, PCR: NEGATIVE
Staphylococcus aureus: POSITIVE — AB

## 2019-11-03 LAB — PROTIME-INR
INR: 1.2 (ref 0.8–1.2)
Prothrombin Time: 15.1 seconds (ref 11.4–15.2)

## 2019-11-03 LAB — HEMOGLOBIN A1C
Hgb A1c MFr Bld: 7.2 % — ABNORMAL HIGH (ref 4.8–5.6)
Mean Plasma Glucose: 159.94 mg/dL

## 2019-11-03 LAB — PHOSPHORUS: Phosphorus: 4 mg/dL (ref 2.5–4.6)

## 2019-11-03 LAB — HIV ANTIBODY (ROUTINE TESTING W REFLEX): HIV Screen 4th Generation wRfx: NONREACTIVE

## 2019-11-03 LAB — MAGNESIUM: Magnesium: 2.3 mg/dL (ref 1.7–2.4)

## 2019-11-03 LAB — APTT: aPTT: 44 seconds — ABNORMAL HIGH (ref 24–36)

## 2019-11-03 MED ORDER — LACTATED RINGERS IV SOLN: INTRAVENOUS | Status: AC

## 2019-11-03 MED ORDER — CHLORHEXIDINE GLUCONATE CLOTH 2 % EX PADS
6.0000 | MEDICATED_PAD | Freq: Every day | CUTANEOUS | Status: DC
  Administered 2019-11-03: 6 via TOPICAL

## 2019-11-03 MED ORDER — INSULIN GLARGINE 100 UNIT/ML ~~LOC~~ SOLN
30.0000 [IU] | Freq: Every day | SUBCUTANEOUS | Status: DC
  Administered 2019-11-03 – 2019-11-04 (×2): 30 [IU] via SUBCUTANEOUS
  Filled 2019-11-03 (×5): qty 0.3

## 2019-11-03 MED ORDER — ENOXAPARIN SODIUM 40 MG/0.4ML ~~LOC~~ SOLN
40.0000 mg | SUBCUTANEOUS | Status: DC
  Administered 2019-11-03 – 2019-11-08 (×6): 40 mg via SUBCUTANEOUS
  Filled 2019-11-03 (×6): qty 0.4

## 2019-11-03 MED ORDER — OCUVITE-LUTEIN PO CAPS
1.0000 | ORAL_CAPSULE | Freq: Every day | ORAL | Status: DC
  Administered 2019-11-03 – 2019-11-09 (×6): 1 via ORAL
  Filled 2019-11-03 (×7): qty 1

## 2019-11-03 MED ORDER — PROSOURCE PLUS PO LIQD
30.0000 mL | Freq: Two times a day (BID) | ORAL | Status: DC
  Administered 2019-11-03 – 2019-11-09 (×12): 30 mL via ORAL
  Filled 2019-11-03 (×10): qty 30

## 2019-11-03 MED ORDER — ENSURE ENLIVE PO LIQD
237.0000 mL | Freq: Two times a day (BID) | ORAL | Status: DC
  Administered 2019-11-03 – 2019-11-09 (×11): 237 mL via ORAL

## 2019-11-03 NOTE — Progress Notes (Signed)
CRITICAL VALUE ALERT  Critical Value:  MRSA PCR positive  Date & Time Notied:  11/03/19 0750  Provider Notified: Roderic Palau  Orders Received/Actions taken: standing orders implemented

## 2019-11-03 NOTE — Consult Note (Signed)
Reason for Consult: Cellulitis and ischemia, left foot Referring Physician: Dr. Ina Homes Alfred Miller is an 69 y.o. male.  HPI: Patient is a 69 year old white male with a longstanding history of diabetes mellitus who presented to the emergency room with a 1 week history of worsening pain and infection in the left foot.  He states he stepped on glass and sustained a wound to the left foot which has since worsened.  He states he did see her primary care physician but antibiotics were not started.  Over the past few days, he started having dark toes develop in the left foot.  Past Medical History:  Diagnosis Date  . Anginal pain ([place])   . Aortic root dilatation (H[facility])    Aortic root 44 mm, echo, October 08, 2010  . Aortic valve sclerosis    No stenosis, echo, July, 2012  . BPH (benign prostatic hyperplasia)   . CAD (coronary artery disease)    Catheterization 2009, "some narrowing"  . Diabetes mellitus   . Dyspnea   . Ejection fraction    EF 60%,Echo, October 08, 2010  . Gout   . Hypertension     Past Surgical History:  Procedure Laterality Date  . CARDIAC CATHETERIZATION  2009  . CIRCUMCISION  09/2009    Family History  Problem Relation Age of Onset  . Hypertension Father   . Diabetes Father   . Hypertension Mother   . Diabetes Mother   . Breast cancer Mother     Social History:  reports that he quit smoking about 14 years ago. His smoking use included cigarettes. He started smoking about 51 years ago. He has a 12.00 pack-year smoking history. He has never used smokeless tobacco. He reports previous alcohol use. He reports previous drug use.  Allergies:  Allergies  Allergen Reactions  . Ciprofloxacin     Patient states he is not allergic to it.     Medications: I have reviewed the patient's current medications.  Results for orders placed or performed during the hospital encounter of 11/02/19 (from the past 48 hour(s))  SARS Coronavirus 2 by RT PCR (hospital order, performed in  [facility] hospital lab) Nasopharyngeal Nasopharyngeal Swab     Status: None   Collection Time: 11/02/19  4:48 PM   Specimen: Nasopharyngeal Swab  Result Value Ref Range   SARS Coronavirus 2 NEGATIVE NEGATIVE    Comment: (NOTE) SARS-CoV-2 target nucleic acids are NOT DETECTED.  The SARS-CoV-2 RNA is generally detectable in upper and lower respiratory specimens during the acute phase of infection. The lowest concentration of SARS-CoV-2 viral copies this assay can detect is 250 copies / mL. A negative result does not preclude SARS-CoV-2 infection and should not be used as the sole basis for treatment or other patient management decisions.  A negative result may occur with improper specimen collection / handling, submission of specimen other than nasopharyngeal swab, presence of viral mutation(s) within the areas targeted by this assay, and inadequate number of viral copies (<250 copies / mL). A negative result must be combined with clinical observations, patient history, and epidemiological information.  Fact Sheet for Patients:   StrictlyIdeas.no  Fact Sheet for Healthcare Providers: BankingDealers.co.za  This test is not yet approved or  cleared by the [place] FDA and has been authorized for detection and/or diagnosis of SARS-CoV-2 by FDA under an Emergency Use Authorization (EUA).  This EUA will remain in effect (meaning this test can be used) for the duration of the COVID-19 declaration  under Section 564(b)(1) of the Act, 21 U.S.C. section 360bbb-3(b)(1), unless the authorization is terminated or revoked sooner.  Performed at [facility], [address] [place], [place] [address]   Blood Culture (routine x 2)     Status: None (Preliminary result)   Collection Time: 11/02/19  5:05 PM   Specimen: BLOOD RIGHT HAND  Result Value Ref Range   Specimen Description      BLOOD RIGHT HAND BOTTLES DRAWN AEROBIC AND ANAEROBIC    Special Requests      Blood Culture results may not be optimal due to an inadequate volume of blood received in culture bottles   Culture      NO GROWTH < 24 HOURS Performed at [facility], [address], [place], [place] [address]    Report Status PENDING   Lactic acid, plasma     Status: None   Collection Time: 11/02/19  5:15 PM  Result Value Ref Range   Lactic Acid, Venous 1.2 0.5 - 1.9 mmol/L    Comment: Performed at [facility], [address], [place], [place] [address]  Comprehensive metabolic panel     Status: Abnormal   Collection Time: 11/02/19  5:15 PM  Result Value Ref Range   Sodium 137 135 - 145 mmol/L   Potassium 3.9 3.5 - 5.1 mmol/L   Chloride 96 (L) 98 - 111 mmol/L   CO2 29 22 - 32 mmol/L   Glucose, Bld 118 (H) 70 - 99 mg/dL    Comment: Glucose reference range applies only to samples taken after fasting for at least 8 hours.   BUN 25 (H) 8 - 23 mg/dL   Creatinine, Ser 1.31 (H) 0.61 - 1.24 mg/dL   Calcium 8.9 8.9 - 10.3 mg/dL   Total Protein 8.9 (H) 6.5 - 8.1 g/dL   Albumin 3.3 (L) 3.5 - 5.0 g/dL   AST 42 (H) 15 - 41 U/L   ALT 50 (H) 0 - 44 U/L   Alkaline Phosphatase 70 38 - 126 U/L   Total Bilirubin 0.6 0.3 - 1.2 mg/dL   GFR calc non Af Amer 55 (L) >60 mL/min   GFR calc Af Amer >60 >60 mL/min   Anion gap 12 5 - 15    Comment: Performed at [facility], [address] [place], [place] [address]  CBC WITH DIFFERENTIAL     Status: Abnormal   Collection Time: 11/02/19  5:15 PM  Result Value Ref Range   WBC 21.0 (H) 4.0 - 10.5 K/uL   RBC 4.10 (L) 4.22 - 5.81 MIL/uL   Hemoglobin 12.1 (L) 13.0 - 17.0 g/dL   HCT 37.3 (L) 39 - 52 %   MCV 91.0 80.0 - 100.0 fL   MCH 29.5 26.0 - 34.0 pg   MCHC 32.4 30.0 - 36.0 g/dL   RDW 13.6 11.5 - 15.5 %   Platelets 531 (H) 150 - 400 K/uL   nRBC 0.0 0.0 - 0.2 %   Neutrophils Relative % 76 %   Neutro Abs 16.1 (H) 1.7 - 7.7 K/uL   Lymphocytes Relative 11 %   Lymphs Abs 2.2 0.7 - 4.0 K/uL   Monocytes Relative 10 %   Monocytes  Absolute 2.1 (H) 0 - 1 K/uL   Eosinophils Relative 1 %   Eosinophils Absolute 0.1 0 - 0 K/uL   Basophils Relative 0 %   Basophils Absolute 0.1 0 - 0 K/uL   Immature Granulocytes 2 %   Abs Immature Granulocytes 0.32 (H) 0.00 -  0.07 K/uL    Comment: Performed at [facility], [address], [place], [place] [address]  Protime-INR     Status: None   Collection Time: 11/02/19  5:15 PM  Result Value Ref Range   Prothrombin Time 14.6 11.4 - 15.2 seconds   INR 1.2 0.8 - 1.2    Comment: (NOTE) INR goal varies based on device and disease states. Performed at [facility], [address], [place], [place] [address]   APTT     Status: Abnormal   Collection Time: 11/02/19  5:15 PM  Result Value Ref Range   aPTT 44 (H) 24 - 36 seconds    Comment:        IF BASELINE aPTT IS ELEVATED, SUGGEST PATIENT RISK ASSESSMENT BE USED TO DETERMINE APPROPRIATE ANTICOAGULANT THERAPY. Performed at [facility], [address], [place], [place] [address]   Blood Culture (routine x 2)     Status: None (Preliminary result)   Collection Time: 11/02/19  5:15 PM   Specimen: Left Antecubital; Blood  Result Value Ref Range   Specimen Description      LEFT ANTECUBITAL BOTTLES DRAWN AEROBIC AND ANAEROBIC   Special Requests Blood Culture adequate volume    Culture      NO GROWTH < 24 HOURS Performed at [facility], [address], [place], [place] [address]    Report Status PENDING   Lactic acid, plasma     Status: None   Collection Time: 11/02/19  7:01 PM  Result Value Ref Range   Lactic Acid, Venous 1.4 0.5 - 1.9 mmol/L    Comment: Performed at [facility], [address], [place], [place] [address]  Hemoglobin A1c     Status: Abnormal   Collection Time: 11/02/19  7:01 PM  Result Value Ref Range   Hgb A1c MFr Bld 7.2 (H) 4.8 - 5.6 %    Comment: (NOTE) Pre diabetes:          5.7%-6.4%  Diabetes:              >6.4%  Glycemic control for   <7.0% adults with diabetes    Mean Plasma Glucose 159.94 mg/dL     Comment: Performed at [facility] [address] [place], [place] [address]  CBG monitoring, ED     Status: Abnormal   Collection Time: 11/02/19  9:28 PM  Result Value Ref Range   Glucose-Capillary 143 (H) 70 - 99 mg/dL    Comment: Glucose reference range applies only to samples taken after fasting for at least 8 hours.  Surgical PCR screen     Status: Abnormal   Collection Time: 11/02/19 11:00 PM   Specimen: Nasal Mucosa; Nasal Swab  Result Value Ref Range   MRSA, PCR NEGATIVE NEGATIVE   Staphylococcus aureus POSITIVE (A) NEGATIVE    Comment: RESULT CALLED TO, READ BACK BY AND VERIFIED WITH: BENGTSON,T AT 0745 ON 11/03/19 BY HUFFINES,S (NOTE) The Xpert SA Assay (FDA approved for NASAL specimens in patients 14 years of age and older), is one component of a comprehensive surveillance program. It is not intended to diagnose infection nor to guide or monitor treatment. Performed at [facility], [address], [place], [place] [address]   Comprehensive metabolic panel     Status: Abnormal   Collection Time: 11/03/19  4:05 AM  Result Value Ref Range   Sodium 138 135 - 145 mmol/L   Potassium 3.6 3.5 - 5.1 mmol/L   Chloride 100 98 - 111 mmol/L  CO2 27 22 - 32 mmol/L   Glucose, Bld 114 (H) 70 - 99 mg/dL    Comment: Glucose reference range applies only to samples taken after fasting for at least 8 hours.   BUN 23 8 - 23 mg/dL   Creatinine, Ser 1.03 0.61 - 1.24 mg/dL   Calcium 8.7 (L) 8.9 - 10.3 mg/dL   Total Protein 7.7 6.5 - 8.1 g/dL   Albumin 2.9 (L) 3.5 - 5.0 g/dL   AST 30 15 - 41 U/L   ALT 39 0 - 44 U/L   Alkaline Phosphatase 65 38 - 126 U/L   Total Bilirubin 0.5 0.3 - 1.2 mg/dL   GFR calc non Af Amer >60 >60 mL/min   GFR calc Af Amer >60 >60 mL/min   Anion gap 11 5 - 15    Comment: Performed at [facility], [address] [place], [place] [address]  CBC     Status: Abnormal   Collection Time: 11/03/19  4:05 AM  Result Value Ref Range   WBC 17.7 (H) 4.0 - 10.5  K/uL   RBC 3.84 (L) 4.22 - 5.81 MIL/uL   Hemoglobin 11.2 (L) 13.0 - 17.0 g/dL   HCT 36.0 (L) 39 - 52 %   MCV 93.8 80.0 - 100.0 fL   MCH 29.2 26.0 - 34.0 pg   MCHC 31.1 30.0 - 36.0 g/dL   RDW 13.8 11.5 - 15.5 %   Platelets 465 (H) 150 - 400 K/uL   nRBC 0.0 0.0 - 0.2 %    Comment: Performed at [facility], [address] [place], [place] [address]  Protime-INR     Status: None   Collection Time: 11/03/19  4:05 AM  Result Value Ref Range   Prothrombin Time 15.1 11.4 - 15.2 seconds   INR 1.2 0.8 - 1.2    Comment: (NOTE) INR goal varies based on device and disease states. Performed at [facility], [address], [place], [place] [address]   APTT     Status: Abnormal   Collection Time: 11/03/19  4:05 AM  Result Value Ref Range   aPTT 44 (H) 24 - 36 seconds    Comment:        IF BASELINE aPTT IS ELEVATED, SUGGEST PATIENT RISK ASSESSMENT BE USED TO DETERMINE APPROPRIATE ANTICOAGULANT THERAPY. Performed at [facility], [address], [place], [place] [address]   Magnesium     Status: None   Collection Time: 11/03/19  4:05 AM  Result Value Ref Range   Magnesium 2.3 1.7 - 2.4 mg/dL    Comment: Performed at [facility], [address] [place], [place] [address]  Phosphorus     Status: None   Collection Time: 11/03/19  4:05 AM  Result Value Ref Range   Phosphorus 4.0 2.5 - 4.6 mg/dL    Comment: Performed at [facility], [address], [place], [place] [address]  Glucose, capillary     Status: Abnormal   Collection Time: 11/03/19  7:53 AM  Result Value Ref Range   Glucose-Capillary 153 (H) 70 - 99 mg/dL    Comment: Glucose reference range applies only to samples taken after fasting for at least 8 hours.    DG Chest 2 View  Result Date: 11/02/2019 CLINICAL DATA:  [place], left foot pain EXAM: CHEST - 2 VIEW COMPARISON:  09/01/2019 FINDINGS: The heart size and mediastinal contours are within normal limits. Both lungs are clear. The visualized skeletal structures are unremarkable.  IMPRESSION: No active cardiopulmonary disease.  Electronically Signed   By: Randa Ngo M.D.   On: 11/02/2019 18:41   DG Foot Complete Left  Result Date: 11/02/2019 CLINICAL DATA:  Pulled a piece of glass out of his left foot 1 week ago with subsequent redness and bruising. EXAM: LEFT FOOT - COMPLETE 3+ VIEW COMPARISON:  None. FINDINGS: There is no evidence of fracture or dislocation. There is no evidence of arthropathy or other focal bone abnormality. Mild focal soft tissue swelling is seen along the dorsal aspect of the distal left foot. No radiopaque soft tissue foreign bodies are identified. IMPRESSION: Mild focal dorsal soft tissue swelling with no radiopaque soft tissue foreign bodies identified. Electronically Signed   By: Virgina Norfolk M.D.   On: 11/02/2019 16:17    ROS:  Pertinent items are noted in HPI.  Blood pressure (!) 162/82, pulse 82, temperature 99.8 F (37.7 C), temperature source Oral, resp. rate 18, height 6\' 2"  (1.88 m), weight 110.5 kg, SpO2 94 %. Physical Exam: Pleasant white male no acute distress Head is normocephalic, atraumatic Lungs are clear to auscultation with good breath sounds bilaterally Heart examination reveals a regular rate and rhythm without S3, S4, murmurs Extremity examination reveals bilateral femoral pulses present.  There is thin skin bilaterally.  I was able to palpate a dorsalis pedis pulse on the right foot.  The left foot has significant gangrenous changes in the second and third toes with extension of soft tissue necrosis extending up proximally along the second and third tarsal regions.  The left great toe is cyanotic.  There is some streaky erythematous vasculitis extending up to the ankle.  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  Doppler studies pending.  Unfortunately, I do not feel the left foot is salvageable.  Will need to check ABIs to see whether or not  any further vascular intervention is needed.  Further management is pending those results.  Would continue vancomycin and Zosyn as prescribed.  Will follow with you.  Aviva Signs 11/03/2019, 10:37 AM

## 2019-11-03 NOTE — Progress Notes (Signed)
Initial Nutrition Assessment  DOCUMENTATION CODES:   Obesity unspecified  INTERVENTION:  Ensure Enlive po BID, each supplement provides 350 kcal and 20 grams of protein  ProSource 30 ml po BID, each supplement provides 100 kcal and 15 grams of protein  Ocuvite daily for wound healing (provides zinc, vitamin A, vitamin C, Vitamin E, copper, and selenium)  Dysphagia 3 (chopped meats)  NUTRITION DIAGNOSIS:   Increased nutrient needs related to wound healing (cellulitis;ischemia of left foot) as evidenced by estimated needs.   GOAL:   Patient will meet greater than or equal to 90% of their needs   MONITOR:   PO intake, Weight trends, Labs, I & O's, Supplement acceptance, Skin  REASON FOR ASSESSMENT:   Rounds    ASSESSMENT:  69 year old male admitted with cellulitis and ischemia of left foot. Past medical history significant for DM2, BPH, CAD, Aortic valve sclerosis, Gout, HTN  Surgery following, per notes significant gangrenous changes in second and third toes with extension of soft tissue necrosis extending along second and third tarsal regions, left great to is cyanotic. Left foot is not felt to be salvageable. Further management pending Doppler study results.   Met with pt bedside this afternoon. He reports good po intake of lunch, says he eats like a "horse at home" recalls regular diet, typically eats 4 meals/day. Patient denies chewing/swallowing difficulties, he does wear dentures at home, however he does have dentures with him, appreciative of RD offer of mechanically altered dysphagia 3 diet. Will update in [facility].   Current wt 243.1 lb Per chart, weights have trended down ~6 lbs in the last 5 months; insignificant  Medications reviewed and include: SSI IVF: Lactated ringers @ 150 ml/hr IVPB: Zosyn, Vancomycin Labs: CBGs 122,153,143, WBC 17.7 (H) Lab Results  Component Value Date   HGBA1C 7.2 (H) 11/02/2019    NUTRITION - FOCUSED PHYSICAL  EXAM: Completed; no fat/mucle depletions  Diet Order:   Diet Order            Diet Carb Modified Fluid consistency: Thin; Room service appropriate? Yes  Diet effective now                 EDUCATION NEEDS:   No education needs have been identified at this time  Skin:  Skin Assessment: Skin Integrity Issues: Skin Integrity Issues:: Other (Comment) Other: open wound; left foot  Last BM:  8/17  Height:   Ht Readings from Last 1 Encounters:  11/02/19 $RemoveB'6\' 2"'[id number]$  (1.88 m)    Weight:   Wt Readings from Last 1 Encounters:  11/02/19 110.5 kg    Ideal Body Weight:  86.4 kg  BMI:  Body mass index is 31.28 kg/m.  Estimated Nutritional Needs:   Kcal:  3785-8850  Protein:  115-125  Fluid:  >/= 2.3 L/day   Lajuan Lines, RD, LDN Clinical Nutrition After Hours/Weekend Pager # in [phone]

## 2019-11-03 NOTE — Progress Notes (Signed)
**Note De-identified  Obfuscation** EKG complete and placed in patient chart 

## 2019-11-03 NOTE — Progress Notes (Signed)
Segmental doppler study shows significant left lower extremity vascular disease in left thigh to calf, suggestive of possible popliteal disease as well as inflow disease.  Will need Vascular Surgery consultation to assess for possible revascularization prior to any amputation.  Discussed with Dr. Roderic Palau.

## 2019-11-03 NOTE — Progress Notes (Signed)
PROGRESS NOTE    Alfred Miller  RXV:[id number] DOB: Apr 22, 1950 DOA: 11/02/2019 PCP: Glenda Chroman, MD    Brief Narrative:  HPI: Alfred Miller is a 69 y.o. male with medical history significant for type 2 diabetes mellitus, essential hypertension, CAD who presents to the emergency department due to worsening left foot wound.  Patient states that he accidentally stepped on a glass over a week ago and sustained a wound of left foot which has been worsening since onset.  He has since seen his PCP after sustaining the wound, but there was no antibiotics required at that time per patient.  Over the past 2 days, he noted discoloration to the left toes and has since developed subjective fever and chills, patient also noted a foul odor from the wound which prompted him to go to the ED for further evaluation.  He denies nausea, vomiting, headache, blurry vision, numbness or tingling.  ED Course:  In the emergency department, he was hemodynamically stable.  Work-up in the ED showed leukocytosis with a left shift.  BUN/creatinine 25/1.31 (no prior labs for comparison).  Albumin 3.3, AST 42, ALT 50.  Left foot x-ray showed mild focal dorsal soft tissue swelling with no radiopaque soft tissue foreign bodies identified. He was started on IV Vancomycin and ceftriaxone.   Assessment & Plan:   Principal Problem:   Wound, open, foot, left, initial encounter Active Problems:   Hypertension   Cellulitis and abscess of toe of left foot   Type 2 diabetes mellitus with foot ulcer (HCC)   Hyperlipidemia   AKI (acute kidney injury) ([place])   Prolonged QT interval   Obesity (BMI 30.0-34.9)   Gangrene of left foot ([place])   1. Left foot gangrene.  Seen by general surgery.  Currently on IV antibiotics.  Arterial Doppler studies done show ABI of 0.6 on the left side indicating peripheral vascular disease.  Case reviewed with Dr. Donnetta Hutching on-call for vascular surgery.  It was felt that if patient's foot is not salvageable and  he needs below the knee amputation, he would not be a candidate for revascularization at this time.  Discussed with Dr. Arnoldo Morale who will plan on below the knee amputation 2. Insulin-dependent diabetes, uncontrolled with hyperglycemia.  He is chronically on glipizide and Novolin N.  Will substitute with Lantus while in the hospital, start on sliding scale insulin.  Follow blood sugars. 3. Hypertension.  Continued on Coreg and amlodipine.  Blood pressure currently stable. 4. Acute kidney injury.  Improved with IV fluids.  Secondary to #1. 5. Hyperlipidemia.  Continue statin   DVT prophylaxis: SCDs Start: 11/02/19 1928, lovenox  Code Status: full code Family Communication: left voicemail for wife Disposition Plan: Status is: Inpatient  Remains inpatient appropriate because:Inpatient level of care appropriate due to severity of illness   Dispo: The patient is from: Home              Anticipated d/c is to: Home              Anticipated d/c date is: 3 days              Patient currently is not medically stable to d/c.  Patient will need below the knee amputation for definitive treatment      Consultants:   General surgery  Procedures:     Antimicrobials:   Zosyn 8/17 >  Vancomycin 8/17 >   Subjective: Has pain in his left foot, he is also noted discharge from his foot.  Objective: Vitals:   11/02/19 2239 11/03/19 0445 11/03/19 0905 11/03/19 1439  BP: (!) 168/88 (!) 162/82  (!) 143/78  Pulse: 80 82  68  Resp:  18  18  Temp: 99.1 F (37.3 C) 99.8 F (37.7 C)  98.1 F (36.7 C)  TempSrc: Oral Oral    SpO2: 97% 93% 94% 99%  Weight: 110.5 kg     Height: 6\' 2"  (1.88 m)       Intake/Output Summary (Last 24 hours) at 11/03/2019 2013 Last data filed at 11/03/2019 1900 Gross per 24 hour  Intake 1393.92 ml  Output 2350 ml  Net -956.08 ml   Filed Weights   11/02/19 1518 11/02/19 2239  Weight: 113.4 kg 110.5 kg    Examination:  General exam: Appears calm and  comfortable  Respiratory system: Clear to auscultation. Respiratory effort normal. Cardiovascular system: S1 & S2 heard, RRR. No JVD, murmurs, rubs, gallops or clicks. No pedal edema. Gastrointestinal system: Abdomen is nondistended, soft and nontender. No organomegaly or masses felt. Normal bowel sounds heard. Central nervous system: Alert and oriented. No focal neurological deficits. Extremities: Symmetric 5 x 5 power. Skin: gangrenous changes noted in first through third digits of left foot with surrounding erythema which is streaking up the leg. Psychiatry: Judgement and insight appear normal. Mood & affect appropriate.     Data Reviewed: I have personally reviewed following labs and imaging studies  CBC: Recent Labs  Lab 11/02/19 1715 11/03/19 0405  WBC 21.0* 17.7*  NEUTROABS 16.1*  --   HGB 12.1* 11.2*  HCT 37.3* 36.0*  MCV 91.0 93.8  PLT 531* 440*   Basic Metabolic Panel: Recent Labs  Lab 11/02/19 1715 11/03/19 0405  NA 137 138  K 3.9 3.6  CL 96* 100  CO2 29 27  GLUCOSE 118* 114*  BUN 25* 23  CREATININE 1.31* 1.03  CALCIUM 8.9 8.7*  MG  --  2.3  PHOS  --  4.0   GFR: Estimated Creatinine Clearance: 89.5 mL/min (by C-G formula based on SCr of 1.03 mg/dL). Liver Function Tests: Recent Labs  Lab 11/02/19 1715 11/03/19 0405  AST 42* 30  ALT 50* 39  ALKPHOS 70 65  BILITOT 0.6 0.5  PROT 8.9* 7.7  ALBUMIN 3.3* 2.9*   No results for input(s): LIPASE, AMYLASE in the last 168 hours. No results for input(s): AMMONIA in the last 168 hours. Coagulation Profile: Recent Labs  Lab 11/02/19 1715 11/03/19 0405  INR 1.2 1.2   Cardiac Enzymes: No results for input(s): CKTOTAL, CKMB, CKMBINDEX, TROPONINI in the last 168 hours. BNP (last 3 results) No results for input(s): PROBNP in the last 8760 hours. HbA1C: Recent Labs    11/02/19 1901  HGBA1C 7.2*   CBG: Recent Labs  Lab 11/02/19 2128 11/03/19 0753 11/03/19 1137 11/03/19 1706  GLUCAP 143* 153* 122*  205*   Lipid Profile: No results for input(s): CHOL, HDL, LDLCALC, TRIG, CHOLHDL, LDLDIRECT in the last 72 hours. Thyroid Function Tests: No results for input(s): TSH, T4TOTAL, FREET4, T3FREE, THYROIDAB in the last 72 hours. Anemia Panel: No results for input(s): VITAMINB12, FOLATE, FERRITIN, TIBC, IRON, RETICCTPCT in the last 72 hours. Sepsis Labs: Recent Labs  Lab 11/02/19 1715 11/02/19 1901  LATICACIDVEN 1.2 1.4    Recent Results (from the past 240 hour(s))  SARS Coronavirus 2 by RT PCR (hospital order, performed in [facility] hospital lab) Nasopharyngeal Nasopharyngeal Swab     Status: None   Collection Time: 11/02/19  4:48 PM   Specimen:  Nasopharyngeal Swab  Result Value Ref Range Status   SARS Coronavirus 2 NEGATIVE NEGATIVE Final    Comment: (NOTE) SARS-CoV-2 target nucleic acids are NOT DETECTED.  The SARS-CoV-2 RNA is generally detectable in upper and lower respiratory specimens during the acute phase of infection. The lowest concentration of SARS-CoV-2 viral copies this assay can detect is 250 copies / mL. A negative result does not preclude SARS-CoV-2 infection and should not be used as the sole basis for treatment or other patient management decisions.  A negative result may occur with improper specimen collection / handling, submission of specimen other than nasopharyngeal swab, presence of viral mutation(s) within the areas targeted by this assay, and inadequate number of viral copies (<250 copies / mL). A negative result must be combined with clinical observations, patient history, and epidemiological information.  Fact Sheet for Patients:   StrictlyIdeas.no  Fact Sheet for Healthcare Providers: BankingDealers.co.za  This test is not yet approved or  cleared by the [place] FDA and has been authorized for detection and/or diagnosis of SARS-CoV-2 by FDA under an Emergency Use Authorization (EUA).  This EUA  will remain in effect (meaning this test can be used) for the duration of the COVID-19 declaration under Section 564(b)(1) of the Act, 21 U.S.C. section 360bbb-3(b)(1), unless the authorization is terminated or revoked sooner.  Performed at [facility], [address], [place], [place] [address]   Blood Culture (routine x 2)     Status: None (Preliminary result)   Collection Time: 11/02/19  5:05 PM   Specimen: BLOOD RIGHT HAND  Result Value Ref Range Status   Specimen Description   Final    BLOOD RIGHT HAND BOTTLES DRAWN AEROBIC AND ANAEROBIC   Special Requests   Final    Blood Culture results may not be optimal due to an inadequate volume of blood received in culture bottles   Culture   Final    NO GROWTH < 24 HOURS Performed at [facility], [address], [place], [place] [address]    Report Status PENDING  Incomplete  Blood Culture (routine x 2)     Status: None (Preliminary result)   Collection Time: 11/02/19  5:15 PM   Specimen: Left Antecubital; Blood  Result Value Ref Range Status   Specimen Description   Final    LEFT ANTECUBITAL BOTTLES DRAWN AEROBIC AND ANAEROBIC   Special Requests Blood Culture adequate volume  Final   Culture   Final    NO GROWTH < 24 HOURS Performed at [facility], [address], [place], [place] [address]    Report Status PENDING  Incomplete  Surgical PCR screen     Status: Abnormal   Collection Time: 11/02/19 11:00 PM   Specimen: Nasal Mucosa; Nasal Swab  Result Value Ref Range Status   MRSA, PCR NEGATIVE NEGATIVE Final   Staphylococcus aureus POSITIVE (A) NEGATIVE Final    Comment: RESULT CALLED TO, READ BACK BY AND VERIFIED WITH: BENGTSON,T AT 0745 ON 11/03/19 BY HUFFINES,S (NOTE) The Xpert SA Assay (FDA approved for NASAL specimens in patients 13 years of age and older), is one component of a comprehensive surveillance program. It is not intended to diagnose infection nor to guide or monitor treatment. Performed at [facility] [address] [place], [place] [address]          Radiology Studies: DG Chest 2 View  Result Date: 11/02/2019 CLINICAL DATA:  [place], left foot pain EXAM: CHEST - 2 VIEW COMPARISON:  09/01/2019 FINDINGS: The  heart size and mediastinal contours are within normal limits. Both lungs are clear. The visualized skeletal structures are unremarkable. IMPRESSION: No active cardiopulmonary disease. Electronically Signed   By: Randa Ngo M.D.   On: 11/02/2019 18:41   US ARTERIAL SEG MULTIPLE LE (ABI, SEGMENTAL PRESSURES, PVR'S)  Result Date: 11/03/2019 CLINICAL DATA:  Left foot wound.  Black left foot. EXAM: NONINVASIVE PHYSIOLOGIC VASCULAR STUDY OF BILATERAL LOWER EXTREMITIES TECHNIQUE: Non-invasive vascular evaluation of both lower extremities was performed at rest, including calculation of ankle-brachial indices, multiple segmental pressure evaluation, segmental Doppler and segmental pulse volume recording. COMPARISON:  None. FINDINGS: Right Lower Extremity Resting ABI:  1.10 Segmental Pressures: Elevated pressures in the calf likely related to incompressible vessels. Great toe pressure: 88 Arterial Waveforms: Biphasic and triphasic waveforms in the right upper thigh. Biphasic waveforms in the right popliteal artery. Monophasic waveforms in the right ankle. PVRs: Normal PVRs with maintained waveform amplitude. Left Lower Extremity: Resting ABI: 0.64 Segmental Pressures: Significant pressure drop between the lower thigh and the calf. Great toe pressure: Could not be obtained Arterial Waveforms: Biphasic waveform in the left upper thigh. Monophasic waveform in the left mid thigh and left popliteal artery. No significant waveforms at the left ankle. Markedly decreased left digital waveforms compared to the right. PVRs: PVR waveforms are symmetric to the right lower extremity. Other: Symmetric upper extremity pressures. Ankle Brachial index > 1.4 Non diagnostic secondary to incompressible vessel  calcifications 1.0-1.4       Normal 0.9-0.99     Borderline PAD 0.8-0.89     Mild PAD 0.5-0.79     Moderate PAD < 0.5          Severe PAD Toe Brachial Index Normal     >0.65 Moderate  0.53-0.64 Severe     <0.23 Toe Pressures Absolute toe pressure >68mmHg sufficient for wound healing. Toe pressures <49mmHg = critical limb ischemia. IMPRESSION: 1. Right ankle-brachial index is normal, measuring 1.10. 2. Left ankle-brachial index is 0.64 and suggestive for moderate peripheral arterial disease. Significant pressure gradient between the left lower thigh and the left calf is suggestive for occlusive disease in this area. No significant flow identified in the left digital arteries. Electronically Signed   By: Markus Daft M.D.   On: 11/03/2019 17:43   DG Foot Complete Left  Result Date: 11/02/2019 CLINICAL DATA:  Pulled a piece of glass out of his left foot 1 week ago with subsequent redness and bruising. EXAM: LEFT FOOT - COMPLETE 3+ VIEW COMPARISON:  None. FINDINGS: There is no evidence of fracture or dislocation. There is no evidence of arthropathy or other focal bone abnormality. Mild focal soft tissue swelling is seen along the dorsal aspect of the distal left foot. No radiopaque soft tissue foreign bodies are identified. IMPRESSION: Mild focal dorsal soft tissue swelling with no radiopaque soft tissue foreign bodies identified. Electronically Signed   By: Virgina Norfolk M.D.   On: 11/02/2019 16:17        Scheduled Meds:  (feeding supplement) PROSource Plus  30 mL Oral BID BM   amLODipine  10 mg Oral Daily   atorvastatin  10 mg Oral Daily   carvedilol  12.5 mg Oral BID WC   Chlorhexidine Gluconate Cloth  6 each Topical Q0600   feeding supplement (ENSURE ENLIVE)  237 mL Oral BID BM   insulin aspart  0-5 Units Subcutaneous QHS   insulin aspart  0-9 Units Subcutaneous TID WC   multivitamin-lutein  1 capsule Oral Daily   mupirocin  ointment  1 application Nasal BID   Continuous  Infusions:  lactated ringers 100 mL/hr at 11/03/19 1849   piperacillin-tazobactam (ZOSYN)  IV 12.5 mL/hr at 11/03/19 1500   vancomycin 750 mg (11/03/19 1846)     LOS: 1 day    Time spent:35 mins    Kathie Dike, MD [facility]   If 7PM-7AM, please contact night-coverage [email or web address]  11/03/2019, 8:13 PM

## 2019-11-04 ENCOUNTER — Encounter (HOSPITAL_COMMUNITY)
Admission: EM | Disposition: A | Discharge status: Skilled Nursing Facility | Payer: Self-pay | Source: Home / Self Care | Attending: Internal Medicine

## 2019-11-04 ENCOUNTER — Inpatient Hospital Stay (HOSPITAL_COMMUNITY): Payer: Medicare HMO | Admitting: Anesthesiology

## 2019-11-04 HISTORY — PX: AMPUTATION: SHX166

## 2019-11-04 LAB — BASIC METABOLIC PANEL
Anion gap: 11 (ref 5–15)
BUN: 17 mg/dL (ref 8–23)
CO2: 28 mmol/L (ref 22–32)
Calcium: 8.7 mg/dL — ABNORMAL LOW (ref 8.9–10.3)
Chloride: 100 mmol/L (ref 98–111)
Creatinine, Ser: 0.79 mg/dL (ref 0.61–1.24)
GFR calc Af Amer: >60 mL/min (ref >60)
GFR calc non Af Amer: >60 mL/min (ref >60)
Glucose, Bld: 214 mg/dL — ABNORMAL HIGH (ref 70–99)
Potassium: 3.6 mmol/L (ref 3.5–5.1)
Sodium: 139 mmol/L (ref 135–145)

## 2019-11-04 LAB — GLUCOSE, CAPILLARY
Glucose-Capillary: 179 mg/dL — ABNORMAL HIGH (ref 70–99)
Glucose-Capillary: 155 mg/dL — ABNORMAL HIGH (ref 70–99)
Glucose-Capillary: 150 mg/dL — ABNORMAL HIGH (ref 70–99)
Glucose-Capillary: 159 mg/dL — ABNORMAL HIGH (ref 70–99)
Glucose-Capillary: 153 mg/dL — ABNORMAL HIGH (ref 70–99)
Glucose-Capillary: 214 mg/dL — ABNORMAL HIGH (ref 70–99)

## 2019-11-04 LAB — CBC
HCT: 33.5 % — ABNORMAL LOW (ref 39.0–52.0)
HCT: 32 % — ABNORMAL LOW (ref 39.0–52.0)
Hemoglobin: 10.7 g/dL — ABNORMAL LOW (ref 13.0–17.0)
Hemoglobin: 9.8 g/dL — ABNORMAL LOW (ref 13.0–17.0)
MCH: 29.7 pg (ref 26.0–34.0)
MCH: 29.3 pg (ref 26.0–34.0)
MCHC: 31.9 g/dL (ref 30.0–36.0)
MCHC: 30.6 g/dL (ref 30.0–36.0)
MCV: 93.1 fL (ref 80.0–100.0)
MCV: 95.5 fL (ref 80.0–100.0)
Platelets: 447 10*3/uL — ABNORMAL HIGH (ref 150–400)
Platelets: 416 10*3/uL — ABNORMAL HIGH (ref 150–400)
RBC: 3.6 MIL/uL — ABNORMAL LOW (ref 4.22–5.81)
RBC: 3.35 MIL/uL — ABNORMAL LOW (ref 4.22–5.81)
RDW: 13.7 % (ref 11.5–15.5)
RDW: 13.7 % (ref 11.5–15.5)
WBC: 15.5 10*3/uL — ABNORMAL HIGH (ref 4.0–10.5)
WBC: 14.8 10*3/uL — ABNORMAL HIGH (ref 4.0–10.5)
nRBC: 0 % (ref 0.0–0.2)
nRBC: 0 % (ref 0.0–0.2)

## 2019-11-04 SURGERY — AMPUTATION BELOW KNEE
Anesthesia: General | Site: Knee | Laterality: Left

## 2019-11-04 MED ORDER — MIDAZOLAM HCL 2 MG/2ML IJ SOLN
2.0000 mg | Freq: Once | INTRAMUSCULAR | Status: AC
  Administered 2019-11-04: 2 mg via INTRAVENOUS

## 2019-11-04 MED ORDER — FENTANYL CITRATE (PF) 100 MCG/2ML IJ SOLN
INTRAMUSCULAR | Status: AC
Start: 2019-11-04 — End: ?
  Filled 2019-11-04: qty 2

## 2019-11-04 MED ORDER — POVIDONE-IODINE 10 % EX OINT
TOPICAL_OINTMENT | CUTANEOUS | Status: AC
  Filled 2019-11-04: qty 2

## 2019-11-04 MED ORDER — CHLORHEXIDINE GLUCONATE 0.12 % MT SOLN
15.0000 mL | Freq: Once | OROMUCOSAL | Status: AC
  Administered 2019-11-04: 15 mL via OROMUCOSAL

## 2019-11-04 MED ORDER — HYDROMORPHONE HCL 1 MG/ML IJ SOLN: 0.2500 mg | INTRAMUSCULAR | Status: DC | PRN

## 2019-11-04 MED ORDER — SODIUM CHLORIDE 0.9 % IR SOLN
Status: DC | PRN
  Administered 2019-11-04: 1000 mL

## 2019-11-04 MED ORDER — CHLORHEXIDINE GLUCONATE CLOTH 2 % EX PADS
6.0000 | MEDICATED_PAD | Freq: Once | CUTANEOUS | Status: AC
  Administered 2019-11-04: 6 via TOPICAL

## 2019-11-04 MED ORDER — LACTATED RINGERS IV SOLN: INTRAVENOUS | Status: DC | PRN

## 2019-11-04 MED ORDER — PROPOFOL 10 MG/ML IV BOLUS
INTRAVENOUS | Status: AC
  Filled 2019-11-04: qty 20

## 2019-11-04 MED ORDER — LACTATED RINGERS IV SOLN
Freq: Once | INTRAVENOUS | Status: AC
  Administered 2019-11-04: 1000 mL via INTRAVENOUS

## 2019-11-04 MED ORDER — LIDOCAINE HCL (PF) 1 % IJ SOLN
INTRAMUSCULAR | Status: DC | PRN
  Administered 2019-11-04: 3 mL

## 2019-11-04 MED ORDER — BUPIVACAINE HCL (PF) 0.5 % IJ SOLN
INTRAMUSCULAR | Status: DC | PRN
  Administered 2019-11-04: 25 mL

## 2019-11-04 MED ORDER — ONDANSETRON HCL 4 MG/2ML IJ SOLN: 4.0000 mg | Freq: Once | INTRAMUSCULAR | Status: DC

## 2019-11-04 MED ORDER — HYDROMORPHONE HCL 1 MG/ML IJ SOLN
1.0000 mg | INTRAMUSCULAR | Status: DC | PRN
  Administered 2019-11-05 (×2): 1 mg via INTRAVENOUS
  Filled 2019-11-04 (×2): qty 1

## 2019-11-04 MED ORDER — PROPOFOL 500 MG/50ML IV EMUL
INTRAVENOUS | Status: DC | PRN
  Administered 2019-11-04: 8300 ug via INTRAVENOUS
  Administered 2019-11-04: 75 ug/kg/min via INTRAVENOUS
  Administered 2019-11-04: 100 ug/kg/min via INTRAVENOUS

## 2019-11-04 MED ORDER — LIDOCAINE HCL (PF) 1 % IJ SOLN
INTRAMUSCULAR | Status: AC
  Filled 2019-11-04: qty 30

## 2019-11-04 MED ORDER — MIDAZOLAM HCL 2 MG/2ML IJ SOLN
INTRAMUSCULAR | Status: AC
  Filled 2019-11-04: qty 2

## 2019-11-04 MED ORDER — BUPIVACAINE HCL 0.25 % IJ SOLN
INTRAMUSCULAR | Status: DC | PRN
  Administered 2019-11-04: 17 mL

## 2019-11-04 MED ORDER — FENTANYL CITRATE (PF) 100 MCG/2ML IJ SOLN
100.0000 ug | Freq: Once | INTRAMUSCULAR | Status: AC
  Administered 2019-11-04: 100 ug via INTRAVENOUS

## 2019-11-04 MED ORDER — MEPERIDINE HCL 50 MG/ML IJ SOLN: 6.2500 mg | INTRAMUSCULAR | Status: DC | PRN

## 2019-11-04 MED ORDER — BUPIVACAINE HCL (PF) 0.25 % IJ SOLN
INTRAMUSCULAR | Status: AC
  Filled 2019-11-04: qty 30

## 2019-11-04 MED ORDER — ORAL CARE MOUTH RINSE: 15.0000 mL | Freq: Once | OROMUCOSAL | Status: AC

## 2019-11-04 MED ORDER — OXYCODONE-ACETAMINOPHEN 5-325 MG PO TABS
1.0000 | ORAL_TABLET | ORAL | Status: DC | PRN
  Administered 2019-11-05 – 2019-11-09 (×7): 1 via ORAL
  Filled 2019-11-04 (×7): qty 1

## 2019-11-04 MED ORDER — SODIUM CHLORIDE 0.9 % IV SOLN: INTRAVENOUS | Status: DC

## 2019-11-04 MED ORDER — BUPIVACAINE HCL (PF) 0.5 % IJ SOLN
INTRAMUSCULAR | Status: AC
  Filled 2019-11-04: qty 30

## 2019-11-04 MED ORDER — FENTANYL CITRATE (PF) 100 MCG/2ML IJ SOLN
INTRAMUSCULAR | Status: AC
  Filled 2019-11-04: qty 2

## 2019-11-04 MED ORDER — POVIDONE-IODINE 10 % OINT PACKET
TOPICAL_OINTMENT | CUTANEOUS | Status: DC | PRN
  Administered 2019-11-04: 1 via TOPICAL

## 2019-11-04 MED ORDER — KETOROLAC TROMETHAMINE 30 MG/ML IJ SOLN
15.0000 mg | Freq: Once | INTRAMUSCULAR | Status: AC
  Administered 2019-11-04: 15 mg via INTRAVENOUS
  Filled 2019-11-04: qty 1

## 2019-11-04 SURGICAL SUPPLY — 39 items
BLADE SAW RECIP 87.9 MT (BLADE) ×3 IMPLANT
BLADE SURG SZ10 CARB STEEL (BLADE) ×3 IMPLANT
BNDG COHESIVE 4X5 TAN STRL (GAUZE/BANDAGES/DRESSINGS) ×3 IMPLANT
BNDG ELASTIC 6X5.8 VLCR NS LF (GAUZE/BANDAGES/DRESSINGS) ×6 IMPLANT
BNDG GAUZE ELAST 4 BULKY (GAUZE/BANDAGES/DRESSINGS) ×6 IMPLANT
CLOTH BEACON ORANGE TIMEOUT ST (SAFETY) ×3 IMPLANT
COVER LIGHT HANDLE STERIS (MISCELLANEOUS) ×6 IMPLANT
COVER WAND RF STERILE (DRAPES) ×3 IMPLANT
ELECT REM PT RETURN 9FT ADLT (ELECTROSURGICAL) ×3
ELECTRODE REM PT RTRN 9FT ADLT (ELECTROSURGICAL) ×1 IMPLANT
GAUZE SPONGE 4X4 12PLY STRL (GAUZE/BANDAGES/DRESSINGS) ×6 IMPLANT
GLOVE BIO SURGEON STRL SZ7 (GLOVE) ×2 IMPLANT
GLOVE BIOGEL PI IND STRL 7.0 (GLOVE) ×2 IMPLANT
GLOVE BIOGEL PI IND STRL 7.5 (GLOVE) IMPLANT
GLOVE BIOGEL PI INDICATOR 7.0 (GLOVE) ×4
GLOVE BIOGEL PI INDICATOR 7.5 (GLOVE) ×2
GLOVE SURG SS PI 7.0 STRL IVOR (GLOVE) ×2 IMPLANT
GLOVE SURG SS PI 7.5 STRL IVOR (GLOVE) ×3 IMPLANT
GOWN STRL REUS W/TWL LRG LVL3 (GOWN DISPOSABLE) ×9 IMPLANT
INST SET MAJOR BONE (KITS) ×3 IMPLANT
KIT TURNOVER KIT A (KITS) ×3 IMPLANT
MANIFOLD NEPTUNE II (INSTRUMENTS) ×3 IMPLANT
NS IRRIG 1000ML POUR BTL (IV SOLUTION) ×3 IMPLANT
PACK BASIC LIMB (CUSTOM PROCEDURE TRAY) ×3 IMPLANT
PAD ABD 5X9 TENDERSORB (GAUZE/BANDAGES/DRESSINGS) ×6 IMPLANT
PAD ARMBOARD 7.5X6 YLW CONV (MISCELLANEOUS) ×3 IMPLANT
PENCIL SMOKE EVACUATOR (MISCELLANEOUS) ×3 IMPLANT
SET BASIN LINEN APH (SET/KITS/TRAYS/PACK) ×3 IMPLANT
SOL PREP PROV IODINE SCRUB 4OZ (MISCELLANEOUS) ×3 IMPLANT
SPONGE LAP 18X18 RF (DISPOSABLE) ×9 IMPLANT
STAPLER VISISTAT 35W (STAPLE) ×3 IMPLANT
SUT BONE WAX W31G (SUTURE) ×2 IMPLANT
SUT SILK 0 FSL (SUTURE) ×27 IMPLANT
SUT SILK 2 0 (SUTURE)
SUT SILK 2 0 SH (SUTURE) IMPLANT
SUT SILK 2-0 18XBRD TIE 12 (SUTURE) ×1 IMPLANT
SUT VIC AB 2-0 CT1 27 (SUTURE) ×12
SUT VIC AB 2-0 CT1 TAPERPNT 27 (SUTURE) ×4 IMPLANT
TRAP FLUID SMOKE EVACUATOR (MISCELLANEOUS) ×2 IMPLANT

## 2019-11-04 NOTE — Anesthesia Procedure Notes (Addendum)
Anesthesia Regional Block: Popliteal block   Pre-Anesthetic Checklist: ,, timeout performed, Correct Patient, Correct Site, Correct Laterality, Correct Procedure, Correct Position, site marked, Risks and benefits discussed, pre-op evaluation,  At surgeon's request and post-op pain management  Laterality: Left  Prep: chloraprep       Needles:  Injection technique: Single-shot  Needle Type: Echogenic Stimulator Needle     Needle Length: 10cm  Needle Gauge: 20   Needle insertion depth: 6 cm   Additional Needles:   Narrative:  Start time: 11/04/2019 12:57 PM End time: 11/04/2019 1:04 PM Injection made incrementally with aspirations every 5 mL.  Performed by: Personally  Anesthesiologist: Denese Killings, MD  Additional Notes:  BP cuff, EKG monitors applied. Sedation begun. After nerve location 24 ml of bupivacaine(0.5%)  injected incrementally, slowly , and after neg aspirations.  Tolerated well.

## 2019-11-04 NOTE — H&P (View-Only) (Signed)
Subjective: Continues to have left foot pain.  Objective: Vital signs in last 24 hours: Temp:  [98.1 F (36.7 C)-99.4 F (37.4 C)] 99.4 F (37.4 C) (08/18 2113) Pulse Rate:  [68-73] 73 (08/18 2113) Resp:  [18] 18 (08/18 1439) BP: (143-163)/(78-82) 163/82 (08/18 2113) SpO2:  [94 %-99 %] 94 % (08/18 2113) Last BM Date: 11/02/19  Intake/Output from previous day: 08/18 0701 - 08/19 0700 In: 585.9 [I.V.:295.1; IV Piggyback:290.7] Out: 2650 [Urine:2650] Intake/Output this shift: No intake/output data recorded.  General appearance: alert, cooperative and no distress Extremities: Continues to have gangrenous changes in the left foot with cellulitis extending up to the ankle.  Lab Results:  Recent Labs    11/03/19 0405 11/04/19 0605  WBC 17.7* 15.5*  HGB 11.2* 10.7*  HCT 36.0* 33.5*  PLT 465* 447*   BMET Recent Labs    11/03/19 0405 11/04/19 0605  NA 138 139  K 3.6 3.6  CL 100 100  CO2 27 28  GLUCOSE 114* 214*  BUN 23 17  CREATININE 1.03 0.79  CALCIUM 8.7* 8.7*   PT/INR Recent Labs    11/02/19 1715 11/03/19 0405  LABPROT 14.6 15.1  INR 1.2 1.2    Studies/Results: DG Chest 2 View  Result Date: 11/02/2019 CLINICAL DATA:  [place], left foot pain EXAM: CHEST - 2 VIEW COMPARISON:  09/01/2019 FINDINGS: The heart size and mediastinal contours are within normal limits. Both lungs are clear. The visualized skeletal structures are unremarkable. IMPRESSION: No active cardiopulmonary disease. Electronically Signed   By: Randa Ngo M.D.   On: 11/02/2019 18:41   US ARTERIAL SEG MULTIPLE LE (ABI, SEGMENTAL PRESSURES, PVR'S)  Result Date: 11/03/2019 CLINICAL DATA:  Left foot wound.  Black left foot. EXAM: NONINVASIVE PHYSIOLOGIC VASCULAR STUDY OF BILATERAL LOWER EXTREMITIES TECHNIQUE: Non-invasive vascular evaluation of both lower extremities was performed at rest, including calculation of ankle-brachial indices, multiple segmental pressure evaluation, segmental Doppler  and segmental pulse volume recording. COMPARISON:  None. FINDINGS: Right Lower Extremity Resting ABI:  1.10 Segmental Pressures: Elevated pressures in the calf likely related to incompressible vessels. Great toe pressure: 88 Arterial Waveforms: Biphasic and triphasic waveforms in the right upper thigh. Biphasic waveforms in the right popliteal artery. Monophasic waveforms in the right ankle. PVRs: Normal PVRs with maintained waveform amplitude. Left Lower Extremity: Resting ABI: 0.64 Segmental Pressures: Significant pressure drop between the lower thigh and the calf. Great toe pressure: Could not be obtained Arterial Waveforms: Biphasic waveform in the left upper thigh. Monophasic waveform in the left mid thigh and left popliteal artery. No significant waveforms at the left ankle. Markedly decreased left digital waveforms compared to the right. PVRs: PVR waveforms are symmetric to the right lower extremity. Other: Symmetric upper extremity pressures. Ankle Brachial index > 1.4 Non diagnostic secondary to incompressible vessel calcifications 1.0-1.4       Normal 0.9-0.99     Borderline PAD 0.8-0.89     Mild PAD 0.5-0.79     Moderate PAD < 0.5          Severe PAD Toe Brachial Index Normal     >0.65 Moderate  0.53-0.64 Severe     <0.23 Toe Pressures Absolute toe pressure >62mmHg sufficient for wound healing. Toe pressures <91mmHg = critical limb ischemia. IMPRESSION: 1. Right ankle-brachial index is normal, measuring 1.10. 2. Left ankle-brachial index is 0.64 and suggestive for moderate peripheral arterial disease. Significant pressure gradient between the left lower thigh and the left calf is suggestive for occlusive disease in this area. No  significant flow identified in the left digital arteries. Electronically Signed   By: Markus Daft M.D.   On: 11/03/2019 17:43   DG Foot Complete Left  Result Date: 11/02/2019 CLINICAL DATA:  Pulled a piece of glass out of his left foot 1 week ago with subsequent redness and  bruising. EXAM: LEFT FOOT - COMPLETE 3+ VIEW COMPARISON:  None. FINDINGS: There is no evidence of fracture or dislocation. There is no evidence of arthropathy or other focal bone abnormality. Mild focal soft tissue swelling is seen along the dorsal aspect of the distal left foot. No radiopaque soft tissue foreign bodies are identified. IMPRESSION: Mild focal dorsal soft tissue swelling with no radiopaque soft tissue foreign bodies identified. Electronically Signed   By: Virgina Norfolk M.D.   On: 11/02/2019 16:17    Anti-infectives: Anti-infectives (From admission, onward)   Start     Dose/Rate Route Frequency Ordered Stop   11/03/19 0700  vancomycin (VANCOREADY) IVPB 750 mg/150 mL        750 mg 150 mL/hr over 60 Minutes Intravenous Every 12 hours 11/02/19 1843     11/03/19 0600  piperacillin-tazobactam (ZOSYN) IVPB 3.375 g        3.375 g 12.5 mL/hr over 240 Minutes Intravenous Every 8 hours 11/02/19 2058     11/02/19 2100  piperacillin-tazobactam (ZOSYN) IVPB 3.375 g        3.375 g 100 mL/hr over 30 Minutes Intravenous  Once 11/02/19 2058 11/02/19 2210   11/02/19 1715  vancomycin (VANCOCIN) IVPB 1000 mg/200 mL premix        1,000 mg 200 mL/hr over 60 Minutes Intravenous Every 1 hr x 2 11/02/19 1710 11/02/19 2059   11/02/19 1700  vancomycin (VANCOCIN) IVPB 1000 mg/200 mL premix  Status:  Discontinued        1,000 mg 200 mL/hr over 60 Minutes Intravenous  Once 11/02/19 1647 11/02/19 1710   11/02/19 1700  cefTRIAXone (ROCEPHIN) 2 g in sodium chloride 0.9 % 100 mL IVPB        2 g 200 mL/hr over 30 Minutes Intravenous  Once 11/02/19 1647 11/02/19 1804      Assessment/Plan: Impression: Gangrene of left foot, diabetes mellitus Plan: As patient is not a revascularization candidate, will proceed with a left below the knee amputation today.  The risks and benefits of the procedure including bleeding, infection, the possible need for blood transfusion, and the possibility of wound breakdown were  fully explained to the patient, who gave informed consent.  LOS: 2 days    Aviva Signs 11/04/2019

## 2019-11-04 NOTE — Anesthesia Preprocedure Evaluation (Addendum)
Anesthesia Evaluation  Patient identified by MRN, date of birth, ID band Patient awake    Reviewed: Allergy & Precautions, NPO status , Patient's Chart, lab work & pertinent test results  Airway Mallampati: II  TM Distance: >3 FB Neck ROM: Full    Dental  (+) Edentulous Upper, Missing, Dental Advisory Given   Pulmonary shortness of breath and with exertion, former smoker,    Pulmonary exam normal breath sounds clear to auscultation       Cardiovascular METS: 3 - Mets hypertension, Pt. on medications + angina + CAD  Normal cardiovascular exam+ Valvular Problems/Murmurs AS  Rhythm:Regular Rate:Normal  03-Nov-2019 08:59:07 [facility] ROUTINE RECORD Normal sinus rhythm Left ventricular hypertrophy Confirmed by Asencion Noble [phone]) on 11/03/2019 7:48:54 PM   Neuro/Psych    GI/Hepatic   Endo/Other  diabetes, Well Controlled, Type 2, Insulin Dependent  Renal/GU Renal disease     Musculoskeletal  (+) Arthritis ,   Abdominal   Peds  Hematology   Anesthesia Other Findings Left ventricle: The cavity size was normal. Systolic function was  normal. The estimated ejection fraction was in the range of 55%  to 60%. Wall motion was normal; there were no regional wall  motion abnormalities. Mild to moderate concentric LVH. Diastolic  dysfunction, grade indeterminate. Indeterminate filling  pressures.  - Aortic valve: Trileaflet; mildly thickened, mildly calcified  leaflets.  - Aorta: Mild aortic root and ascending aortic dilatation. Aortic  root dimension: 42 mm (ED). Ascending aortic diameter: 43 mm (S).  - Right ventricle: Systolic function was moderately reduced.   Reproductive/Obstetrics                            Anesthesia Physical Anesthesia Plan  ASA: IV  Anesthesia Plan: General and Regional   Post-op Pain Management:  Regional for Post-op pain   Induction:    PONV Risk Score and Plan: TIVA and Ondansetron  Airway Management Planned: Nasal Cannula, Natural Airway and Simple Face Mask  Additional Equipment:   Intra-op Plan:   Post-operative Plan:   Informed Consent: I have reviewed the patients History and Physical, chart, labs and discussed the procedure including the risks, benefits and alternatives for the proposed anesthesia with the patient or authorized representative who has indicated his/her understanding and acceptance.     Dental advisory given  Plan Discussed with: CRNA and Surgeon  Anesthesia Plan Comments: (Possible GA with ETT)       Anesthesia Quick Evaluation

## 2019-11-04 NOTE — Transfer of Care (Signed)
Immediate Anesthesia Transfer of Care Note  Patient: [name]  Procedure(s) Performed: LEFT BELOW KNEE AMPUTATION (Left Knee)  Patient Location: PACU  Anesthesia Type:MAC and Regional  Level of Consciousness: awake, alert , oriented and patient cooperative  Airway & Oxygen Therapy: Patient Spontanous Breathing and Patient connected to nasal cannula oxygen  Post-op Assessment: Report given to RN, Post -op Vital signs reviewed and stable and Patient moving all extremities  Post vital signs: Reviewed and stable  Last Vitals:  Vitals Value Taken Time  BP    Temp    Pulse    Resp    SpO2      Last Pain:  Vitals:   11/04/19 1229  TempSrc: Oral  PainSc: 0-No pain      Patients Stated Pain Goal: 9 (73/71/06 2694)  Complications: No complications documented.

## 2019-11-04 NOTE — Op Note (Signed)
Patient:  Alfred Miller  DOB:  [birth year]  MRN:  [id number]   Preop Diagnosis: Gangrene, left foot, diabetes mellitus, peripheral vascular disease  Postop Diagnosis: Same  Procedure: Left below the knee amputation  Surgeon: Aviva Signs, MD  Anes: Regional/MAC  Indications: Patient is a 69 year old white male who presents with gangrene of the left foot.  He does have diabetes mellitus and suffered trauma to the left foot.  After discussion with vascular surgery, the patient was not felt to need revascularization.  He now presents for a left below the knee amputation.  The risks and benefits of the procedure including bleeding, infection, cardiopulmonary difficulties, and the possible need for an above-the-knee amputation in the future should this wound not heal were fully explained to the patient, who gave informed consent.  Procedure note: The patient was placed in supine position after regional and monitored anesthesia care was given.  The left leg was prepped and draped using the usual sterile technique with Betadine.  Surgical site confirmation was performed.  An anterior incision was made over the tibia about a handsbreadth below the tibial tuberosity.  The soft tissue on either side of the tibia was taken down to the fibula.  Any major vessels were suture-ligated with 0 silk suture ligatures.  The posterior flap was then formed using an amputation knife just below the fibula.  The tibia and fibula were divided using a saw.  A rasp was used to smooth the edges.  Bone wax was placed on the distal ends.  The left lower extremity was then removed from the operative field.  The wound was irrigated with normal saline.  The posterior flap had to be trimmed and various muscle layers excised in order to bring the flap up anteriorly.  The fascial edges from the anterior and posterior portions of the leg were reapproximated using 2-0 Vicryl interrupted sutures.  The skin edges were trimmed in order to  get rid of any excess soft tissue and skin.  The skin edges were then closed using staples.  Betadine ointment and dry sterile dressing were applied.  All tape and needle counts were correct at the end of the procedure.  The patient was transferred to PACU in stable condition.  Complications: None  EBL: 300 cc  Specimen: Left lower extremity

## 2019-11-04 NOTE — Anesthesia Postprocedure Evaluation (Signed)
Anesthesia Post Note  Patient: Alfred Miller  Procedure(s) Performed: LEFT BELOW KNEE AMPUTATION (Left Knee)  Patient location during evaluation: PACU Anesthesia Type: Regional and MAC Level of consciousness: awake, oriented, awake and alert and patient cooperative Pain management: pain level controlled Vital Signs Assessment: post-procedure vital signs reviewed and stable Respiratory status: spontaneous breathing, nonlabored ventilation, patient connected to nasal cannula oxygen and respiratory function stable Cardiovascular status: blood pressure returned to baseline and stable Postop Assessment: no headache and no backache Anesthetic complications: no   No complications documented.   Last Vitals:  Vitals:   11/04/19 1255 11/04/19 1300  BP: (!) 178/91 (!) 167/90  Pulse:    Resp: 18 18  Temp:    SpO2: 99% 99%    Last Pain:  Vitals:   11/04/19 1229  TempSrc: Oral  PainSc: 0-No pain                 Tacy Learn

## 2019-11-04 NOTE — Interval H&P Note (Signed)
History and Physical Interval Note:  11/04/2019 12:29 PM  Alfred Miller  has presented today for surgery, with the diagnosis of gangrene left foot.  The various methods of treatment have been discussed with the patient and family. After consideration of risks, benefits and other options for treatment, the patient has consented to  Procedure(s): LEFT BELOW KNEE AMPUTATION (Left) as a surgical intervention.  The patient's history has been reviewed, patient examined, no change in status, stable for surgery.  I have reviewed the patient's chart and labs.  Questions were answered to the patient's satisfaction.     Aviva Signs

## 2019-11-04 NOTE — Progress Notes (Signed)
Subjective: Continues to have left foot pain.  Objective: Vital signs in last 24 hours: Temp:  [98.1 F (36.7 C)-99.4 F (37.4 C)] 99.4 F (37.4 C) (08/18 2113) Pulse Rate:  [68-73] 73 (08/18 2113) Resp:  [18] 18 (08/18 1439) BP: (143-163)/(78-82) 163/82 (08/18 2113) SpO2:  [94 %-99 %] 94 % (08/18 2113) Last BM Date: 11/02/19  Intake/Output from previous day: 08/18 0701 - 08/19 0700 In: 585.9 [I.V.:295.1; IV Piggyback:290.7] Out: 2650 [Urine:2650] Intake/Output this shift: No intake/output data recorded.  General appearance: alert, cooperative and no distress Extremities: Continues to have gangrenous changes in the left foot with cellulitis extending up to the ankle.  Lab Results:  Recent Labs    11/03/19 0405 11/04/19 0605  WBC 17.7* 15.5*  HGB 11.2* 10.7*  HCT 36.0* 33.5*  PLT 465* 447*   BMET Recent Labs    11/03/19 0405 11/04/19 0605  NA 138 139  K 3.6 3.6  CL 100 100  CO2 27 28  GLUCOSE 114* 214*  BUN 23 17  CREATININE 1.03 0.79  CALCIUM 8.7* 8.7*   PT/INR Recent Labs    11/02/19 1715 11/03/19 0405  LABPROT 14.6 15.1  INR 1.2 1.2    Studies/Results: DG Chest 2 View  Result Date: 11/02/2019 CLINICAL DATA:  [place], left foot pain EXAM: CHEST - 2 VIEW COMPARISON:  09/01/2019 FINDINGS: The heart size and mediastinal contours are within normal limits. Both lungs are clear. The visualized skeletal structures are unremarkable. IMPRESSION: No active cardiopulmonary disease. Electronically Signed   By: Randa Ngo M.D.   On: 11/02/2019 18:41   US ARTERIAL SEG MULTIPLE LE (ABI, SEGMENTAL PRESSURES, PVR'S)  Result Date: 11/03/2019 CLINICAL DATA:  Left foot wound.  Black left foot. EXAM: NONINVASIVE PHYSIOLOGIC VASCULAR STUDY OF BILATERAL LOWER EXTREMITIES TECHNIQUE: Non-invasive vascular evaluation of both lower extremities was performed at rest, including calculation of ankle-brachial indices, multiple segmental pressure evaluation, segmental Doppler  and segmental pulse volume recording. COMPARISON:  None. FINDINGS: Right Lower Extremity Resting ABI:  1.10 Segmental Pressures: Elevated pressures in the calf likely related to incompressible vessels. Great toe pressure: 88 Arterial Waveforms: Biphasic and triphasic waveforms in the right upper thigh. Biphasic waveforms in the right popliteal artery. Monophasic waveforms in the right ankle. PVRs: Normal PVRs with maintained waveform amplitude. Left Lower Extremity: Resting ABI: 0.64 Segmental Pressures: Significant pressure drop between the lower thigh and the calf. Great toe pressure: Could not be obtained Arterial Waveforms: Biphasic waveform in the left upper thigh. Monophasic waveform in the left mid thigh and left popliteal artery. No significant waveforms at the left ankle. Markedly decreased left digital waveforms compared to the right. PVRs: PVR waveforms are symmetric to the right lower extremity. Other: Symmetric upper extremity pressures. Ankle Brachial index > 1.4 Non diagnostic secondary to incompressible vessel calcifications 1.0-1.4       Normal 0.9-0.99     Borderline PAD 0.8-0.89     Mild PAD 0.5-0.79     Moderate PAD < 0.5          Severe PAD Toe Brachial Index Normal     >0.65 Moderate  0.53-0.64 Severe     <0.23 Toe Pressures Absolute toe pressure >40mmHg sufficient for wound healing. Toe pressures <80mmHg = critical limb ischemia. IMPRESSION: 1. Right ankle-brachial index is normal, measuring 1.10. 2. Left ankle-brachial index is 0.64 and suggestive for moderate peripheral arterial disease. Significant pressure gradient between the left lower thigh and the left calf is suggestive for occlusive disease in this area. No  significant flow identified in the left digital arteries. Electronically Signed   By: Markus Daft M.D.   On: 11/03/2019 17:43   DG Foot Complete Left  Result Date: 11/02/2019 CLINICAL DATA:  Pulled a piece of glass out of his left foot 1 week ago with subsequent redness and  bruising. EXAM: LEFT FOOT - COMPLETE 3+ VIEW COMPARISON:  None. FINDINGS: There is no evidence of fracture or dislocation. There is no evidence of arthropathy or other focal bone abnormality. Mild focal soft tissue swelling is seen along the dorsal aspect of the distal left foot. No radiopaque soft tissue foreign bodies are identified. IMPRESSION: Mild focal dorsal soft tissue swelling with no radiopaque soft tissue foreign bodies identified. Electronically Signed   By: Virgina Norfolk M.D.   On: 11/02/2019 16:17    Anti-infectives: Anti-infectives (From admission, onward)   Start     Dose/Rate Route Frequency Ordered Stop   11/03/19 0700  vancomycin (VANCOREADY) IVPB 750 mg/150 mL        750 mg 150 mL/hr over 60 Minutes Intravenous Every 12 hours 11/02/19 1843     11/03/19 0600  piperacillin-tazobactam (ZOSYN) IVPB 3.375 g        3.375 g 12.5 mL/hr over 240 Minutes Intravenous Every 8 hours 11/02/19 2058     11/02/19 2100  piperacillin-tazobactam (ZOSYN) IVPB 3.375 g        3.375 g 100 mL/hr over 30 Minutes Intravenous  Once 11/02/19 2058 11/02/19 2210   11/02/19 1715  vancomycin (VANCOCIN) IVPB 1000 mg/200 mL premix        1,000 mg 200 mL/hr over 60 Minutes Intravenous Every 1 hr x 2 11/02/19 1710 11/02/19 2059   11/02/19 1700  vancomycin (VANCOCIN) IVPB 1000 mg/200 mL premix  Status:  Discontinued        1,000 mg 200 mL/hr over 60 Minutes Intravenous  Once 11/02/19 1647 11/02/19 1710   11/02/19 1700  cefTRIAXone (ROCEPHIN) 2 g in sodium chloride 0.9 % 100 mL IVPB        2 g 200 mL/hr over 30 Minutes Intravenous  Once 11/02/19 1647 11/02/19 1804      Assessment/Plan: Impression: Gangrene of left foot, diabetes mellitus Plan: As patient is not a revascularization candidate, will proceed with a left below the knee amputation today.  The risks and benefits of the procedure including bleeding, infection, the possible need for blood transfusion, and the possibility of wound breakdown were  fully explained to the patient, who gave informed consent.  LOS: 2 days    Aviva Signs 11/04/2019

## 2019-11-04 NOTE — Anesthesia Procedure Notes (Addendum)
Anesthesia Regional Block: Adductor canal block   Pre-Anesthetic Checklist: ,, timeout performed, Correct Patient, Correct Site, Correct Laterality, Correct Procedure, Correct Position, site marked, Risks and benefits discussed, at surgeon's request and post-op pain management  Laterality: Left  Prep: chloraprep       Needles:  Injection technique: Single-shot  Needle Type: Echogenic Stimulator Needle     Needle Length: 10cm  Needle Gauge: 20   Needle insertion depth: 6 cm   Additional Needles:   Procedures:,,,, ultrasound used (permanent image in chart),,,,  Narrative:  Start time: 11/04/2019 1:05 PM End time: 11/04/2019 1:10 PM Injection made incrementally with aspirations every 5 mL.  Performed by: Personally  Anesthesiologist: Denese Killings, MD  Additional Notes: BP cuff, EKG monitors applied. Sedation begun. location of nerve. After nerve location 20 ml of 0.25% of bupivacaine injected incrementally, slowly , and after neg aspirations. Tolerated well.

## 2019-11-04 NOTE — Progress Notes (Addendum)
PROGRESS NOTE    Alfred Miller  QHU:[id number] DOB: 08-Apr-1950 DOA: 11/02/2019 PCP: Glenda Chroman, MD    Brief Narrative:  HPI: Alfred Miller is a 69 y.o. male with medical history significant for type 2 diabetes mellitus, essential hypertension, CAD who presents to the emergency department due to worsening left foot wound.  Patient states that he accidentally stepped on a glass over a week ago and sustained a wound of left foot which has been worsening since onset.  He has since seen his PCP after sustaining the wound, but there was no antibiotics required at that time per patient.  Over the past 2 days, he noted discoloration to the left toes and has since developed subjective fever and chills, patient also noted a foul odor from the wound which prompted him to go to the ED for further evaluation.  He denies nausea, vomiting, headache, blurry vision, numbness or tingling.  ED Course:  In the emergency department, he was hemodynamically stable.  Work-up in the ED showed leukocytosis with a left shift.  BUN/creatinine 25/1.31 (no prior labs for comparison).  Albumin 3.3, AST 42, ALT 50.  Left foot x-ray showed mild focal dorsal soft tissue swelling with no radiopaque soft tissue foreign bodies identified. He was started on IV Vancomycin and ceftriaxone.   Assessment & Plan:   Principal Problem:   Wound, open, foot, left, initial encounter Active Problems:   Hypertension   Cellulitis and abscess of toe of left foot   Type 2 diabetes mellitus with foot ulcer (HCC)   Hyperlipidemia   AKI (acute kidney injury) ([place])   Prolonged QT interval   Obesity (BMI 30.0-34.9)   Gangrene of left foot ([place])   1. Left foot gangrene.  Seen by general surgery.  Currently on IV antibiotics.  Arterial Doppler studies done show ABI of 0.6 on the left side indicating peripheral vascular disease.  Case reviewed with Dr. Donnetta Hutching on-call for vascular surgery.  It was felt that if patient's foot is not salvageable and  he needs below the knee amputation, he would not be a candidate for revascularization at this time.  Discussed with Dr. Arnoldo Morale and patient underwent left below the knee potation on 8/19.  Further postoperative management per surgery. 2. Insulin-dependent diabetes, uncontrolled with hyperglycemia.  He is chronically on glipizide and Novolin N.  Will substitute with Lantus while in the hospital, start on sliding scale insulin.  Blood sugars have currently been stable 3. Hypertension.  Continued on Coreg and amlodipine.  Blood pressure currently stable. 4. Acute kidney injury.  Improved with IV fluids.  Secondary to #1. 5. Hyperlipidemia.  Continue statin   DVT prophylaxis: enoxaparin (LOVENOX) injection 40 mg Start: 11/03/19 2030 SCDs Start: 11/02/19 1928, lovenox  Code Status: full code Family Communication: left voicemail for wife Disposition Plan: Status is: Inpatient  Remains inpatient appropriate because:Inpatient level of care appropriate due to severity of illness   Dispo: The patient is from: Home              Anticipated d/c is to: Home              Anticipated d/c date is: 3 days              Patient currently is not medically stable to d/c.  Patient will need below the knee amputation for definitive treatment      Consultants:   General surgery  Procedures:     Antimicrobials:   Zosyn 8/17 >  Vancomycin  8/17 >   Subjective: Patient seen in his room postoperatively.  Reports that pain in his left leg is manageable at this time.  Objective: Vitals:   11/04/19 1500 11/04/19 1515 11/04/19 1530 11/04/19 1700  BP: (!) 147/83 (!) 155/81 (!) 164/83 (!) 146/88  Pulse: 62 63 65 67  Resp: (!) 22 (!) 22 (!) 21 20  Temp: 97.9 F (36.6 C)     TempSrc:      SpO2: 98% 99% 99% 100%  Weight:      Height:        Intake/Output Summary (Last 24 hours) at 11/04/2019 2047 Last data filed at 11/04/2019 1723 Gross per 24 hour  Intake 1946.03 ml  Output 1900 ml  Net 46.03  ml   Filed Weights   11/02/19 1518 11/02/19 2239  Weight: 113.4 kg 110.5 kg    Examination:  General exam: Alert, awake, oriented x 3 Respiratory system: Clear to auscultation. Respiratory effort normal. Cardiovascular system:RRR. No murmurs, rubs, gallops. Gastrointestinal system: Abdomen is nondistended, soft and nontender. No organomegaly or masses felt. Normal bowel sounds heard. Central nervous system: Alert and oriented. No focal neurological deficits. Extremities: left bka Skin: No rashes, lesions or ulcers Psychiatry: Judgement and insight appear normal. Mood & affect appropriate.      Data Reviewed: I have personally reviewed following labs and imaging studies  CBC: Recent Labs  Lab 11/02/19 1715 11/03/19 0405 11/04/19 0605  WBC 21.0* 17.7* 15.5*  NEUTROABS 16.1*  --   --   HGB 12.1* 11.2* 10.7*  HCT 37.3* 36.0* 33.5*  MCV 91.0 93.8 93.1  PLT 531* 465* 322*   Basic Metabolic Panel: Recent Labs  Lab 11/02/19 1715 11/03/19 0405 11/04/19 0605  NA 137 138 139  K 3.9 3.6 3.6  CL 96* 100 100  CO2 29 27 28   GLUCOSE 118* 114* 214*  BUN 25* 23 17  CREATININE 1.31* 1.03 0.79  CALCIUM 8.9 8.7* 8.7*  MG  --  2.3  --   PHOS  --  4.0  --    GFR: Estimated Creatinine Clearance: 115.3 mL/min (by C-G formula based on SCr of 0.79 mg/dL). Liver Function Tests: Recent Labs  Lab 11/02/19 1715 11/03/19 0405  AST 42* 30  ALT 50* 39  ALKPHOS 70 65  BILITOT 0.6 0.5  PROT 8.9* 7.7  ALBUMIN 3.3* 2.9*   No results for input(s): LIPASE, AMYLASE in the last 168 hours. No results for input(s): AMMONIA in the last 168 hours. Coagulation Profile: Recent Labs  Lab 11/02/19 1715 11/03/19 0405  INR 1.2 1.2   Cardiac Enzymes: No results for input(s): CKTOTAL, CKMB, CKMBINDEX, TROPONINI in the last 168 hours. BNP (last 3 results) No results for input(s): PROBNP in the last 8760 hours. HbA1C: Recent Labs    11/02/19 1901  HGBA1C 7.2*   CBG: Recent Labs  Lab  11/04/19 0752 11/04/19 1140 11/04/19 1230 11/04/19 1504 11/04/19 1702  GLUCAP 179* 155* 150* 159* 153*   Lipid Profile: No results for input(s): CHOL, HDL, LDLCALC, TRIG, CHOLHDL, LDLDIRECT in the last 72 hours. Thyroid Function Tests: No results for input(s): TSH, T4TOTAL, FREET4, T3FREE, THYROIDAB in the last 72 hours. Anemia Panel: No results for input(s): VITAMINB12, FOLATE, FERRITIN, TIBC, IRON, RETICCTPCT in the last 72 hours. Sepsis Labs: Recent Labs  Lab 11/02/19 1715 11/02/19 1901  LATICACIDVEN 1.2 1.4    Recent Results (from the past 240 hour(s))  SARS Coronavirus 2 by RT PCR (hospital order, performed in [facility] hospital lab)  Nasopharyngeal Nasopharyngeal Swab     Status: None   Collection Time: 11/02/19  4:48 PM   Specimen: Nasopharyngeal Swab  Result Value Ref Range Status   SARS Coronavirus 2 NEGATIVE NEGATIVE Final    Comment: (NOTE) SARS-CoV-2 target nucleic acids are NOT DETECTED.  The SARS-CoV-2 RNA is generally detectable in upper and lower respiratory specimens during the acute phase of infection. The lowest concentration of SARS-CoV-2 viral copies this assay can detect is 250 copies / mL. A negative result does not preclude SARS-CoV-2 infection and should not be used as the sole basis for treatment or other patient management decisions.  A negative result may occur with improper specimen collection / handling, submission of specimen other than nasopharyngeal swab, presence of viral mutation(s) within the areas targeted by this assay, and inadequate number of viral copies (<250 copies / mL). A negative result must be combined with clinical observations, patient history, and epidemiological information.  Fact Sheet for Patients:   StrictlyIdeas.no  Fact Sheet for Healthcare Providers: BankingDealers.co.za  This test is not yet approved or  cleared by the [place] FDA and has been authorized for  detection and/or diagnosis of SARS-CoV-2 by FDA under an Emergency Use Authorization (EUA).  This EUA will remain in effect (meaning this test can be used) for the duration of the COVID-19 declaration under Section 564(b)(1) of the Act, 21 U.S.C. section 360bbb-3(b)(1), unless the authorization is terminated or revoked sooner.  Performed at [facility], [address] [place], [place] [address]   Blood Culture (routine x 2)     Status: None (Preliminary result)   Collection Time: 11/02/19  5:05 PM   Specimen: BLOOD RIGHT HAND  Result Value Ref Range Status   Specimen Description   Final    BLOOD RIGHT HAND BOTTLES DRAWN AEROBIC AND ANAEROBIC   Special Requests   Final    Blood Culture results may not be optimal due to an inadequate volume of blood received in culture bottles   Culture   Final    NO GROWTH 2 DAYS Performed at [facility], [address], [place], [place] [address]    Report Status PENDING  Incomplete  Blood Culture (routine x 2)     Status: None (Preliminary result)   Collection Time: 11/02/19  5:15 PM   Specimen: Left Antecubital; Blood  Result Value Ref Range Status   Specimen Description   Final    LEFT ANTECUBITAL BOTTLES DRAWN AEROBIC AND ANAEROBIC   Special Requests Blood Culture adequate volume  Final   Culture   Final    NO GROWTH 2 DAYS Performed at [facility], [address], [place], [place] [address]    Report Status PENDING  Incomplete  Surgical PCR screen     Status: Abnormal   Collection Time: 11/02/19 11:00 PM   Specimen: Nasal Mucosa; Nasal Swab  Result Value Ref Range Status   MRSA, PCR NEGATIVE NEGATIVE Final   Staphylococcus aureus POSITIVE (A) NEGATIVE Final    Comment: RESULT CALLED TO, READ BACK BY AND VERIFIED WITH: BENGTSON,T AT 0745 ON 11/03/19 BY HUFFINES,S (NOTE) The Xpert SA Assay (FDA approved for NASAL specimens in patients 1 years of age and older), is one component of a comprehensive surveillance program. It is not  intended to diagnose infection nor to guide or monitor treatment. Performed at [facility], [address], [place], [place] [address]          Radiology Studies: US ARTERIAL SEG MULTIPLE LE (ABI, SEGMENTAL  PRESSURES, PVR'S)  Result Date: 11/03/2019 CLINICAL DATA:  Left foot wound.  Black left foot. EXAM: NONINVASIVE PHYSIOLOGIC VASCULAR STUDY OF BILATERAL LOWER EXTREMITIES TECHNIQUE: Non-invasive vascular evaluation of both lower extremities was performed at rest, including calculation of ankle-brachial indices, multiple segmental pressure evaluation, segmental Doppler and segmental pulse volume recording. COMPARISON:  None. FINDINGS: Right Lower Extremity Resting ABI:  1.10 Segmental Pressures: Elevated pressures in the calf likely related to incompressible vessels. Great toe pressure: 88 Arterial Waveforms: Biphasic and triphasic waveforms in the right upper thigh. Biphasic waveforms in the right popliteal artery. Monophasic waveforms in the right ankle. PVRs: Normal PVRs with maintained waveform amplitude. Left Lower Extremity: Resting ABI: 0.64 Segmental Pressures: Significant pressure drop between the lower thigh and the calf. Great toe pressure: Could not be obtained Arterial Waveforms: Biphasic waveform in the left upper thigh. Monophasic waveform in the left mid thigh and left popliteal artery. No significant waveforms at the left ankle. Markedly decreased left digital waveforms compared to the right. PVRs: PVR waveforms are symmetric to the right lower extremity. Other: Symmetric upper extremity pressures. Ankle Brachial index > 1.4 Non diagnostic secondary to incompressible vessel calcifications 1.0-1.4       Normal 0.9-0.99     Borderline PAD 0.8-0.89     Mild PAD 0.5-0.79     Moderate PAD < 0.5          Severe PAD Toe Brachial Index Normal     >0.65 Moderate  0.53-0.64 Severe     <0.23 Toe Pressures Absolute toe pressure >68mmHg sufficient for wound healing. Toe pressures <66mmHg = critical  limb ischemia. IMPRESSION: 1. Right ankle-brachial index is normal, measuring 1.10. 2. Left ankle-brachial index is 0.64 and suggestive for moderate peripheral arterial disease. Significant pressure gradient between the left lower thigh and the left calf is suggestive for occlusive disease in this area. No significant flow identified in the left digital arteries. Electronically Signed   By: Markus Daft M.D.   On: 11/03/2019 17:43        Scheduled Meds:  (feeding supplement) PROSource Plus  30 mL Oral BID BM   amLODipine  10 mg Oral Daily   atorvastatin  10 mg Oral Daily   carvedilol  12.5 mg Oral BID WC   Chlorhexidine Gluconate Cloth  6 each Topical Once   enoxaparin (LOVENOX) injection  40 mg Subcutaneous Q24H   feeding supplement (ENSURE ENLIVE)  237 mL Oral BID BM   insulin aspart  0-5 Units Subcutaneous QHS   insulin aspart  0-9 Units Subcutaneous TID WC   insulin glargine  30 Units Subcutaneous QHS   multivitamin-lutein  1 capsule Oral Daily   mupirocin ointment  1 application Nasal BID   Continuous Infusions:  sodium chloride 100 mL/hr at 11/04/19 1723   piperacillin-tazobactam (ZOSYN)  IV Stopped (11/04/19 1019)   vancomycin 750 mg (11/04/19 2028)     LOS: 2 days    Time spent:35 mins    Kathie Dike, MD [facility]   If 7PM-7AM, please contact night-coverage [email or web address]  11/04/2019, 8:47 PM

## 2019-11-04 NOTE — Addendum Note (Signed)
Addendum  created 11/04/19 1516 by Denese Killings, MD   Clinical Note Signed

## 2019-11-05 LAB — GLUCOSE, CAPILLARY
Glucose-Capillary: 150 mg/dL — ABNORMAL HIGH (ref 70–99)
Glucose-Capillary: 212 mg/dL — ABNORMAL HIGH (ref 70–99)
Glucose-Capillary: 156 mg/dL — ABNORMAL HIGH (ref 70–99)

## 2019-11-05 LAB — BASIC METABOLIC PANEL
Anion gap: 8 (ref 5–15)
BUN: 12 mg/dL (ref 8–23)
CO2: 29 mmol/L (ref 22–32)
Calcium: 8.2 mg/dL — ABNORMAL LOW (ref 8.9–10.3)
Chloride: 102 mmol/L (ref 98–111)
Creatinine, Ser: 0.73 mg/dL (ref 0.61–1.24)
GFR calc Af Amer: >60 mL/min (ref >60)
GFR calc non Af Amer: >60 mL/min (ref >60)
Glucose, Bld: 165 mg/dL — ABNORMAL HIGH (ref 70–99)
Potassium: 4.4 mmol/L (ref 3.5–5.1)
Sodium: 139 mmol/L (ref 135–145)

## 2019-11-05 LAB — TYPE AND SCREEN
ABO/RH(D): A NEG
Antibody Screen: NEGATIVE

## 2019-11-05 LAB — ABO/RH: ABO/RH(D): A NEG

## 2019-11-05 MED ORDER — INSULIN GLARGINE 100 UNIT/ML ~~LOC~~ SOLN
30.0000 [IU] | Freq: Every day | SUBCUTANEOUS | Status: DC
  Administered 2019-11-05 – 2019-11-08 (×4): 30 [IU] via SUBCUTANEOUS
  Filled 2019-11-05 (×5): qty 0.3

## 2019-11-05 MED ORDER — SORBITOL 70 % SOLN
960.0000 mL | TOPICAL_OIL | Freq: Once | ORAL | Status: DC
  Filled 2019-11-05: qty 473

## 2019-11-05 NOTE — NC FL2 (Signed)
[place] LEVEL OF CARE SCREENING TOOL     IDENTIFICATION  Patient Name: Alfred Miller Birthdate: May 28, 1950 Sex: male Admission Date (Current Location): 11/02/2019  [facility] and [place] Number:  [employer] and Address:  [place] [address], [place]      Provider Number: [id number]  Attending Physician Name and Address:  Kathie Dike, MD  Relative Name and Phone Number:  Oswaldo, Cueto (Spouse) [phone]    Current Level of Care: SNF Recommended Level of Care: [place] Prior Approval Number:    Date Approved/Denied: 11/05/19 PASRR Number: [id number]  Discharge Plan: ICF    Current Diagnoses: Patient Active Problem List   Diagnosis Date Noted  . Gangrene of left foot ([place])   . Wound, open, foot, left, initial encounter 11/02/2019  . Cellulitis and abscess of toe of left foot 11/02/2019  . Type 2 diabetes mellitus with foot ulcer ([place]) 11/02/2019  . Hyperlipidemia 11/02/2019  . AKI (acute kidney injury) ([place]) 11/02/2019  . Prolonged QT interval 11/02/2019  . Obesity (BMI 30.0-34.9) 11/02/2019  . Unilateral primary osteoarthritis, right knee 11/12/2017  . Unilateral primary osteoarthritis, left knee 11/12/2017  . Ejection fraction   . Aortic root dilatation ([place])   . Hypertension   . Diabetes mellitus   . CAD (coronary artery disease)   . Ejection fraction   . Aortic valve sclerosis     Orientation RESPIRATION BLADDER Height & Weight     Self, Time, Situation, Place  Normal Continent Weight: 243 lb 9.7 oz (110.5 kg) Height:  6\' 2"  (188 cm)  BEHAVIORAL SYMPTOMS/MOOD NEUROLOGICAL BOWEL NUTRITION STATUS      Continent Diet (Diet Carb Modified Fluid consistency: Thin; Room service appropriate? Yes)  AMBULATORY STATUS COMMUNICATION OF NEEDS Skin   Extensive Assist Verbally Surgical wounds (Left Above the knee amputation)                       Personal Care Assistance Level of  Assistance  Bathing, Feeding, Dressing, Total care Bathing Assistance: Maximum assistance Feeding assistance: Limited assistance Dressing Assistance: Maximum assistance Total Care Assistance: Maximum assistance   Functional Limitations Info  Sight, Hearing, Speech Sight Info: Adequate Hearing Info: Adequate Speech Info: Adequate    SPECIAL CARE FACTORS FREQUENCY  PT (By licensed PT)     PT Frequency: 5x per week              Contractures Contractures Info: Not present    Additional Factors Info  Code Status, Insulin Sliding Scale, Allergies Code Status Info: Full Allergies Info: Augmentin, amoxicillin-pot-Clavulanate, Cephalexin, Ciprofloxacin   Insulin Sliding Scale Info: Daily Before BedtimeCBG < 70: Implement Hypoglycemia Standing Orders and refer to Hypoglycemia Standing Orders sidebar report; CBG 70 - 120: 0 units; CBG 121 - 150: 0 units; CBG 151 - 200: 0 units;CBG 201 - 250: 2 units; CBG 251 - 300: 3 units; CBG 301 - 350: 4 units; CBG 351 - 400: 5 units; CBG > 400 call MD and obtain STAT lab verification3x daily with mealCBG < 70: Implement Hypoglycemia Standing Orders and refer to Hypoglycemia Standing Orders sidebar report. CBG 70 - 120: 0 units; CBG 121 - 150: 1 unit; CBG 151 - 200: 2 units; CBG 201 - 250: 3 units; CBG 251 - 300: 5 units; CBG 301 - 350: 7 units; CBG 351 - 400 9 units; CBG > 400 call MD and obtain STAT lab verification       Current  Medications (11/05/2019):  This is the current hospital active medication list Current Facility-Administered Medications  Medication Dose Route Frequency Provider Last Rate Last Admin  . (feeding supplement) PROSource Plus liquid 30 mL  30 mL Oral BID BM Aviva Signs, MD   30 mL at 11/05/19 1018  . acetaminophen (TYLENOL) tablet 650 mg  650 mg Oral Q6H PRN Aviva Signs, MD   650 mg at 11/02/19 2159   Or  . acetaminophen (TYLENOL) suppository 650 mg  650 mg Rectal Q6H PRN Aviva Signs, MD      . amLODipine (NORVASC)  tablet 10 mg  10 mg Oral Daily Aviva Signs, MD   10 mg at 11/05/19 1018  . atorvastatin (LIPITOR) tablet 10 mg  10 mg Oral Daily Aviva Signs, MD   10 mg at 11/05/19 1018  . carvedilol (COREG) tablet 12.5 mg  12.5 mg Oral BID WC Aviva Signs, MD   12.5 mg at 11/05/19 1018  . enoxaparin (LOVENOX) injection 40 mg  40 mg Subcutaneous Q24H Aviva Signs, MD   40 mg at 11/04/19 2209  . feeding supplement (ENSURE ENLIVE) (ENSURE ENLIVE) liquid 237 mL  237 mL Oral BID BM Aviva Signs, MD   237 mL at 11/05/19 1019  . HYDROmorphone (DILAUDID) injection 1 mg  1 mg Intravenous Q2H PRN Aviva Signs, MD   1 mg at 11/05/19 1018  . insulin aspart (novoLOG) injection 0-5 Units  0-5 Units Subcutaneous QHS Aviva Signs, MD   3 Units at 11/03/19 2249  . insulin aspart (novoLOG) injection 0-9 Units  0-9 Units Subcutaneous TID WC Aviva Signs, MD   3 Units at 11/05/19 1259  . insulin glargine (LANTUS) injection 30 Units  30 Units Subcutaneous QHS Memon, Jolaine Artist, MD      . morphine 2 MG/ML injection 2 mg  2 mg Intravenous Q4H PRN Adefeso, Oladapo, DO   2 mg at 11/03/19 2248  . multivitamin-lutein (OCUVITE-LUTEIN) capsule 1 capsule  1 capsule Oral Daily Aviva Signs, MD   1 capsule at 11/05/19 1018  . mupirocin ointment (BACTROBAN) 2 % 1 application  1 application Nasal BID Aviva Signs, MD   1 application at 35/36/14 1020  . oxyCODONE-acetaminophen (PERCOCET/ROXICET) 5-325 MG per tablet 1 tablet  1 tablet Oral Q4H PRN Aviva Signs, MD      . sorbitol, milk of mag, mineral oil, glycerin (SMOG) enema  960 mL Rectal Once Kathie Dike, MD         Discharge Medications: Please see discharge summary for a list of discharge medications.  Relevant Imaging Results:  Relevant Lab Results:   Additional Information Pt SSN: [id number]  Natasha Bence, LCSW

## 2019-11-05 NOTE — TOC Progression Note (Signed)
Transition of Care [facility]) - Progression Note    Patient Details  Name: Alfred Miller MRN: [id number] Date of Birth: 1950/09/26  [facility] CM/SW Contact  Natasha Bence, LCSW Phone Number: 11/05/2019, 1:06 PM  Clinical Narrative:    Patient decline SNF. CSW inquired about if patient would be agreeable with HHPT and HHRN. Patient agreeable to HHPT and [facility] placed referral for HHPT and HHRN.   Addendum 11/05/2019 Per MD, patient now agreeable to SNF. CSW inquired about patient's preferred facilities. Patient and family agreeable to referral to local facilities. CSW placed referral to local SNF's. Ins auth to be started by selected facility. TOC to follow.      Barriers to Discharge: Continued Medical Work up  Expected Discharge Plan and Services                                                 Social Determinants of Health (SDOH) Interventions    Readmission Risk Interventions No flowsheet data found.

## 2019-11-05 NOTE — Progress Notes (Signed)
1 Day Post-Op  Subjective: Is having incisional pain.  Dilaudid helpful.  Objective: Vital signs in last 24 hours: Temp:  [97.9 F (36.6 C)-98.9 F (37.2 C)] 98.9 F (37.2 C) (08/20 0605) Pulse Rate:  [62-87] 87 (08/20 0605) Resp:  [18-22] 19 (08/20 0605) BP: (146-187)/(74-94) 153/85 (08/20 0605) SpO2:  [88 %-100 %] 88 % (08/20 0605) Last BM Date: 11/02/19  Intake/Output from previous day: 08/19 0701 - 08/20 0700 In: 2085.6 [I.V.:1535.6; IV Piggyback:550] Out: 1250 [Urine:950; Blood:300] Intake/Output this shift: No intake/output data recorded.  General appearance: alert, cooperative and no distress Extremities: Left BKA dressing dry and intact.  Lab Results:  Recent Labs    11/04/19 0605 11/04/19 2111  WBC 15.5* 14.8*  HGB 10.7* 9.8*  HCT 33.5* 32.0*  PLT 447* 416*   BMET Recent Labs    11/04/19 0605 11/05/19 0633  NA 139 139  K 3.6 4.4  CL 100 102  CO2 28 29  GLUCOSE 214* 165*  BUN 17 12  CREATININE 0.79 0.73  CALCIUM 8.7* 8.2*   PT/INR Recent Labs    11/02/19 1715 11/03/19 0405  LABPROT 14.6 15.1  INR 1.2 1.2    Studies/Results: US ARTERIAL SEG MULTIPLE LE (ABI, SEGMENTAL PRESSURES, PVR'S)  Result Date: 11/03/2019 CLINICAL DATA:  Left foot wound.  Black left foot. EXAM: NONINVASIVE PHYSIOLOGIC VASCULAR STUDY OF BILATERAL LOWER EXTREMITIES TECHNIQUE: Non-invasive vascular evaluation of both lower extremities was performed at rest, including calculation of ankle-brachial indices, multiple segmental pressure evaluation, segmental Doppler and segmental pulse volume recording. COMPARISON:  None. FINDINGS: Right Lower Extremity Resting ABI:  1.10 Segmental Pressures: Elevated pressures in the calf likely related to incompressible vessels. Great toe pressure: 88 Arterial Waveforms: Biphasic and triphasic waveforms in the right upper thigh. Biphasic waveforms in the right popliteal artery. Monophasic waveforms in the right ankle. PVRs: Normal PVRs with  maintained waveform amplitude. Left Lower Extremity: Resting ABI: 0.64 Segmental Pressures: Significant pressure drop between the lower thigh and the calf. Great toe pressure: Could not be obtained Arterial Waveforms: Biphasic waveform in the left upper thigh. Monophasic waveform in the left mid thigh and left popliteal artery. No significant waveforms at the left ankle. Markedly decreased left digital waveforms compared to the right. PVRs: PVR waveforms are symmetric to the right lower extremity. Other: Symmetric upper extremity pressures. Ankle Brachial index > 1.4 Non diagnostic secondary to incompressible vessel calcifications 1.0-1.4       Normal 0.9-0.99     Borderline PAD 0.8-0.89     Mild PAD 0.5-0.79     Moderate PAD < 0.5          Severe PAD Toe Brachial Index Normal     >0.65 Moderate  0.53-0.64 Severe     <0.23 Toe Pressures Absolute toe pressure >65mmHg sufficient for wound healing. Toe pressures <61mmHg = critical limb ischemia. IMPRESSION: 1. Right ankle-brachial index is normal, measuring 1.10. 2. Left ankle-brachial index is 0.64 and suggestive for moderate peripheral arterial disease. Significant pressure gradient between the left lower thigh and the left calf is suggestive for occlusive disease in this area. No significant flow identified in the left digital arteries. Electronically Signed   By: Markus Daft M.D.   On: 11/03/2019 17:43    Anti-infectives: Anti-infectives (From admission, onward)   Start     Dose/Rate Route Frequency Ordered Stop   11/03/19 0700  vancomycin (VANCOREADY) IVPB 750 mg/150 mL        750 mg 150 mL/hr over 60 Minutes Intravenous Every  12 hours 11/02/19 1843     11/03/19 0600  piperacillin-tazobactam (ZOSYN) IVPB 3.375 g        3.375 g 12.5 mL/hr over 240 Minutes Intravenous Every 8 hours 11/02/19 2058     11/02/19 2100  piperacillin-tazobactam (ZOSYN) IVPB 3.375 g        3.375 g 100 mL/hr over 30 Minutes Intravenous  Once 11/02/19 2058 11/02/19 2210    11/02/19 1715  vancomycin (VANCOCIN) IVPB 1000 mg/200 mL premix        1,000 mg 200 mL/hr over 60 Minutes Intravenous Every 1 hr x 2 11/02/19 1710 11/02/19 2059   11/02/19 1700  vancomycin (VANCOCIN) IVPB 1000 mg/200 mL premix  Status:  Discontinued        1,000 mg 200 mL/hr over 60 Minutes Intravenous  Once 11/02/19 1647 11/02/19 1710   11/02/19 1700  cefTRIAXone (ROCEPHIN) 2 g in sodium chloride 0.9 % 100 mL IVPB        2 g 200 mL/hr over 30 Minutes Intravenous  Once 11/02/19 1647 11/02/19 1804      Assessment/Plan: s/p Procedure(s): LEFT BELOW KNEE AMPUTATION Impression: Stable on postoperative day 1.  PT into see the patient.  Do recommend SNF for rehabilitation and learning to transfer.  LOS: 3 days    Aviva Signs 11/05/2019

## 2019-11-05 NOTE — Care Management Important Message (Signed)
Important Message  Patient Details  Name: Alfred Miller MRN: [id number] Date of Birth: 14-Feb-1951   Medicare Important Message Given:  Yes     Tommy Medal 11/05/2019, 3:45 PM

## 2019-11-05 NOTE — Addendum Note (Signed)
Addendum  created 11/05/19 1339 by Hewitt Blade, CRNA   Charge Capture section accepted

## 2019-11-05 NOTE — Evaluation (Addendum)
Physical Therapy Evaluation Patient Details Name: Alfred Miller MRN: [id number] DOB: [birth year] Today's Date: 11/05/2019   History of Present Illness  Alfred Miller is a 69 y.o. male with medical history significant for type 2 diabetes mellitus, essential hypertension, CAD who presents to the emergency department due to worsening left foot wound.  Patient states that he accidentally stepped on a glass over a week ago and sustained a wound of left foot which has been worsening since onset.  He has since seen his PCP after sustaining the wound, but there was no antibiotics required at that time per patient.  Over the past 2 days, he noted discoloration to the left toes and has since developed subjective fever and chills, patient also noted a foul odor from the wound which prompted him to go to the ED for further evaluation.  He denies nausea, vomiting, headache, blurry vision, numbness or tingling.    Clinical Impression  The patient was fatigued and reported 10/10 pain in L knee and abdomen today. The patient had significant dizziness and lightheadedness upon sitting up with O2 sat 99%. The patient was only able to lift bottom off bed a few inches with max assist before having to sit back down. Patient demonstrates impaired sitting tolerance without loss of balance in seated and is limited by fatigue. He demonstrates significant RLE weakness today likely due to lethargy.The patient was left in bed with call bell in arm's reach after therapy. PLAN: The patient will continue to benefit from skilled physical therapy services in hospital at recommended venue below in order to improve balance, gait, and ADL's to promote independence in functional activities.      Follow Up Recommendations SNF    Equipment Recommendations    None recommended by PT.    Recommendations for Other Services   None recommended by PT.    Precautions / Restrictions Precautions Precautions: Fall;Knee Precaution Booklet Issued:  No Precaution Comments: L BKA Restrictions Weight Bearing Restrictions: No      Mobility  Bed Mobility Overal bed mobility: Needs Assistance Bed Mobility: Supine to Sit     Supine to sit: Min assist;Mod assist     General bed mobility comments: pt dizzy from supine to sit, O2 99%  Transfers Overall transfer level: Needs assistance Equipment used: Rolling walker (2 wheeled) Transfers: Sit to/from Stand Sit to Stand: Max assist         General transfer comment: BLE weakness, only able to lift bottom from chair a few inches  Ambulation/Gait                Stairs            Wheelchair Mobility    Modified Rankin (Stroke Patients Only)       Balance Overall balance assessment: Needs assistance Sitting-balance support: Bilateral upper extremity supported;Feet supported Sitting balance-Leahy Scale: Fair Sitting balance - Comments: increased sway w onset of lightheadedness   Standing balance support: Bilateral upper extremity supported;During functional activity Standing balance-Leahy Scale: Zero Standing balance comment: unable to achieve full sit to stand                             Pertinent Vitals/Pain Pain Assessment: 0-10 Pain Score: 10-Worst pain ever Pain Location: L knee and abdomen Pain Descriptors / Indicators: Jabbing;Sore;Sharp;Grimacing;Moaning Pain Intervention(s): Limited activity within patient's tolerance;Monitored during session;RN gave pain meds during session    Home Living Family/patient expects to be discharged to:: Private  residence Living Arrangements: Spouse/significant other Available Help at Discharge: Family;Available 24 hours/day Type of Home: House Home Access: Level entry     Home Layout: One level Home Equipment: Walker - 4 wheels;Cane - single point      Prior Function Level of Independence: Independent               Hand Dominance   Dominant Hand: Right    Extremity/Trunk Assessment    Upper Extremity Assessment Upper Extremity Assessment: Generalized weakness    Lower Extremity Assessment Lower Extremity Assessment: RLE deficits/detail RLE Deficits / Details: hip flex 2+/5, knee ext 3-/5, DF 5/5, knee flex 5/5    Cervical / Trunk Assessment Cervical / Trunk Assessment: Other exceptions Cervical / Trunk Exceptions: repeatedly dropped head into cervical flex, possibly due to fatigue  Communication   Communication: No difficulties  Cognition Arousal/Alertness: Lethargic (possibly due to lack of sleep) Behavior During Therapy: WFL for tasks assessed/performed Overall Cognitive Status: Within Functional Limits for tasks assessed                                        General Comments      Exercises     Assessment/Plan    PT Assessment Patient needs continued PT services  PT Problem List Decreased strength;Decreased range of motion;Decreased knowledge of use of DME;Decreased activity tolerance;Decreased safety awareness;Decreased balance;Decreased knowledge of precautions;Decreased mobility;Decreased cognition;Decreased coordination       PT Treatment Interventions DME instruction;Balance training;Gait training;Functional mobility training;Therapeutic activities;Therapeutic exercise;Patient/family education    PT Goals (Current goals can be found in the Care Plan section)  Acute Rehab PT Goals Patient Stated Goal: go to SNF PT Goal Formulation: With patient Time For Goal Achievement: 11/19/19 Potential to Achieve Goals: Good    Frequency Min 3X/week   Barriers to discharge        Co-evaluation               AM-PAC PT "6 Clicks" Mobility  Outcome Measure Help needed turning from your back to your side while in a flat bed without using bedrails?: A Little Help needed moving from lying on your back to sitting on the side of a flat bed without using bedrails?: A Lot Help needed moving to and from a bed to a chair (including a  wheelchair)?: Total Help needed standing up from a chair using your arms (e.g., wheelchair or bedside chair)?: A Lot Help needed to walk in hospital room?: Total Help needed climbing 3-5 steps with a railing? : Total 6 Click Score: 10    End of Session Equipment Utilized During Treatment: Gait belt Activity Tolerance: Patient limited by fatigue;Patient limited by pain Patient left: in bed;with call bell/phone within reach;with bed alarm set Nurse Communication: Mobility status;Precautions;Weight bearing status PT Visit Diagnosis: Unsteadiness on feet (R26.81);Other abnormalities of gait and mobility (R26.89);Muscle weakness (generalized) (M62.81);Other (comment) (L BKA)    Time: 0263-7858 PT Time Calculation (min) (ACUTE ONLY): 29 min   Charges:   PT Evaluation $PT Eval Moderate Complexity: 1 Mod PT Treatments $Therapeutic Activity: 8-22 mins        9:33 AM , 11/05/19 Karlyn Agee, SPT Physical Therapy with [facility] [phone] office     This qualified practitioner was present in the room guiding the student in service delivery. Therapy student was participating in the provision of services, and the practitioner was  not engaged in treating another patient or doing other tasks at the same time. 9:33 AM, 11/05/19 Mearl Latin PT, DPT Physical Therapist at [facility]

## 2019-11-05 NOTE — Progress Notes (Signed)
PROGRESS NOTE    Alfred Miller  OMV:[id number] DOB: 03/21/1950 DOA: 11/02/2019 PCP: Glenda Chroman, MD    Brief Narrative:  HPI: Alfred Miller is a 70 y.o. male with medical history significant for type 2 diabetes mellitus, essential hypertension, CAD who presents to the emergency department due to worsening left foot wound.  Patient states that he accidentally stepped on a glass over a week ago and sustained a wound of left foot which has been worsening since onset.  He has since seen his PCP after sustaining the wound, but there was no antibiotics required at that time per patient.  Over the past 2 days, he noted discoloration to the left toes and has since developed subjective fever and chills, patient also noted a foul odor from the wound which prompted him to go to the ED for further evaluation.  He denies nausea, vomiting, headache, blurry vision, numbness or tingling.  ED Course:  In the emergency department, he was hemodynamically stable.  Work-up in the ED showed leukocytosis with a left shift.  BUN/creatinine 25/1.31 (no prior labs for comparison).  Albumin 3.3, AST 42, ALT 50.  Left foot x-ray showed mild focal dorsal soft tissue swelling with no radiopaque soft tissue foreign bodies identified. He was started on IV Vancomycin and ceftriaxone.   Assessment & Plan:   Principal Problem:   Wound, open, foot, left, initial encounter Active Problems:   Hypertension   Cellulitis and abscess of toe of left foot   Type 2 diabetes mellitus with foot ulcer (HCC)   Hyperlipidemia   AKI (acute kidney injury) ([place])   Prolonged QT interval   Obesity (BMI 30.0-34.9)   Gangrene of left foot ([place])   1. Left foot gangrene.  Seen by general surgery.  Currently on IV antibiotics.  Arterial Doppler studies done show ABI of 0.6 on the left side indicating peripheral vascular disease.  Case reviewed with Dr. Donnetta Hutching on-call for vascular surgery.  It was felt that if patient's foot is not salvageable and  he needs below the knee amputation, he would not be a candidate for revascularization at this time.  Discussed with Dr. Arnoldo Morale and patient underwent left below the knee amputation on 8/19.  Further postoperative management per surgery. 2. Insulin-dependent diabetes, uncontrolled with hyperglycemia.  He is chronically on glipizide and Novolin N.  Will substitute with Lantus while in the hospital, start on sliding scale insulin.  Blood sugars have currently been stable 3. Hypertension.  Continued on Coreg and amlodipine.  Blood pressure currently stable. 4. Acute kidney injury.  Improved with IV fluids.  Secondary to #1. 5. Hyperlipidemia.  Continue statin   DVT prophylaxis: enoxaparin (LOVENOX) injection 40 mg Start: 11/03/19 2030 SCDs Start: 11/02/19 1928  Code Status: full code Family Communication: left voicemail for wife Disposition Plan: Status is: Inpatient  Remains inpatient appropriate because:Inpatient level of care appropriate due to severity of illness   Dispo: The patient is from: Home              Anticipated d/c is to: SNF              Anticipated d/c date is: 3 days              Patient currently is not medically stable to d/c.  Patient will need below the knee amputation for definitive treatment      Consultants:   General surgery  Procedures:     Antimicrobials:   Zosyn 8/17 >8/20  Vancomycin 8/17 >  8/20   Subjective: Complains of some postoperative pain.  Had some nausea and vomiting earlier today.  Has not had a bowel movement in several days.  Objective: Vitals:   11/04/19 2045 11/04/19 2132 11/05/19 0605 11/05/19 1527  BP:  (!) 147/74 (!) 153/85 (!) 157/86  Pulse:  64 87 75  Resp:  20 19 17   Temp:  98.9 F (37.2 C) 98.9 F (37.2 C) 99.6 F (37.6 C)  TempSrc:  Oral  Oral  SpO2: 97% 100% (!) 88% 100%  Weight:      Height:        Intake/Output Summary (Last 24 hours) at 11/05/2019 1822 Last data filed at 11/05/2019 1300 Gross per 24 hour    Intake 771.47 ml  Output 1300 ml  Net -528.53 ml   Filed Weights   11/02/19 1518 11/02/19 2239  Weight: 113.4 kg 110.5 kg    Examination: General exam: Alert, awake, oriented x 3 Respiratory system: Clear to auscultation. Respiratory effort normal. Cardiovascular system:RRR. No murmurs, rubs, gallops. Gastrointestinal system: Abdomen is nondistended, soft and nontender. No organomegaly or masses felt. Normal bowel sounds heard. Central nervous system: Alert and oriented. No focal neurological deficits. Extremities: left bka Skin: No rashes, lesions or ulcers Psychiatry: Judgement and insight appear normal. Mood & affect appropriate.    Data Reviewed: I have personally reviewed following labs and imaging studies  CBC: Recent Labs  Lab 11/02/19 1715 11/03/19 0405 11/04/19 0605 11/04/19 2111  WBC 21.0* 17.7* 15.5* 14.8*  NEUTROABS 16.1*  --   --   --   HGB 12.1* 11.2* 10.7* 9.8*  HCT 37.3* 36.0* 33.5* 32.0*  MCV 91.0 93.8 93.1 95.5  PLT 531* 465* 447* 161*   Basic Metabolic Panel: Recent Labs  Lab 11/02/19 1715 11/03/19 0405 11/04/19 0605 11/05/19 0633  NA 137 138 139 139  K 3.9 3.6 3.6 4.4  CL 96* 100 100 102  CO2 29 27 28 29   GLUCOSE 118* 114* 214* 165*  BUN 25* 23 17 12   CREATININE 1.31* 1.03 0.79 0.73  CALCIUM 8.9 8.7* 8.7* 8.2*  MG  --  2.3  --   --   PHOS  --  4.0  --   --    GFR: Estimated Creatinine Clearance: 115.3 mL/min (by C-G formula based on SCr of 0.73 mg/dL). Liver Function Tests: Recent Labs  Lab 11/02/19 1715 11/03/19 0405  AST 42* 30  ALT 50* 39  ALKPHOS 70 65  BILITOT 0.6 0.5  PROT 8.9* 7.7  ALBUMIN 3.3* 2.9*   No results for input(s): LIPASE, AMYLASE in the last 168 hours. No results for input(s): AMMONIA in the last 168 hours. Coagulation Profile: Recent Labs  Lab 11/02/19 1715 11/03/19 0405  INR 1.2 1.2   Cardiac Enzymes: No results for input(s): CKTOTAL, CKMB, CKMBINDEX, TROPONINI in the last 168 hours. BNP (last 3  results) No results for input(s): PROBNP in the last 8760 hours. HbA1C: Recent Labs    11/02/19 1901  HGBA1C 7.2*   CBG: Recent Labs  Lab 11/04/19 1504 11/04/19 1702 11/04/19 2132 11/05/19 0829 11/05/19 1241  GLUCAP 159* 153* 214* 150* 212*   Lipid Profile: No results for input(s): CHOL, HDL, LDLCALC, TRIG, CHOLHDL, LDLDIRECT in the last 72 hours. Thyroid Function Tests: No results for input(s): TSH, T4TOTAL, FREET4, T3FREE, THYROIDAB in the last 72 hours. Anemia Panel: No results for input(s): VITAMINB12, FOLATE, FERRITIN, TIBC, IRON, RETICCTPCT in the last 72 hours. Sepsis Labs: Recent Labs  Lab 11/02/19 1715 11/02/19  1901  LATICACIDVEN 1.2 1.4    Recent Results (from the past 240 hour(s))  SARS Coronavirus 2 by RT PCR (hospital order, performed in [facility] hospital lab) Nasopharyngeal Nasopharyngeal Swab     Status: None   Collection Time: 11/02/19  4:48 PM   Specimen: Nasopharyngeal Swab  Result Value Ref Range Status   SARS Coronavirus 2 NEGATIVE NEGATIVE Final    Comment: (NOTE) SARS-CoV-2 target nucleic acids are NOT DETECTED.  The SARS-CoV-2 RNA is generally detectable in upper and lower respiratory specimens during the acute phase of infection. The lowest concentration of SARS-CoV-2 viral copies this assay can detect is 250 copies / mL. A negative result does not preclude SARS-CoV-2 infection and should not be used as the sole basis for treatment or other patient management decisions.  A negative result may occur with improper specimen collection / handling, submission of specimen other than nasopharyngeal swab, presence of viral mutation(s) within the areas targeted by this assay, and inadequate number of viral copies (<250 copies / mL). A negative result must be combined with clinical observations, patient history, and epidemiological information.  Fact Sheet for Patients:   StrictlyIdeas.no  Fact Sheet for Healthcare  Providers: BankingDealers.co.za  This test is not yet approved or  cleared by the [place] FDA and has been authorized for detection and/or diagnosis of SARS-CoV-2 by FDA under an Emergency Use Authorization (EUA).  This EUA will remain in effect (meaning this test can be used) for the duration of the COVID-19 declaration under Section 564(b)(1) of the Act, 21 U.S.C. section 360bbb-3(b)(1), unless the authorization is terminated or revoked sooner.  Performed at [facility], [address], [place], [place] [address]   Blood Culture (routine x 2)     Status: None (Preliminary result)   Collection Time: 11/02/19  5:05 PM   Specimen: BLOOD RIGHT HAND  Result Value Ref Range Status   Specimen Description   Final    BLOOD RIGHT HAND BOTTLES DRAWN AEROBIC AND ANAEROBIC   Special Requests   Final    Blood Culture results may not be optimal due to an inadequate volume of blood received in culture bottles   Culture   Final    NO GROWTH 3 DAYS Performed at [facility], [address] [place], [place] [address]    Report Status PENDING  Incomplete  Blood Culture (routine x 2)     Status: None (Preliminary result)   Collection Time: 11/02/19  5:15 PM   Specimen: Left Antecubital; Blood  Result Value Ref Range Status   Specimen Description   Final    LEFT ANTECUBITAL BOTTLES DRAWN AEROBIC AND ANAEROBIC   Special Requests Blood Culture adequate volume  Final   Culture   Final    NO GROWTH 3 DAYS Performed at [facility], [address], [place], [place] [address]    Report Status PENDING  Incomplete  Surgical PCR screen     Status: Abnormal   Collection Time: 11/02/19 11:00 PM   Specimen: Nasal Mucosa; Nasal Swab  Result Value Ref Range Status   MRSA, PCR NEGATIVE NEGATIVE Final   Staphylococcus aureus POSITIVE (A) NEGATIVE Final    Comment: RESULT CALLED TO, READ BACK BY AND VERIFIED WITH: BENGTSON,T AT 0745 ON 11/03/19 BY HUFFINES,S (NOTE) The Xpert SA  Assay (FDA approved for NASAL specimens in patients 71 years of age and older), is one component of a comprehensive surveillance program. It is not intended to diagnose infection nor to guide or monitor  treatment. Performed at [facility], [address], [place], [place] [address]          Radiology Studies: No results found.      Scheduled Meds: . (feeding supplement) PROSource Plus  30 mL Oral BID BM  . amLODipine  10 mg Oral Daily  . atorvastatin  10 mg Oral Daily  . carvedilol  12.5 mg Oral BID WC  . enoxaparin (LOVENOX) injection  40 mg Subcutaneous Q24H  . feeding supplement (ENSURE ENLIVE)  237 mL Oral BID BM  . insulin aspart  0-5 Units Subcutaneous QHS  . insulin aspart  0-9 Units Subcutaneous TID WC  . insulin glargine  30 Units Subcutaneous QHS  . multivitamin-lutein  1 capsule Oral Daily  . mupirocin ointment  1 application Nasal BID  . sorbitol, milk of mag, mineral oil, glycerin (SMOG) enema  960 mL Rectal Once   Continuous Infusions:    LOS: 3 days    Time spent:35 mins    Kathie Dike, MD [facility]   If 7PM-7AM, please contact night-coverage [email or web address]  11/05/2019, 6:22 PM

## 2019-11-05 NOTE — Plan of Care (Addendum)
  Problem: Acute Rehab PT Goals(only PT should resolve) Goal: Pt Will Go Supine/Side To Sit Outcome: Progressing Flowsheets (Taken 11/05/2019 0910) Pt will go Supine/Side to Sit: with min guard assist Goal: Patient Will Transfer Sit To/From Stand Outcome: Progressing Flowsheets (Taken 11/05/2019 0910) Patient will transfer sit to/from stand: with moderate assist Goal: Pt Will Transfer Bed To Chair/Chair To Bed Outcome: Progressing Flowsheets (Taken 11/05/2019 0910) Pt will Transfer Bed to Chair/Chair to Bed: with max assist Goal: Pt Will Ambulate Outcome: Progressing Flowsheets (Taken 11/05/2019 0910) Pt will Ambulate: . 10 feet . with maximum assist . with rolling walker  9:10 AM , 11/05/19 Karlyn Agee, SPT Physical Therapy with [facility] [phone] office    1:02 PM, 11/05/19 Mearl Latin PT, DPT Physical Therapist at [facility]

## 2019-11-05 NOTE — Addendum Note (Signed)
Addendum  created 11/05/19 1321 by Tacy Learn, CRNA   Intraprocedure Event edited, Intraprocedure Staff edited

## 2019-11-06 LAB — GLUCOSE, CAPILLARY
Glucose-Capillary: 254 mg/dL — ABNORMAL HIGH (ref 70–99)
Glucose-Capillary: 254 mg/dL — ABNORMAL HIGH (ref 70–99)

## 2019-11-06 LAB — CBC
HCT: 29.5 % — ABNORMAL LOW (ref 39.0–52.0)
Hemoglobin: 9.2 g/dL — ABNORMAL LOW (ref 13.0–17.0)
MCH: 29.3 pg (ref 26.0–34.0)
MCHC: 31.2 g/dL (ref 30.0–36.0)
MCV: 93.9 fL (ref 80.0–100.0)
Platelets: 426 10*3/uL — ABNORMAL HIGH (ref 150–400)
RBC: 3.14 MIL/uL — ABNORMAL LOW (ref 4.22–5.81)
RDW: 13.4 % (ref 11.5–15.5)
WBC: 14.6 10*3/uL — ABNORMAL HIGH (ref 4.0–10.5)
nRBC: 0 % (ref 0.0–0.2)

## 2019-11-06 LAB — BASIC METABOLIC PANEL
Anion gap: 8 (ref 5–15)
BUN: 12 mg/dL (ref 8–23)
CO2: 29 mmol/L (ref 22–32)
Calcium: 8.3 mg/dL — ABNORMAL LOW (ref 8.9–10.3)
Chloride: 99 mmol/L (ref 98–111)
Creatinine, Ser: 0.7 mg/dL (ref 0.61–1.24)
GFR calc Af Amer: >60 mL/min (ref >60)
GFR calc non Af Amer: >60 mL/min (ref >60)
Glucose, Bld: 140 mg/dL — ABNORMAL HIGH (ref 70–99)
Potassium: 3.7 mmol/L (ref 3.5–5.1)
Sodium: 136 mmol/L (ref 135–145)

## 2019-11-06 MED ORDER — LACTATED RINGERS IV SOLN: INTRAVENOUS | Status: DC

## 2019-11-06 MED ORDER — PROCHLORPERAZINE EDISYLATE 10 MG/2ML IJ SOLN
10.0000 mg | Freq: Four times a day (QID) | INTRAMUSCULAR | Status: DC | PRN
  Administered 2019-11-06: 10 mg via INTRAVENOUS
  Filled 2019-11-06: qty 2

## 2019-11-06 NOTE — Progress Notes (Signed)
2 Days Post-Op  Subjective: Patient denies any significant incisional pain. Requires significant assistance in transfers.  Objective: Vital signs in last 24 hours: Temp:  [98.1 F (36.7 C)-99.7 F (37.6 C)] 98.1 F (36.7 C) (08/21 0520) Pulse Rate:  [67-85] 85 (08/21 0903) Resp:  [16-17] 16 (08/21 0903) BP: (119-169)/(78-89) 119/78 (08/21 0903) SpO2:  [92 %-100 %] 95 % (08/21 0903) Last BM Date: 11/02/19  Intake/Output from previous day: 08/20 0701 - 08/21 0700 In: 720 [P.O.:720] Out: 2200 [Urine:2200] Intake/Output this shift: Total I/O In: -  Out: 500 [Urine:500]  General appearance: alert, cooperative and no distress Extremities: Left BKA incision healing well. No ecchymosis or drainage.  Lab Results:  Recent Labs    11/04/19 2111 11/06/19 0606  WBC 14.8* 14.6*  HGB 9.8* 9.2*  HCT 32.0* 29.5*  PLT 416* 426*   BMET Recent Labs    11/05/19 0633 11/06/19 0606  NA 139 136  K 4.4 3.7  CL 102 99  CO2 29 29  GLUCOSE 165* 140*  BUN 12 12  CREATININE 0.73 0.70  CALCIUM 8.2* 8.3*   PT/INR No results for input(s): LABPROT, INR in the last 72 hours.  Studies/Results: No results found.  Anti-infectives: Anti-infectives (From admission, onward)   Start     Dose/Rate Route Frequency Ordered Stop   11/03/19 0700  vancomycin (VANCOREADY) IVPB 750 mg/150 mL  Status:  Discontinued        750 mg 150 mL/hr over 60 Minutes Intravenous Every 12 hours 11/02/19 1843 11/05/19 1335   11/03/19 0600  piperacillin-tazobactam (ZOSYN) IVPB 3.375 g  Status:  Discontinued        3.375 g 12.5 mL/hr over 240 Minutes Intravenous Every 8 hours 11/02/19 2058 11/05/19 1335   11/02/19 2100  piperacillin-tazobactam (ZOSYN) IVPB 3.375 g        3.375 g 100 mL/hr over 30 Minutes Intravenous  Once 11/02/19 2058 11/02/19 2210   11/02/19 1715  vancomycin (VANCOCIN) IVPB 1000 mg/200 mL premix        1,000 mg 200 mL/hr over 60 Minutes Intravenous Every 1 hr x 2 11/02/19 1710 11/02/19 2059    11/02/19 1700  vancomycin (VANCOCIN) IVPB 1000 mg/200 mL premix  Status:  Discontinued        1,000 mg 200 mL/hr over 60 Minutes Intravenous  Once 11/02/19 1647 11/02/19 1710   11/02/19 1700  cefTRIAXone (ROCEPHIN) 2 g in sodium chloride 0.9 % 100 mL IVPB        2 g 200 mL/hr over 30 Minutes Intravenous  Once 11/02/19 1647 11/02/19 1804      Assessment/Plan: s/p Procedure(s): LEFT BELOW KNEE AMPUTATION Impression: Stable on postoperative day 2. Healing well from left below the knee amputation. I strongly suggested to the patient that he go to a skilled nursing unit for rehab as he requires significant assistance for transfers.   LOS: 4 days    Aviva Signs 11/06/2019

## 2019-11-06 NOTE — Progress Notes (Signed)
PROGRESS NOTE    Alfred Miller  MVH:[id number] DOB: 05-18-1950 DOA: 11/02/2019 PCP: Glenda Chroman, MD    Brief Narrative:  HPI: Alfred Miller is a 69 y.o. male with medical history significant for type 2 diabetes mellitus, essential hypertension, CAD who presents to the emergency department due to worsening left foot wound.  Patient states that he accidentally stepped on a glass over a week ago and sustained a wound of left foot which has been worsening since onset.  He has since seen his PCP after sustaining the wound, but there was no antibiotics required at that time per patient.  Over the past 2 days, he noted discoloration to the left toes and has since developed subjective fever and chills, patient also noted a foul odor from the wound which prompted him to go to the ED for further evaluation.  He denies nausea, vomiting, headache, blurry vision, numbness or tingling.  ED Course:  In the emergency department, he was hemodynamically stable.  Work-up in the ED showed leukocytosis with a left shift.  BUN/creatinine 25/1.31 (no prior labs for comparison).  Albumin 3.3, AST 42, ALT 50.  Left foot x-ray showed mild focal dorsal soft tissue swelling with no radiopaque soft tissue foreign bodies identified. He was started on IV Vancomycin and ceftriaxone.   Assessment & Plan:   Principal Problem:   Wound, open, foot, left, initial encounter Active Problems:   Hypertension   Cellulitis and abscess of toe of left foot   Type 2 diabetes mellitus with foot ulcer (HCC)   Hyperlipidemia   AKI (acute kidney injury) ([place])   Prolonged QT interval   Obesity (BMI 30.0-34.9)   Gangrene of left foot ([place])   1. Left foot gangrene.  Seen by general surgery.  Currently on IV antibiotics.  Arterial Doppler studies done show ABI of 0.6 on the left side indicating peripheral vascular disease.  Case reviewed with Dr. Donnetta Hutching on-call for vascular surgery.  It was felt that if patient's foot is not salvageable and  he needs below the knee amputation, he would not be a candidate for revascularization at this time.  Discussed with Dr. Arnoldo Morale and patient underwent left below the knee amputation on 8/19.  Further postoperative management per surgery. 2. Insulin-dependent diabetes, uncontrolled with hyperglycemia.  He is chronically on glipizide and Novolin N.  Will substitute with Lantus while in the hospital, start on sliding scale insulin.  Blood sugars have currently been stable 3. Hypertension.  Continued on Coreg and amlodipine.  Blood pressure currently stable. 4. Acute kidney injury.  Improved with IV fluids.  Secondary to #1. 5. Hyperlipidemia.  Continue statin 6. Anemia. likely related to hemodilution as well as acute blood loss from surgery.  Continue to follow hemoglobin.   DVT prophylaxis: enoxaparin (LOVENOX) injection 40 mg Start: 11/03/19 2030 SCDs Start: 11/02/19 1928  Code Status: full code Family Communication: Discussed with patient Disposition Plan: Status is: Inpatient  Remains inpatient appropriate because:Inpatient level of care appropriate due to severity of illness   Dispo: The patient is from: Home              Anticipated d/c is to: SNF              Anticipated d/c date is: 1 day              Patient currently is medically stable to d/c.       Consultants:   General surgery  Procedures:     Antimicrobials:  Zosyn 8/17 >8/20  Vancomycin 8/17 >8/20   Subjective: Continues to have nausea this morning.  Has not had a bowel movement in several days.  Feels as though he may have to move his bowels now.  Objective: Vitals:   11/06/19 0900 11/06/19 0903 11/06/19 1411 11/06/19 1800  BP: 119/78 119/78 128/79 (!) 150/77  Pulse: 85 85 83 88  Resp:  16 18   Temp:   98.6 F (37 C)   TempSrc:   Oral   SpO2:  95% 96%   Weight:      Height:        Intake/Output Summary (Last 24 hours) at 11/06/2019 1829 Last data filed at 11/06/2019 0900 Gross per 24 hour    Intake 360 ml  Output 1500 ml  Net -1140 ml   Filed Weights   11/02/19 1518 11/02/19 2239  Weight: 113.4 kg 110.5 kg    Examination: General exam: Alert, awake, oriented x 3 Respiratory system: Clear to auscultation. Respiratory effort normal. Cardiovascular system:RRR. No murmurs, rubs, gallops. Gastrointestinal system: Abdomen is full, soft and nontender. No organomegaly or masses felt. Normal bowel sounds heard. Central nervous system: Alert and oriented. No focal neurological deficits. Extremities: Left BKA Skin: No rashes, lesions or ulcers Psychiatry: Judgement and insight appear normal. Mood & affect appropriate.    Data Reviewed: I have personally reviewed following labs and imaging studies  CBC: Recent Labs  Lab 11/02/19 1715 11/03/19 0405 11/04/19 0605 11/04/19 2111 11/06/19 0606  WBC 21.0* 17.7* 15.5* 14.8* 14.6*  NEUTROABS 16.1*  --   --   --   --   HGB 12.1* 11.2* 10.7* 9.8* 9.2*  HCT 37.3* 36.0* 33.5* 32.0* 29.5*  MCV 91.0 93.8 93.1 95.5 93.9  PLT 531* 465* 447* 416* 009*   Basic Metabolic Panel: Recent Labs  Lab 11/02/19 1715 11/03/19 0405 11/04/19 0605 11/05/19 0633 11/06/19 0606  NA 137 138 139 139 136  K 3.9 3.6 3.6 4.4 3.7  CL 96* 100 100 102 99  CO2 29 27 28 29 29   GLUCOSE 118* 114* 214* 165* 140*  BUN 25* 23 17 12 12   CREATININE 1.31* 1.03 0.79 0.73 0.70  CALCIUM 8.9 8.7* 8.7* 8.2* 8.3*  MG  --  2.3  --   --   --   PHOS  --  4.0  --   --   --    GFR: Estimated Creatinine Clearance: 115.3 mL/min (by C-G formula based on SCr of 0.7 mg/dL). Liver Function Tests: Recent Labs  Lab 11/02/19 1715 11/03/19 0405  AST 42* 30  ALT 50* 39  ALKPHOS 70 65  BILITOT 0.6 0.5  PROT 8.9* 7.7  ALBUMIN 3.3* 2.9*   No results for input(s): LIPASE, AMYLASE in the last 168 hours. No results for input(s): AMMONIA in the last 168 hours. Coagulation Profile: Recent Labs  Lab 11/02/19 1715 11/03/19 0405  INR 1.2 1.2   Cardiac Enzymes: No  results for input(s): CKTOTAL, CKMB, CKMBINDEX, TROPONINI in the last 168 hours. BNP (last 3 results) No results for input(s): PROBNP in the last 8760 hours. HbA1C: No results for input(s): HGBA1C in the last 72 hours. CBG: Recent Labs  Lab 11/04/19 2132 11/05/19 0829 11/05/19 1241 11/05/19 2119 11/06/19 1642  GLUCAP 214* 150* 212* 156* 254*   Lipid Profile: No results for input(s): CHOL, HDL, LDLCALC, TRIG, CHOLHDL, LDLDIRECT in the last 72 hours. Thyroid Function Tests: No results for input(s): TSH, T4TOTAL, FREET4, T3FREE, THYROIDAB in the last 72 hours.  Anemia Panel: No results for input(s): VITAMINB12, FOLATE, FERRITIN, TIBC, IRON, RETICCTPCT in the last 72 hours. Sepsis Labs: Recent Labs  Lab 11/02/19 1715 11/02/19 1901  LATICACIDVEN 1.2 1.4    Recent Results (from the past 240 hour(s))  SARS Coronavirus 2 by RT PCR (hospital order, performed in [facility] hospital lab) Nasopharyngeal Nasopharyngeal Swab     Status: None   Collection Time: 11/02/19  4:48 PM   Specimen: Nasopharyngeal Swab  Result Value Ref Range Status   SARS Coronavirus 2 NEGATIVE NEGATIVE Final    Comment: (NOTE) SARS-CoV-2 target nucleic acids are NOT DETECTED.  The SARS-CoV-2 RNA is generally detectable in upper and lower respiratory specimens during the acute phase of infection. The lowest concentration of SARS-CoV-2 viral copies this assay can detect is 250 copies / mL. A negative result does not preclude SARS-CoV-2 infection and should not be used as the sole basis for treatment or other patient management decisions.  A negative result may occur with improper specimen collection / handling, submission of specimen other than nasopharyngeal swab, presence of viral mutation(s) within the areas targeted by this assay, and inadequate number of viral copies (<250 copies / mL). A negative result must be combined with clinical observations, patient history, and epidemiological  information.  Fact Sheet for Patients:   StrictlyIdeas.no  Fact Sheet for Healthcare Providers: BankingDealers.co.za  This test is not yet approved or  cleared by the [place] FDA and has been authorized for detection and/or diagnosis of SARS-CoV-2 by FDA under an Emergency Use Authorization (EUA).  This EUA will remain in effect (meaning this test can be used) for the duration of the COVID-19 declaration under Section 564(b)(1) of the Act, 21 U.S.C. section 360bbb-3(b)(1), unless the authorization is terminated or revoked sooner.  Performed at [facility], [address] [place], [place] [address]   Blood Culture (routine x 2)     Status: None (Preliminary result)   Collection Time: 11/02/19  5:05 PM   Specimen: BLOOD RIGHT HAND  Result Value Ref Range Status   Specimen Description   Final    BLOOD RIGHT HAND BOTTLES DRAWN AEROBIC AND ANAEROBIC   Special Requests   Final    Blood Culture results may not be optimal due to an inadequate volume of blood received in culture bottles   Culture   Final    NO GROWTH 4 DAYS Performed at [facility], [address], [place], [place] [address]    Report Status PENDING  Incomplete  Blood Culture (routine x 2)     Status: None (Preliminary result)   Collection Time: 11/02/19  5:15 PM   Specimen: Left Antecubital; Blood  Result Value Ref Range Status   Specimen Description   Final    LEFT ANTECUBITAL BOTTLES DRAWN AEROBIC AND ANAEROBIC   Special Requests Blood Culture adequate volume  Final   Culture   Final    NO GROWTH 4 DAYS Performed at [facility], [address] [place], [place] [address]    Report Status PENDING  Incomplete  Surgical PCR screen     Status: Abnormal   Collection Time: 11/02/19 11:00 PM   Specimen: Nasal Mucosa; Nasal Swab  Result Value Ref Range Status   MRSA, PCR NEGATIVE NEGATIVE Final   Staphylococcus aureus POSITIVE (A) NEGATIVE Final    Comment:  RESULT CALLED TO, READ BACK BY AND VERIFIED WITH: BENGTSON,T AT 0745 ON 11/03/19 BY HUFFINES,S (NOTE) The Xpert SA Assay (FDA approved for NASAL specimens in patients  41 years of age and older), is one component of a comprehensive surveillance program. It is not intended to diagnose infection nor to guide or monitor treatment. Performed at [facility], [address], [place], [place] [address]          Radiology Studies: No results found.      Scheduled Meds: . (feeding supplement) PROSource Plus  30 mL Oral BID BM  . amLODipine  10 mg Oral Daily  . atorvastatin  10 mg Oral Daily  . carvedilol  12.5 mg Oral BID WC  . enoxaparin (LOVENOX) injection  40 mg Subcutaneous Q24H  . feeding supplement (ENSURE ENLIVE)  237 mL Oral BID BM  . insulin aspart  0-5 Units Subcutaneous QHS  . insulin aspart  0-9 Units Subcutaneous TID WC  . insulin glargine  30 Units Subcutaneous QHS  . multivitamin-lutein  1 capsule Oral Daily  . mupirocin ointment  1 application Nasal BID  . sorbitol, milk of mag, mineral oil, glycerin (SMOG) enema  960 mL Rectal Once   Continuous Infusions:    LOS: 4 days    Time spent: 35 mins    Kathie Dike, MD [facility]   If 7PM-7AM, please contact night-coverage [email or web address]  11/06/2019, 6:29 PM

## 2019-11-07 LAB — CULTURE, BLOOD (ROUTINE X 2)
Culture: NO GROWTH
Culture: NO GROWTH
Special Requests: ADEQUATE

## 2019-11-07 LAB — CBC
HCT: 28.3 % — ABNORMAL LOW (ref 39.0–52.0)
Hemoglobin: 8.9 g/dL — ABNORMAL LOW (ref 13.0–17.0)
MCH: 29.2 pg (ref 26.0–34.0)
MCHC: 31.4 g/dL (ref 30.0–36.0)
MCV: 92.8 fL (ref 80.0–100.0)
Platelets: 440 10*3/uL — ABNORMAL HIGH (ref 150–400)
RBC: 3.05 MIL/uL — ABNORMAL LOW (ref 4.22–5.81)
RDW: 13.2 % (ref 11.5–15.5)
WBC: 15.4 10*3/uL — ABNORMAL HIGH (ref 4.0–10.5)
nRBC: 0 % (ref 0.0–0.2)

## 2019-11-07 LAB — GLUCOSE, CAPILLARY
Glucose-Capillary: 170 mg/dL — ABNORMAL HIGH (ref 70–99)
Glucose-Capillary: 190 mg/dL — ABNORMAL HIGH (ref 70–99)
Glucose-Capillary: 214 mg/dL — ABNORMAL HIGH (ref 70–99)
Glucose-Capillary: 232 mg/dL — ABNORMAL HIGH (ref 70–99)

## 2019-11-07 MED ORDER — LISINOPRIL 10 MG PO TABS
40.0000 mg | ORAL_TABLET | Freq: Every day | ORAL | Status: DC
  Administered 2019-11-07 – 2019-11-09 (×3): 40 mg via ORAL
  Filled 2019-11-07 (×3): qty 4

## 2019-11-07 NOTE — Progress Notes (Signed)
PROGRESS NOTE    Alfred Miller  AST:[id number] DOB: [birth year] DOA: 11/02/2019 PCP: Glenda Chroman, MD    Brief Narrative:  HPI: Alfred Miller is a 69 y.o. male with medical history significant for type 2 diabetes mellitus, essential hypertension, CAD who presents to the emergency department due to worsening left foot wound.  Patient states that he accidentally stepped on a glass over a week ago and sustained a wound of left foot which has been worsening since onset.  He has since seen his PCP after sustaining the wound, but there was no antibiotics required at that time per patient.  Over the past 2 days, he noted discoloration to the left toes and has since developed subjective fever and chills, patient also noted a foul odor from the wound which prompted him to go to the ED for further evaluation.  He denies nausea, vomiting, headache, blurry vision, numbness or tingling.  ED Course:  In the emergency department, he was hemodynamically stable.  Work-up in the ED showed leukocytosis with a left shift.  BUN/creatinine 25/1.31 (no prior labs for comparison).  Albumin 3.3, AST 42, ALT 50.  Left foot x-ray showed mild focal dorsal soft tissue swelling with no radiopaque soft tissue foreign bodies identified. He was started on IV Vancomycin and ceftriaxone.   Assessment & Plan:   Principal Problem:   Wound, open, foot, left, initial encounter Active Problems:   Hypertension   Cellulitis and abscess of toe of left foot   Type 2 diabetes mellitus with foot ulcer (HCC)   Hyperlipidemia   AKI (acute kidney injury) ([place])   Prolonged QT interval   Obesity (BMI 30.0-34.9)   Gangrene of left foot ([place])   1. Left foot gangrene.  Seen by general surgery.  Currently on IV antibiotics.  Arterial Doppler studies done show ABI of 0.6 on the left side indicating peripheral vascular disease.  Case reviewed with Dr. Donnetta Hutching on-call for vascular surgery.  It was felt that if patient's foot is not salvageable and  he needs below the knee amputation, he would not be a candidate for revascularization at this time.  Discussed with Dr. Arnoldo Morale and patient underwent left below the knee amputation on 8/19.  Further postoperative management per surgery. 2. Insulin-dependent diabetes, uncontrolled with hyperglycemia.  He is chronically on glipizide and Novolin N.  Currently on Lantus while in the hospital and on sliding scale insulin.  Blood sugars have currently been stable 3. Hypertension.  Continued on Coreg and amlodipine.  Blood pressure currently stable. 4. Acute kidney injury.  Improved with IV fluids.  Secondary to #1. 5. Hyperlipidemia.  Continue statin 6. Anemia. likely related to hemodilution as well as acute blood loss from surgery.  Continue to follow hemoglobin.   DVT prophylaxis: enoxaparin (LOVENOX) injection 40 mg Start: 11/03/19 2030 SCDs Start: 11/02/19 1928  Code Status: full code Family Communication: Discussed with patient, left voicemail for wife. Disposition Plan: Status is: Inpatient  Remains inpatient appropriate because:Inpatient level of care appropriate due to severity of illness   Dispo: The patient is from: Home              Anticipated d/c is to: SNF              Anticipated d/c date is: 1 day              Patient currently is medically stable to d/c.       Consultants:   General surgery  Procedures:  Antimicrobials:   Zosyn 8/17 >8/20  Vancomycin 8/17 >8/20   Subjective: Had a bowel movement yesterday.  Overall nausea has improved.  Pain appears to be reasonably controlled.  Objective: Vitals:   11/06/19 1800 11/06/19 2052 11/07/19 0546 11/07/19 1309  BP: (!) 150/77 (!) 161/92 (!) 168/85 (!) 160/73  Pulse: 88 96 78 74  Resp:  18 18 19   Temp:  99.1 F (37.3 C) 99.9 F (37.7 C) 99.1 F (37.3 C)  TempSrc:  Oral Oral Oral  SpO2:  (!) 87% 98% 94%  Weight:      Height:        Intake/Output Summary (Last 24 hours) at 11/07/2019 1953 Last data  filed at 11/07/2019 1700 Gross per 24 hour  Intake 480 ml  Output 1850 ml  Net -1370 ml   Filed Weights   11/02/19 1518 11/02/19 2239  Weight: 113.4 kg 110.5 kg    Examination: General exam: Alert, awake, oriented x 3 Respiratory system: Clear to auscultation. Respiratory effort normal. Cardiovascular system:RRR. No murmurs, rubs, gallops. Gastrointestinal system: Abdomen is nondistended, soft and nontender. No organomegaly or masses felt. Normal bowel sounds heard. Central nervous system: Alert and oriented. No focal neurological deficits. Extremities: left bka Skin: No rashes, lesions or ulcers Psychiatry: Judgement and insight appear normal. Mood & affect appropriate.     Data Reviewed: I have personally reviewed following labs and imaging studies  CBC: Recent Labs  Lab 11/02/19 1715 11/02/19 1715 11/03/19 0405 11/04/19 0605 11/04/19 2111 11/06/19 0606 11/07/19 0627  WBC 21.0*   < > 17.7* 15.5* 14.8* 14.6* 15.4*  NEUTROABS 16.1*  --   --   --   --   --   --   HGB 12.1*   < > 11.2* 10.7* 9.8* 9.2* 8.9*  HCT 37.3*   < > 36.0* 33.5* 32.0* 29.5* 28.3*  MCV 91.0   < > 93.8 93.1 95.5 93.9 92.8  PLT 531*   < > 465* 447* 416* 426* 440*   < > = values in this interval not displayed.   Basic Metabolic Panel: Recent Labs  Lab 11/02/19 1715 11/03/19 0405 11/04/19 0605 11/05/19 0633 11/06/19 0606  NA 137 138 139 139 136  K 3.9 3.6 3.6 4.4 3.7  CL 96* 100 100 102 99  CO2 29 27 28 29 29   GLUCOSE 118* 114* 214* 165* 140*  BUN 25* 23 17 12 12   CREATININE 1.31* 1.03 0.79 0.73 0.70  CALCIUM 8.9 8.7* 8.7* 8.2* 8.3*  MG  --  2.3  --   --   --   PHOS  --  4.0  --   --   --    GFR: Estimated Creatinine Clearance: 115.3 mL/min (by C-G formula based on SCr of 0.7 mg/dL). Liver Function Tests: Recent Labs  Lab 11/02/19 1715 11/03/19 0405  AST 42* 30  ALT 50* 39  ALKPHOS 70 65  BILITOT 0.6 0.5  PROT 8.9* 7.7  ALBUMIN 3.3* 2.9*   No results for input(s): LIPASE,  AMYLASE in the last 168 hours. No results for input(s): AMMONIA in the last 168 hours. Coagulation Profile: Recent Labs  Lab 11/02/19 1715 11/03/19 0405  INR 1.2 1.2   Cardiac Enzymes: No results for input(s): CKTOTAL, CKMB, CKMBINDEX, TROPONINI in the last 168 hours. BNP (last 3 results) No results for input(s): PROBNP in the last 8760 hours. HbA1C: No results for input(s): HGBA1C in the last 72 hours. CBG: Recent Labs  Lab 11/06/19 1642 11/06/19 2054 11/07/19  0539 11/07/19 1115 11/07/19 1628  GLUCAP 254* 254* 170* 190* 214*   Lipid Profile: No results for input(s): CHOL, HDL, LDLCALC, TRIG, CHOLHDL, LDLDIRECT in the last 72 hours. Thyroid Function Tests: No results for input(s): TSH, T4TOTAL, FREET4, T3FREE, THYROIDAB in the last 72 hours. Anemia Panel: No results for input(s): VITAMINB12, FOLATE, FERRITIN, TIBC, IRON, RETICCTPCT in the last 72 hours. Sepsis Labs: Recent Labs  Lab 11/02/19 1715 11/02/19 1901  LATICACIDVEN 1.2 1.4    Recent Results (from the past 240 hour(s))  SARS Coronavirus 2 by RT PCR (hospital order, performed in [facility] hospital lab) Nasopharyngeal Nasopharyngeal Swab     Status: None   Collection Time: 11/02/19  4:48 PM   Specimen: Nasopharyngeal Swab  Result Value Ref Range Status   SARS Coronavirus 2 NEGATIVE NEGATIVE Final    Comment: (NOTE) SARS-CoV-2 target nucleic acids are NOT DETECTED.  The SARS-CoV-2 RNA is generally detectable in upper and lower respiratory specimens during the acute phase of infection. The lowest concentration of SARS-CoV-2 viral copies this assay can detect is 250 copies / mL. A negative result does not preclude SARS-CoV-2 infection and should not be used as the sole basis for treatment or other patient management decisions.  A negative result may occur with improper specimen collection / handling, submission of specimen other than nasopharyngeal swab, presence of viral mutation(s) within the areas  targeted by this assay, and inadequate number of viral copies (<250 copies / mL). A negative result must be combined with clinical observations, patient history, and epidemiological information.  Fact Sheet for Patients:   StrictlyIdeas.no  Fact Sheet for Healthcare Providers: BankingDealers.co.za  This test is not yet approved or  cleared by the [place] FDA and has been authorized for detection and/or diagnosis of SARS-CoV-2 by FDA under an Emergency Use Authorization (EUA).  This EUA will remain in effect (meaning this test can be used) for the duration of the COVID-19 declaration under Section 564(b)(1) of the Act, 21 U.S.C. section 360bbb-3(b)(1), unless the authorization is terminated or revoked sooner.  Performed at [facility], [address] [place], [place] [address]   Blood Culture (routine x 2)     Status: None   Collection Time: 11/02/19  5:05 PM   Specimen: BLOOD RIGHT HAND  Result Value Ref Range Status   Specimen Description   Final    BLOOD RIGHT HAND BOTTLES DRAWN AEROBIC AND ANAEROBIC   Special Requests   Final    Blood Culture results may not be optimal due to an inadequate volume of blood received in culture bottles   Culture   Final    NO GROWTH 5 DAYS Performed at [facility], [address], [place], [place] [address]    Report Status 11/07/2019 FINAL  Final  Blood Culture (routine x 2)     Status: None   Collection Time: 11/02/19  5:15 PM   Specimen: Left Antecubital; Blood  Result Value Ref Range Status   Specimen Description   Final    LEFT ANTECUBITAL BOTTLES DRAWN AEROBIC AND ANAEROBIC   Special Requests Blood Culture adequate volume  Final   Culture   Final    NO GROWTH 5 DAYS Performed at [facility], [address], [place], [place] [address]    Report Status 11/07/2019 FINAL  Final  Surgical PCR screen     Status: Abnormal   Collection Time: 11/02/19 11:00 PM   Specimen: Nasal Mucosa;  Nasal Swab  Result Value Ref Range Status  MRSA, PCR NEGATIVE NEGATIVE Final   Staphylococcus aureus POSITIVE (A) NEGATIVE Final    Comment: RESULT CALLED TO, READ BACK BY AND VERIFIED WITH: BENGTSON,T AT 0745 ON 11/03/19 BY HUFFINES,S (NOTE) The Xpert SA Assay (FDA approved for NASAL specimens in patients 93 years of age and older), is one component of a comprehensive surveillance program. It is not intended to diagnose infection nor to guide or monitor treatment. Performed at [facility], [address] [place], [place] [address]          Radiology Studies: No results found.      Scheduled Meds: . (feeding supplement) PROSource Plus  30 mL Oral BID BM  . amLODipine  10 mg Oral Daily  . atorvastatin  10 mg Oral Daily  . carvedilol  12.5 mg Oral BID WC  . enoxaparin (LOVENOX) injection  40 mg Subcutaneous Q24H  . feeding supplement (ENSURE ENLIVE)  237 mL Oral BID BM  . insulin aspart  0-5 Units Subcutaneous QHS  . insulin aspart  0-9 Units Subcutaneous TID WC  . insulin glargine  30 Units Subcutaneous QHS  . lisinopril  40 mg Oral Daily  . multivitamin-lutein  1 capsule Oral Daily  . mupirocin ointment  1 application Nasal BID   Continuous Infusions:    LOS: 5 days    Time spent: 35 mins    Kathie Dike, MD [facility]   If 7PM-7AM, please contact night-coverage [email or web address]  11/07/2019, 7:53 PM

## 2019-11-07 NOTE — Progress Notes (Signed)
3 Days Post-Op  Subjective: Mild incisional pain. This occurs primarily during transfers.  Objective: Vital signs in last 24 hours: Temp:  [98.6 F (37 C)-99.9 F (37.7 C)] 99.9 F (37.7 C) (08/22 0546) Pulse Rate:  [78-96] 78 (08/22 0546) Resp:  [18] 18 (08/22 0546) BP: (128-168)/(77-92) 168/85 (08/22 0546) SpO2:  [87 %-98 %] 98 % (08/22 0546) Last BM Date: 11/03/19  Intake/Output from previous day: 08/21 0701 - 08/22 0700 In: 840 [P.O.:840] Out: 2500 [Urine:2500] Intake/Output this shift: No intake/output data recorded.  General appearance: alert, cooperative and no distress Extremities: Left BKA dressing dry and intact.  Lab Results:  Recent Labs    11/06/19 0606 11/07/19 0627  WBC 14.6* 15.4*  HGB 9.2* 8.9*  HCT 29.5* 28.3*  PLT 426* 440*   BMET Recent Labs    11/05/19 0633 11/06/19 0606  NA 139 136  K 4.4 3.7  CL 102 99  CO2 29 29  GLUCOSE 165* 140*  BUN 12 12  CREATININE 0.73 0.70  CALCIUM 8.2* 8.3*   PT/INR No results for input(s): LABPROT, INR in the last 72 hours.  Studies/Results: No results found.  Anti-infectives: Anti-infectives (From admission, onward)   Start     Dose/Rate Route Frequency Ordered Stop   11/03/19 0700  vancomycin (VANCOREADY) IVPB 750 mg/150 mL  Status:  Discontinued        750 mg 150 mL/hr over 60 Minutes Intravenous Every 12 hours 11/02/19 1843 11/05/19 1335   11/03/19 0600  piperacillin-tazobactam (ZOSYN) IVPB 3.375 g  Status:  Discontinued        3.375 g 12.5 mL/hr over 240 Minutes Intravenous Every 8 hours 11/02/19 2058 11/05/19 1335   11/02/19 2100  piperacillin-tazobactam (ZOSYN) IVPB 3.375 g        3.375 g 100 mL/hr over 30 Minutes Intravenous  Once 11/02/19 2058 11/02/19 2210   11/02/19 1715  vancomycin (VANCOCIN) IVPB 1000 mg/200 mL premix        1,000 mg 200 mL/hr over 60 Minutes Intravenous Every 1 hr x 2 11/02/19 1710 11/02/19 2059   11/02/19 1700  vancomycin (VANCOCIN) IVPB 1000 mg/200 mL premix   Status:  Discontinued        1,000 mg 200 mL/hr over 60 Minutes Intravenous  Once 11/02/19 1647 11/02/19 1710   11/02/19 1700  cefTRIAXone (ROCEPHIN) 2 g in sodium chloride 0.9 % 100 mL IVPB        2 g 200 mL/hr over 30 Minutes Intravenous  Once 11/02/19 1647 11/02/19 1804      Assessment/Plan: s/p Procedure(s): LEFT BELOW KNEE AMPUTATION Impression: Stable on postoperative day 3. Still has mild leukocytosis, but incision looks good and we will just monitor at this point. Awaiting skilled nursing placement.  LOS: 5 days    Aviva Signs 11/07/2019

## 2019-11-07 NOTE — TOC Progression Note (Signed)
Transition of Care [facility]) - Progression Note    Patient Details  Name: Yaviel Kloster MRN: [id number] Date of Birth: [birth year]  Transition of Care [facility]) CM/SW Contact  Shade Flood, LCSW Phone Number: 11/07/2019, 10:45 AM  Clinical Narrative:     TOC following. Spoke with Bryson Ha at [facility] today. The facility was not aware pt had selected them and auth has not been started yet. Asked Bryson Ha that if possible, they start the auth today, but otherwise, first thing Monday AM.  TOC will follow and continue to assist with dc planning needs.   Expected Discharge Plan: Skilled Nursing Facility Barriers to Discharge: Insurance Authorization  Expected Discharge Plan and Services Expected Discharge Plan: [place] In-house Referral: Clinical Social Work   Post Acute Care Choice: [place] Living arrangements for the past 2 months: Single Family Home                                       Social Determinants of Health (SDOH) Interventions    Readmission Risk Interventions No flowsheet data found.

## 2019-11-07 NOTE — Addendum Note (Signed)
Addendum  created 11/07/19 0953 by Denese Killings, MD   Clinical Note Signed, Intraprocedure Blocks edited, Pend clinical note

## 2019-11-08 ENCOUNTER — Inpatient Hospital Stay (HOSPITAL_COMMUNITY): Payer: Medicare HMO

## 2019-11-08 ENCOUNTER — Encounter (HOSPITAL_COMMUNITY): Payer: Self-pay | Admitting: General Surgery

## 2019-11-08 LAB — GLUCOSE, CAPILLARY
Glucose-Capillary: 179 mg/dL — ABNORMAL HIGH (ref 70–99)
Glucose-Capillary: 140 mg/dL — ABNORMAL HIGH (ref 70–99)
Glucose-Capillary: 190 mg/dL — ABNORMAL HIGH (ref 70–99)
Glucose-Capillary: 168 mg/dL — ABNORMAL HIGH (ref 70–99)
Glucose-Capillary: 182 mg/dL — ABNORMAL HIGH (ref 70–99)
Glucose-Capillary: 250 mg/dL — ABNORMAL HIGH (ref 70–99)

## 2019-11-08 LAB — CBC
HCT: 28.2 % — ABNORMAL LOW (ref 39.0–52.0)
Hemoglobin: 8.8 g/dL — ABNORMAL LOW (ref 13.0–17.0)
MCH: 28.8 pg (ref 26.0–34.0)
MCHC: 31.2 g/dL (ref 30.0–36.0)
MCV: 92.2 fL (ref 80.0–100.0)
Platelets: 483 10*3/uL — ABNORMAL HIGH (ref 150–400)
RBC: 3.06 MIL/uL — ABNORMAL LOW (ref 4.22–5.81)
RDW: 13.2 % (ref 11.5–15.5)
WBC: 14.4 10*3/uL — ABNORMAL HIGH (ref 4.0–10.5)
nRBC: 0 % (ref 0.0–0.2)

## 2019-11-08 LAB — SURGICAL PATHOLOGY

## 2019-11-08 LAB — SARS CORONAVIRUS 2 BY RT PCR (HOSPITAL ORDER, PERFORMED IN ~~LOC~~ HOSPITAL LAB): SARS Coronavirus 2: NEGATIVE

## 2019-11-08 MED ORDER — POLYETHYLENE GLYCOL 3350 17 G PO PACK
17.0000 g | PACK | Freq: Two times a day (BID) | ORAL | Status: DC
  Administered 2019-11-08 – 2019-11-09 (×2): 17 g via ORAL
  Filled 2019-11-08 (×2): qty 1

## 2019-11-08 MED ORDER — MORPHINE SULFATE (PF) 2 MG/ML IV SOLN: 2.0000 mg | INTRAVENOUS | Status: DC | PRN

## 2019-11-08 NOTE — Progress Notes (Signed)
PROGRESS NOTE    Alfred Miller  MVH:[id number] DOB: 05/29/1950 DOA: 11/02/2019 PCP: Glenda Chroman, MD    Brief Narrative:  HPI: Alfred Miller is a 69 y.o. male with medical history significant for type 2 diabetes mellitus, essential hypertension, CAD who presents to the emergency department due to worsening left foot wound.  Patient states that he accidentally stepped on a glass over a week ago and sustained a wound of left foot which has been worsening since onset.  He has since seen his PCP after sustaining the wound, but there was no antibiotics required at that time per patient.  Over the past 2 days, he noted discoloration to the left toes and has since developed subjective fever and chills, patient also noted a foul odor from the wound which prompted him to go to the ED for further evaluation.  He denies nausea, vomiting, headache, blurry vision, numbness or tingling.  ED Course:  In the emergency department, he was hemodynamically stable.  Work-up in the ED showed leukocytosis with a left shift.  BUN/creatinine 25/1.31 (no prior labs for comparison).  Albumin 3.3, AST 42, ALT 50.  Left foot x-ray showed mild focal dorsal soft tissue swelling with no radiopaque soft tissue foreign bodies identified. He was started on IV Vancomycin and ceftriaxone.   Assessment & Plan:   Principal Problem:   Wound, open, foot, left, initial encounter Active Problems:   Hypertension   Cellulitis and abscess of toe of left foot   Type 2 diabetes mellitus with foot ulcer (HCC)   Hyperlipidemia   AKI (acute kidney injury) ([place])   Prolonged QT interval   Obesity (BMI 30.0-34.9)   Gangrene of left foot ([place])   1. Left foot gangrene.  Seen by general surgery.  Currently on IV antibiotics.  Arterial Doppler studies done show ABI of 0.6 on the left side indicating peripheral vascular disease.  Case reviewed with Dr. Donnetta Hutching on-call for vascular surgery.  It was felt that if patient's foot is not salvageable and  he needs below the knee amputation, he would not be a candidate for revascularization at this time.  Discussed with Dr. Arnoldo Morale and patient underwent left below the knee amputation on 8/19.  Further postoperative management per surgery. 2. Insulin-dependent diabetes, uncontrolled with hyperglycemia.  He is chronically on glipizide and Novolin N.  Currently on Lantus while in the hospital and on sliding scale insulin.  Blood sugars have currently been stable 3. Hypertension.  Continued on Coreg and amlodipine.  Blood pressure currently stable. 4. Acute kidney injury.  Improved with IV fluids.  Secondary to #1. 5. Hyperlipidemia.  Continue statin 6. Anemia. likely related to hemodilution as well as acute blood loss from surgery.  Continue to follow hemoglobin.   DVT prophylaxis: enoxaparin (LOVENOX) injection 40 mg Start: 11/03/19 2030 SCDs Start: 11/02/19 1928  Code Status: full code Family Communication: Discussed with patient, left voicemail for wife. Disposition Plan: Status is: Inpatient  Remains inpatient appropriate because:Inpatient level of care appropriate due to severity of illness   Dispo: The patient is from: Home              Anticipated d/c is to: SNF              Anticipated d/c date is: 1 day              Patient currently is medically stable to d/c.       Consultants:   General surgery  Procedures:  Antimicrobials:   Zosyn 8/17 >8/20  Vancomycin 8/17 >8/20   Subjective: No new complaints.  He has not had a bowel movement.  Objective: Vitals:   11/07/19 1309 11/07/19 2018 11/08/19 0418 11/08/19 1402  BP: (!) 160/73 140/75 140/79 115/61  Pulse: 74 78 69 67  Resp: 19 20 17 18   Temp: 99.1 F (37.3 C) (!) 100.5 F (38.1 C) 98.8 F (37.1 C) 98.8 F (37.1 C)  TempSrc: Oral  Oral Oral  SpO2: 94% 95% 95% 94%  Weight:      Height:        Intake/Output Summary (Last 24 hours) at 11/08/2019 1958 Last data filed at 11/08/2019 0800 Gross per 24 hour    Intake --  Output 1000 ml  Net -1000 ml   Filed Weights   11/02/19 1518 11/02/19 2239  Weight: 113.4 kg 110.5 kg    Examination: General exam: Alert, awake, oriented x 3 Respiratory system: Clear to auscultation. Respiratory effort normal. Cardiovascular system:RRR. No murmurs, rubs, gallops. Gastrointestinal system: Abdomen is nondistended, soft and nontender. No organomegaly or masses felt. Normal bowel sounds heard. Central nervous system: Alert and oriented. No focal neurological deficits. Extremities: left bka Skin: No rashes, lesions or ulcers Psychiatry: Judgement and insight appear normal. Mood & affect appropriate.     Data Reviewed: I have personally reviewed following labs and imaging studies  CBC: Recent Labs  Lab 11/02/19 1715 11/03/19 0405 11/04/19 0605 11/04/19 2111 11/06/19 0606 11/07/19 0627 11/08/19 0522  WBC 21.0*   < > 15.5* 14.8* 14.6* 15.4* 14.4*  NEUTROABS 16.1*  --   --   --   --   --   --   HGB 12.1*   < > 10.7* 9.8* 9.2* 8.9* 8.8*  HCT 37.3*   < > 33.5* 32.0* 29.5* 28.3* 28.2*  MCV 91.0   < > 93.1 95.5 93.9 92.8 92.2  PLT 531*   < > 447* 416* 426* 440* 483*   < > = values in this interval not displayed.   Basic Metabolic Panel: Recent Labs  Lab 11/02/19 1715 11/03/19 0405 11/04/19 0605 11/05/19 0633 11/06/19 0606  NA 137 138 139 139 136  K 3.9 3.6 3.6 4.4 3.7  CL 96* 100 100 102 99  CO2 29 27 28 29 29   GLUCOSE 118* 114* 214* 165* 140*  BUN 25* 23 17 12 12   CREATININE 1.31* 1.03 0.79 0.73 0.70  CALCIUM 8.9 8.7* 8.7* 8.2* 8.3*  MG  --  2.3  --   --   --   PHOS  --  4.0  --   --   --    GFR: Estimated Creatinine Clearance: 115.3 mL/min (by C-G formula based on SCr of 0.7 mg/dL). Liver Function Tests: Recent Labs  Lab 11/02/19 1715 11/03/19 0405  AST 42* 30  ALT 50* 39  ALKPHOS 70 65  BILITOT 0.6 0.5  PROT 8.9* 7.7  ALBUMIN 3.3* 2.9*   No results for input(s): LIPASE, AMYLASE in the last 168 hours. No results for  input(s): AMMONIA in the last 168 hours. Coagulation Profile: Recent Labs  Lab 11/02/19 1715 11/03/19 0405  INR 1.2 1.2   Cardiac Enzymes: No results for input(s): CKTOTAL, CKMB, CKMBINDEX, TROPONINI in the last 168 hours. BNP (last 3 results) No results for input(s): PROBNP in the last 8760 hours. HbA1C: No results for input(s): HGBA1C in the last 72 hours. CBG: Recent Labs  Lab 11/07/19 1628 11/07/19 2015 11/08/19 0735 11/08/19 1155 11/08/19 1645  GLUCAP 214* 232* 168* 182* 250*   Lipid Profile: No results for input(s): CHOL, HDL, LDLCALC, TRIG, CHOLHDL, LDLDIRECT in the last 72 hours. Thyroid Function Tests: No results for input(s): TSH, T4TOTAL, FREET4, T3FREE, THYROIDAB in the last 72 hours. Anemia Panel: No results for input(s): VITAMINB12, FOLATE, FERRITIN, TIBC, IRON, RETICCTPCT in the last 72 hours. Sepsis Labs: Recent Labs  Lab 11/02/19 1715 11/02/19 1901  LATICACIDVEN 1.2 1.4    Recent Results (from the past 240 hour(s))  SARS Coronavirus 2 by RT PCR (hospital order, performed in [facility] hospital lab) Nasopharyngeal Nasopharyngeal Swab     Status: None   Collection Time: 11/02/19  4:48 PM   Specimen: Nasopharyngeal Swab  Result Value Ref Range Status   SARS Coronavirus 2 NEGATIVE NEGATIVE Final    Comment: (NOTE) SARS-CoV-2 target nucleic acids are NOT DETECTED.  The SARS-CoV-2 RNA is generally detectable in upper and lower respiratory specimens during the acute phase of infection. The lowest concentration of SARS-CoV-2 viral copies this assay can detect is 250 copies / mL. A negative result does not preclude SARS-CoV-2 infection and should not be used as the sole basis for treatment or other patient management decisions.  A negative result may occur with improper specimen collection / handling, submission of specimen other than nasopharyngeal swab, presence of viral mutation(s) within the areas targeted by this assay, and inadequate number of  viral copies (<250 copies / mL). A negative result must be combined with clinical observations, patient history, and epidemiological information.  Fact Sheet for Patients:   StrictlyIdeas.no  Fact Sheet for Healthcare Providers: BankingDealers.co.za  This test is not yet approved or  cleared by the [place] FDA and has been authorized for detection and/or diagnosis of SARS-CoV-2 by FDA under an Emergency Use Authorization (EUA).  This EUA will remain in effect (meaning this test can be used) for the duration of the COVID-19 declaration under Section 564(b)(1) of the Act, 21 U.S.C. section 360bbb-3(b)(1), unless the authorization is terminated or revoked sooner.  Performed at [facility], [address], [place], [place] [address]   Blood Culture (routine x 2)     Status: None   Collection Time: 11/02/19  5:05 PM   Specimen: BLOOD RIGHT HAND  Result Value Ref Range Status   Specimen Description   Final    BLOOD RIGHT HAND BOTTLES DRAWN AEROBIC AND ANAEROBIC   Special Requests   Final    Blood Culture results may not be optimal due to an inadequate volume of blood received in culture bottles   Culture   Final    NO GROWTH 5 DAYS Performed at [facility], [address], [place], [place] [address]    Report Status 11/07/2019 FINAL  Final  Blood Culture (routine x 2)     Status: None   Collection Time: 11/02/19  5:15 PM   Specimen: Left Antecubital; Blood  Result Value Ref Range Status   Specimen Description   Final    LEFT ANTECUBITAL BOTTLES DRAWN AEROBIC AND ANAEROBIC   Special Requests Blood Culture adequate volume  Final   Culture   Final    NO GROWTH 5 DAYS Performed at [facility], [address], [place], [place] [address]    Report Status 11/07/2019 FINAL  Final  Surgical PCR screen     Status: Abnormal   Collection Time: 11/02/19 11:00 PM   Specimen: Nasal Mucosa; Nasal Swab  Result Value Ref Range Status    MRSA, PCR NEGATIVE NEGATIVE Final  Staphylococcus aureus POSITIVE (A) NEGATIVE Final    Comment: RESULT CALLED TO, READ BACK BY AND VERIFIED WITH: BENGTSON,T AT 0745 ON 11/03/19 BY HUFFINES,S (NOTE) The Xpert SA Assay (FDA approved for NASAL specimens in patients 8 years of age and older), is one component of a comprehensive surveillance program. It is not intended to diagnose infection nor to guide or monitor treatment. Performed at [facility], [address], [place], [place] [address]   SARS Coronavirus 2 by RT PCR (hospital order, performed in [facility] hospital lab) Nasopharyngeal Nasopharyngeal Swab     Status: None   Collection Time: 11/08/19  3:14 PM   Specimen: Nasopharyngeal Swab  Result Value Ref Range Status   SARS Coronavirus 2 NEGATIVE NEGATIVE Final    Comment: (NOTE) SARS-CoV-2 target nucleic acids are NOT DETECTED.  The SARS-CoV-2 RNA is generally detectable in upper and lower respiratory specimens during the acute phase of infection. The lowest concentration of SARS-CoV-2 viral copies this assay can detect is 250 copies / mL. A negative result does not preclude SARS-CoV-2 infection and should not be used as the sole basis for treatment or other patient management decisions.  A negative result may occur with improper specimen collection / handling, submission of specimen other than nasopharyngeal swab, presence of viral mutation(s) within the areas targeted by this assay, and inadequate number of viral copies (<250 copies / mL). A negative result must be combined with clinical observations, patient history, and epidemiological information.  Fact Sheet for Patients:   StrictlyIdeas.no  Fact Sheet for Healthcare Providers: BankingDealers.co.za  This test is not yet approved or  cleared by the [place] FDA and has been authorized for detection and/or diagnosis of SARS-CoV-2 by FDA under an Emergency Use  Authorization (EUA).  This EUA will remain in effect (meaning this test can be used) for the duration of the COVID-19 declaration under Section 564(b)(1) of the Act, 21 U.S.C. section 360bbb-3(b)(1), unless the authorization is terminated or revoked sooner.  Performed at [facility], [address], [place], [place] [address]          Radiology Studies: No results found.      Scheduled Meds:  (feeding supplement) PROSource Plus  30 mL Oral BID BM   amLODipine  10 mg Oral Daily   atorvastatin  10 mg Oral Daily   carvedilol  12.5 mg Oral BID WC   enoxaparin (LOVENOX) injection  40 mg Subcutaneous Q24H   feeding supplement (ENSURE ENLIVE)  237 mL Oral BID BM   insulin aspart  0-5 Units Subcutaneous QHS   insulin aspart  0-9 Units Subcutaneous TID WC   insulin glargine  30 Units Subcutaneous QHS   lisinopril  40 mg Oral Daily   multivitamin-lutein  1 capsule Oral Daily   Continuous Infusions:    LOS: 6 days    Time spent: 35 mins    Kathie Dike, MD [facility]   If 7PM-7AM, please contact night-coverage [email or web address]  11/08/2019, 7:58 PM

## 2019-11-08 NOTE — Progress Notes (Signed)
4 Days Post-Op  Subjective: No new complaints  Objective: Vital signs in last 24 hours: Temp:  [98.8 F (37.1 C)-100.5 F (38.1 C)] 98.8 F (37.1 C) (08/23 1402) Pulse Rate:  [67-78] 67 (08/23 1402) Resp:  [17-20] 18 (08/23 1402) BP: (115-140)/(61-79) 115/61 (08/23 1402) SpO2:  [94 %-95 %] 94 % (08/23 1402) Last BM Date: 11/08/19  Intake/Output from previous day: 08/22 0701 - 08/23 0700 In: 480 [P.O.:480] Out: 1750 [Urine:1750] Intake/Output this shift: Total I/O In: -  Out: 500 [Urine:500]  General appearance: alert, cooperative and no distress Extremities: Left BKA dressing intact without drainage.  Lab Results:  Recent Labs    11/07/19 0627 11/08/19 0522  WBC 15.4* 14.4*  HGB 8.9* 8.8*  HCT 28.3* 28.2*  PLT 440* 483*   BMET Recent Labs    11/06/19 0606  NA 136  K 3.7  CL 99  CO2 29  GLUCOSE 140*  BUN 12  CREATININE 0.70  CALCIUM 8.3*   PT/INR No results for input(s): LABPROT, INR in the last 72 hours.  Studies/Results: No results found.  Anti-infectives: Anti-infectives (From admission, onward)   Start     Dose/Rate Route Frequency Ordered Stop   11/03/19 0700  vancomycin (VANCOREADY) IVPB 750 mg/150 mL  Status:  Discontinued        750 mg 150 mL/hr over 60 Minutes Intravenous Every 12 hours 11/02/19 1843 11/05/19 1335   11/03/19 0600  piperacillin-tazobactam (ZOSYN) IVPB 3.375 g  Status:  Discontinued        3.375 g 12.5 mL/hr over 240 Minutes Intravenous Every 8 hours 11/02/19 2058 11/05/19 1335   11/02/19 2100  piperacillin-tazobactam (ZOSYN) IVPB 3.375 g        3.375 g 100 mL/hr over 30 Minutes Intravenous  Once 11/02/19 2058 11/02/19 2210   11/02/19 1715  vancomycin (VANCOCIN) IVPB 1000 mg/200 mL premix        1,000 mg 200 mL/hr over 60 Minutes Intravenous Every 1 hr x 2 11/02/19 1710 11/02/19 2059   11/02/19 1700  vancomycin (VANCOCIN) IVPB 1000 mg/200 mL premix  Status:  Discontinued        1,000 mg 200 mL/hr over 60 Minutes  Intravenous  Once 11/02/19 1647 11/02/19 1710   11/02/19 1700  cefTRIAXone (ROCEPHIN) 2 g in sodium chloride 0.9 % 100 mL IVPB        2 g 200 mL/hr over 30 Minutes Intravenous  Once 11/02/19 1647 11/02/19 1804      Assessment/Plan: s/p Procedure(s): LEFT BELOW KNEE AMPUTATION Imp: Stable.  Awaiting skilled nursing placement.  LOS: 6 days    Aviva Signs 11/08/2019

## 2019-11-09 DIAGNOSIS — E669 Obesity, unspecified: Secondary | ICD-10-CM | POA: Diagnosis not present

## 2019-11-09 DIAGNOSIS — I700 Atherosclerosis of aorta: Secondary | ICD-10-CM | POA: Diagnosis not present

## 2019-11-09 DIAGNOSIS — E11621 Type 2 diabetes mellitus with foot ulcer: Secondary | ICD-10-CM | POA: Diagnosis not present

## 2019-11-09 DIAGNOSIS — L97509 Non-pressure chronic ulcer of other part of unspecified foot with unspecified severity: Secondary | ICD-10-CM | POA: Diagnosis not present

## 2019-11-09 DIAGNOSIS — S2020XD Contusion of thorax, unspecified, subsequent encounter: Secondary | ICD-10-CM | POA: Diagnosis not present

## 2019-11-09 DIAGNOSIS — E785 Hyperlipidemia, unspecified: Secondary | ICD-10-CM | POA: Diagnosis not present

## 2019-11-09 DIAGNOSIS — E119 Type 2 diabetes mellitus without complications: Secondary | ICD-10-CM | POA: Diagnosis not present

## 2019-11-09 DIAGNOSIS — Z89512 Acquired absence of left leg below knee: Secondary | ICD-10-CM | POA: Diagnosis not present

## 2019-11-09 DIAGNOSIS — S20214A Contusion of middle front wall of thorax, initial encounter: Secondary | ICD-10-CM | POA: Diagnosis not present

## 2019-11-09 DIAGNOSIS — R21 Rash and other nonspecific skin eruption: Secondary | ICD-10-CM | POA: Diagnosis not present

## 2019-11-09 DIAGNOSIS — I96 Gangrene, not elsewhere classified: Secondary | ICD-10-CM | POA: Diagnosis not present

## 2019-11-09 DIAGNOSIS — R079 Chest pain, unspecified: Secondary | ICD-10-CM | POA: Diagnosis not present

## 2019-11-09 DIAGNOSIS — I2510 Atherosclerotic heart disease of native coronary artery without angina pectoris: Secondary | ICD-10-CM | POA: Diagnosis not present

## 2019-11-09 DIAGNOSIS — Z743 Need for continuous supervision: Secondary | ICD-10-CM | POA: Diagnosis not present

## 2019-11-09 DIAGNOSIS — Z20822 Contact with and (suspected) exposure to covid-19: Secondary | ICD-10-CM | POA: Diagnosis not present

## 2019-11-09 DIAGNOSIS — J9811 Atelectasis: Secondary | ICD-10-CM | POA: Diagnosis not present

## 2019-11-09 DIAGNOSIS — I10 Essential (primary) hypertension: Secondary | ICD-10-CM | POA: Diagnosis not present

## 2019-11-09 DIAGNOSIS — X58XXXA Exposure to other specified factors, initial encounter: Secondary | ICD-10-CM | POA: Diagnosis not present

## 2019-11-09 DIAGNOSIS — Z794 Long term (current) use of insulin: Secondary | ICD-10-CM | POA: Diagnosis not present

## 2019-11-09 DIAGNOSIS — M6281 Muscle weakness (generalized): Secondary | ICD-10-CM | POA: Diagnosis not present

## 2019-11-09 DIAGNOSIS — R110 Nausea: Secondary | ICD-10-CM | POA: Diagnosis not present

## 2019-11-09 DIAGNOSIS — Z4781 Encounter for orthopedic aftercare following surgical amputation: Secondary | ICD-10-CM | POA: Diagnosis not present

## 2019-11-09 DIAGNOSIS — E1169 Type 2 diabetes mellitus with other specified complication: Secondary | ICD-10-CM | POA: Diagnosis not present

## 2019-11-09 DIAGNOSIS — J90 Pleural effusion, not elsewhere classified: Secondary | ICD-10-CM | POA: Diagnosis not present

## 2019-11-09 DIAGNOSIS — W050XXD Fall from non-moving wheelchair, subsequent encounter: Secondary | ICD-10-CM | POA: Diagnosis not present

## 2019-11-09 DIAGNOSIS — I712 Thoracic aortic aneurysm, without rupture: Secondary | ICD-10-CM | POA: Diagnosis not present

## 2019-11-09 DIAGNOSIS — E1151 Type 2 diabetes mellitus with diabetic peripheral angiopathy without gangrene: Secondary | ICD-10-CM | POA: Diagnosis not present

## 2019-11-09 DIAGNOSIS — R4182 Altered mental status, unspecified: Secondary | ICD-10-CM | POA: Diagnosis not present

## 2019-11-09 DIAGNOSIS — N179 Acute kidney failure, unspecified: Secondary | ICD-10-CM | POA: Diagnosis not present

## 2019-11-09 DIAGNOSIS — Z7401 Bed confinement status: Secondary | ICD-10-CM | POA: Diagnosis not present

## 2019-11-09 DIAGNOSIS — E1140 Type 2 diabetes mellitus with diabetic neuropathy, unspecified: Secondary | ICD-10-CM | POA: Diagnosis not present

## 2019-11-09 DIAGNOSIS — R61 Generalized hyperhidrosis: Secondary | ICD-10-CM | POA: Diagnosis not present

## 2019-11-09 DIAGNOSIS — R262 Difficulty in walking, not elsewhere classified: Secondary | ICD-10-CM | POA: Diagnosis not present

## 2019-11-09 DIAGNOSIS — S91302A Unspecified open wound, left foot, initial encounter: Secondary | ICD-10-CM | POA: Diagnosis not present

## 2019-11-09 DIAGNOSIS — R0602 Shortness of breath: Secondary | ICD-10-CM | POA: Diagnosis not present

## 2019-11-09 DIAGNOSIS — Z888 Allergy status to other drugs, medicaments and biological substances status: Secondary | ICD-10-CM | POA: Diagnosis not present

## 2019-11-09 LAB — GLUCOSE, CAPILLARY
Glucose-Capillary: 261 mg/dL — ABNORMAL HIGH (ref 70–99)
Glucose-Capillary: 163 mg/dL — ABNORMAL HIGH (ref 70–99)
Glucose-Capillary: 246 mg/dL — ABNORMAL HIGH (ref 70–99)

## 2019-11-09 MED ORDER — OXYCODONE-ACETAMINOPHEN 5-325 MG PO TABS
1.0000 | ORAL_TABLET | ORAL | 0 refills | Status: DC | PRN
Start: 2019-11-09 — End: 2023-04-17

## 2019-11-09 MED ORDER — NOVOLIN N RELION 100 UNIT/ML ~~LOC~~ SUSP: 40.0000 [IU] | Freq: Two times a day (BID) | SUBCUTANEOUS | 11 refills | Status: DC

## 2019-11-09 MED ORDER — POLYETHYLENE GLYCOL 3350 17 G PO PACK: 17.0000 g | PACK | Freq: Two times a day (BID) | ORAL | 0 refills | Status: DC

## 2019-11-09 MED ORDER — SORBITOL 70 % SOLN
960.0000 mL | TOPICAL_OIL | Freq: Once | ORAL | Status: DC
  Filled 2019-11-09: qty 473

## 2019-11-09 MED ORDER — ATORVASTATIN CALCIUM 10 MG PO TABS: 10.0000 mg | ORAL_TABLET | Freq: Every day | ORAL | Status: AC

## 2019-11-09 NOTE — Discharge Instructions (Signed)
Needs a stump shrinker applied.

## 2019-11-09 NOTE — Progress Notes (Addendum)
5 Days Post-Op  Subjective: Patient has moderate incisional pain.  Objective: Vital signs in last 24 hours: Temp:  [98.6 F (37 C)-99.4 F (37.4 C)] 99.4 F (37.4 C) (08/24 0505) Pulse Rate:  [65-71] 71 (08/24 0505) Resp:  [18-24] 24 (08/24 0505) BP: (115-146)/(61-77) 146/77 (08/24 0505) SpO2:  [93 %-94 %] 93 % (08/24 0505) Last BM Date: 11/08/19  Intake/Output from previous day: 08/23 0701 - 08/24 0700 In: -  Out: 1250 [Urine:1250] Intake/Output this shift: Total I/O In: -  Out: 300 [Urine:300]  General appearance: alert, cooperative and no distress Extremities: Left BKA incision healing well. No hematoma or drainage present. Minimal swelling present.  Lab Results:  Recent Labs    11/07/19 0627 11/08/19 0522  WBC 15.4* 14.4*  HGB 8.9* 8.8*  HCT 28.3* 28.2*  PLT 440* 483*   BMET No results for input(s): NA, K, CL, CO2, GLUCOSE, BUN, CREATININE, CALCIUM in the last 72 hours. PT/INR No results for input(s): LABPROT, INR in the last 72 hours.  Studies/Results: DG CHEST PORT 1 VIEW  Result Date: 11/08/2019 CLINICAL DATA:  Fever and leukocytosis EXAM: PORTABLE CHEST 1 VIEW COMPARISON:  11/02/2019 FINDINGS: The heart size and mediastinal contours are within normal limits. Both lungs are clear. The visualized skeletal structures are unremarkable. IMPRESSION: No active disease. Electronically Signed   By: Fidela Salisbury MD   On: 11/08/2019 21:23    Anti-infectives: Anti-infectives (From admission, onward)   Start     Dose/Rate Route Frequency Ordered Stop   11/03/19 0700  vancomycin (VANCOREADY) IVPB 750 mg/150 mL  Status:  Discontinued        750 mg 150 mL/hr over 60 Minutes Intravenous Every 12 hours 11/02/19 1843 11/05/19 1335   11/03/19 0600  piperacillin-tazobactam (ZOSYN) IVPB 3.375 g  Status:  Discontinued        3.375 g 12.5 mL/hr over 240 Minutes Intravenous Every 8 hours 11/02/19 2058 11/05/19 1335   11/02/19 2100  piperacillin-tazobactam (ZOSYN) IVPB 3.375  g        3.375 g 100 mL/hr over 30 Minutes Intravenous  Once 11/02/19 2058 11/02/19 2210   11/02/19 1715  vancomycin (VANCOCIN) IVPB 1000 mg/200 mL premix        1,000 mg 200 mL/hr over 60 Minutes Intravenous Every 1 hr x 2 11/02/19 1710 11/02/19 2059   11/02/19 1700  vancomycin (VANCOCIN) IVPB 1000 mg/200 mL premix  Status:  Discontinued        1,000 mg 200 mL/hr over 60 Minutes Intravenous  Once 11/02/19 1647 11/02/19 1710   11/02/19 1700  cefTRIAXone (ROCEPHIN) 2 g in sodium chloride 0.9 % 100 mL IVPB        2 g 200 mL/hr over 30 Minutes Intravenous  Once 11/02/19 1647 11/02/19 1804      Assessment/Plan: s/p Procedure(s): LEFT BELOW KNEE AMPUTATION Impression: Patient healing well. My understanding is that he is going to [facility] today. Staples should be removed on 11/25/2019. I would be more than happy to see the patient in my office on that day.  Please apply stump sock/shrinker to left BKA.  LOS: 7 days    Aviva Signs 11/09/2019

## 2019-11-09 NOTE — TOC Transition Note (Signed)
Transition of Care [facility]) - CM/SW Discharge Note   Patient Details  Name: Alfred Miller MRN: [id number] Date of Birth: Oct 04, 1950  [facility] CM/SW Contact:  Iona Beard, [place] Phone Number: 11/09/2019, 11:39 AM   Clinical Narrative:    Pt medically ready to go to [facility]. Clinicals have been sent in the hub. TOC left wife a message. RN to call report. Medical necessity printed. TOC will schedule EMS when RN is ready.   Final next level of care: Skilled Nursing Facility Barriers to Discharge: Barriers Resolved   Patient Goals and CMS Choice Patient states their goals for this hospitalization and ongoing recovery are:: To go to SNF then return home. CMS Medicare.gov Compare Post Acute Care list provided to:: Patient Choice offered to / list presented to : Patient  Discharge Placement                Patient to be transferred to facility by: EMS Name of family member notified: Drae Mitzel, left message Patient and family notified of of transfer: 11/09/19  Discharge Plan and Services In-house Referral: Clinical Social Work   Post Acute Care Choice: [place]                               Social Determinants of Health (SDOH) Interventions     Readmission Risk Interventions No flowsheet data found.

## 2019-11-09 NOTE — Progress Notes (Signed)
Report called to [facility] 

## 2019-11-09 NOTE — Progress Notes (Signed)
Physical Therapy Treatment Patient Details Name: Alfred Miller MRN: [id number] DOB: [birth year] Today's Date: 11/09/2019    History of Present Illness Alfred Miller is a 69 y.o. male with medical history significant for type 2 diabetes mellitus, essential hypertension, CAD who presents to the emergency department due to worsening left foot wound.  Patient states that he accidentally stepped on a glass over a week ago and sustained a wound of left foot which has been worsening since onset.  He has since seen his PCP after sustaining the wound, but there was no antibiotics required at that time per patient.  Over the past 2 days, he noted discoloration to the left toes and has since developed subjective fever and chills, patient also noted a foul odor from the wound which prompted him to go to the ED for further evaluation.  He denies nausea, vomiting, headache, blurry vision, numbness or tingling.    PT Comments    Session spent on therapeutic exercise. Educated pt on quad activation through exercises with good contraction and understanding. Pt began to come to sitting EOB, but then stopped and returned to supine due to "I feel like I need to have a bowel movement". Pt requests to call nursing for bed pan. Patient will benefit from continued physical therapy in hospital and recommendations below to increase strength, balance, endurance for safe ADLs and gait.   Follow Up Recommendations  SNF     Equipment Recommendations  None recommended by PT    Recommendations for Other Services       Precautions / Restrictions Precautions Precautions: Fall Precaution Booklet Issued: No Precaution Comments: L BKA Restrictions Weight Bearing Restrictions: No    Mobility  Bed Mobility  General bed mobility comments: pt declines due to feeling like he needs to have a bowel movement  Transfers     Ambulation/Gait        Stairs             Wheelchair Mobility    Modified Rankin (Stroke  Patients Only)       Balance           Cognition Arousal/Alertness: Awake/alert Behavior During Therapy: WFL for tasks assessed/performed Overall Cognitive Status: Within Functional Limits for tasks assessed     Exercises General Exercises - Lower Extremity Quad Sets: AROM;Strengthening;Left;10 reps;Supine Short Arc Quad: AROM;Strengthening;Left;10 reps;Supine Hip ABduction/ADduction: AROM;Strengthening;Left;10 reps;Supine Straight Leg Raises: AROM;Strengthening;Left;10 reps;Supine    General Comments        Pertinent Vitals/Pain Pain Assessment: 0-10 Pain Score: 5  Pain Location: L knee Pain Descriptors / Indicators: Aching;Discomfort;Sharp Pain Intervention(s): Limited activity within patient's tolerance;Monitored during session;Repositioned    Home Living                      Prior Function            PT Goals (current goals can now be found in the care plan section) Acute Rehab PT Goals Patient Stated Goal: go to SNF PT Goal Formulation: With patient Time For Goal Achievement: 11/19/19 Potential to Achieve Goals: Good Progress towards PT goals: Progressing toward goals    Frequency    Min 3X/week      PT Plan Current plan remains appropriate    Co-evaluation              AM-PAC PT "6 Clicks" Mobility   Outcome Measure  Help needed turning from your back to your side while in a flat bed without using bedrails?: A  Little Help needed moving from lying on your back to sitting on the side of a flat bed without using bedrails?: A Lot Help needed moving to and from a bed to a chair (including a wheelchair)?: Total Help needed standing up from a chair using your arms (e.g., wheelchair or bedside chair)?: A Lot Help needed to walk in hospital room?: Total Help needed climbing 3-5 steps with a railing? : Total 6 Click Score: 10    End of Session   Activity Tolerance: Patient tolerated treatment well Patient left: in bed;with call  bell/phone within reach;with bed alarm set Nurse Communication: Mobility status PT Visit Diagnosis: Unsteadiness on feet (R26.81);Other abnormalities of gait and mobility (R26.89);Muscle weakness (generalized) (M62.81);Other (comment) (L BKA)     Time: 8119-1478 PT Time Calculation (min) (ACUTE ONLY): 15 min  Charges:  $Therapeutic Exercise: 8-22 mins                      Alfred Miller PT, DPT 11/09/19, 10:16 AM [phone]

## 2019-11-09 NOTE — Discharge Summary (Signed)
Physician Discharge Summary  Alfred Miller WUJ:[id number] DOB: [birth year] DOA: 11/02/2019  PCP: Glenda Chroman, MD  Admit date: 11/02/2019 Discharge date: 11/09/2019  Admitted From: Home Disposition: Skilled nursing facility  Recommendations for Outpatient Follow-up:  1. Follow up with PCP in 1-2 weeks 2. Please obtain BMP/CBC in one week 3. Remove staples on 11/25/2019 4. Follow-up with general surgery on 11/25/2019   Discharge Condition: Stable CODE STATUS: Full code Diet recommendation: Heart healthy, carb modified  Brief/Interim Summary: NFA:OZHYQM Wareis a 69 y.o.malewith medical history significant fortype 2 diabetes mellitus, essential hypertension, CAD who presents to the emergency department due to worsening left foot wound. Patient states that he accidentally stepped on a glass over a week ago and sustained a wound of left foot which has been worsening since onset. He has since seen his PCP after sustaining the wound, but there was no antibiotics required at that time per patient. Over the past 2 days, he noted discoloration to the left toes and has since developed subjective fever and chills, patient also noted a foul odor from the wound which prompted him to go to the ED for further evaluation. He denies nausea, vomiting, headache, blurry vision, numbness or tingling.  ED Course: In the emergency department, he was hemodynamically stable. Work-up in the ED showed leukocytosis with a left shift. BUN/creatinine 25/1.31 (no prior labs for comparison). Albumin 3.3, AST 42, ALT 50. Left foot x-ray showed mild focal dorsal soft tissue swelling with no radiopaque soft tissue foreign bodies identified.He was started on IV Vancomycinand ceftriaxone.  Discharge Diagnoses:  Principal Problem:   Wound, open, foot, left, initial encounter Active Problems:   Hypertension   Cellulitis and abscess of toe of left foot   Type 2 diabetes mellitus with foot ulcer (HCC)    Hyperlipidemia   AKI (acute kidney injury) ([place])   Prolonged QT interval   Obesity (BMI 30.0-34.9)   Gangrene of left foot ([place])  1. Left foot gangrene.  Seen by general surgery.  Currently on IV antibiotics.  Arterial Doppler studies done show ABI of 0.6 on the left side indicating peripheral vascular disease.  Case reviewed with Dr. Donnetta Hutching on-call for vascular surgery.  It was felt that if patient's foot is not salvageable and he needs below the knee amputation, then he would not be a candidate for revascularization at this time.  Discussed with Dr. Arnoldo Morale and patient underwent left below the knee amputation on 8/19.  Postoperative course was unremarkable.  He will need follow-up with general surgery on 9/9 for removal of staples. 2. Insulin-dependent diabetes, uncontrolled with hyperglycemia.  He is chronically on glipizide and Novolin N.  Currently on Lantus while in the hospital and on sliding scale insulin.  Blood sugars have currently been stable.  Will transition back to Novolin N at reduced dose on discharge. 3. Hypertension.  Continued on Coreg and amlodipine.  Blood pressure currently stable. 4. Acute kidney injury.  Improved with IV fluids.  Secondary to #1. 5. Hyperlipidemia.  Continue statin 6. Anemia. likely related to hemodilution as well as acute blood loss from surgery.    Hemoglobin remained stable postoperatively.  Discharge Instructions  Discharge Instructions    Diet - low sodium heart healthy   Complete by: As directed    Discharge wound care:   Complete by: As directed    Keep stump clean with soap and water  Apply stump sock   Increase activity slowly   Complete by: As directed  Allergies as of 11/09/2019      Reactions   Augmentin [amoxicillin-pot Clavulanate] Diarrhea, Nausea Only, Other (See Comments)   Stomach pain and dizziness   Cephalexin Diarrhea, Nausea Only, Other (See Comments)   Stomach pain and dizziness   Ciprofloxacin    Patient states he is  not allergic to it. Patient states he is not allergic to anything but according to [facility], pt just had a reaction to cephalexin on 10/26/19 that he did not report to me when I asked.      Medication List    TAKE these medications   amLODipine 10 MG tablet Commonly known as: NORVASC Take 10 mg by mouth daily.   aspirin EC 81 MG tablet Take 81 mg by mouth daily. Reported on 10/06/2015   atorvastatin 10 MG tablet Commonly known as: LIPITOR Take 1 tablet (10 mg total) by mouth daily.   carvedilol 12.5 MG tablet Commonly known as: COREG Take 12.5 mg by mouth 2 (two) times daily.   cloNIDine 0.2 MG tablet Commonly known as: CATAPRES Take 0.2 mg by mouth 2 (two) times daily.   glipiZIDE 10 MG tablet Commonly known as: GLUCOTROL Take 1 tablet by mouth Twice daily.   lisinopril 40 MG tablet Commonly known as: ZESTRIL Take 40 mg by mouth daily.   NovoLIN N ReliOn 100 UNIT/ML injection Generic drug: insulin NPH Human Inject 0.4 mLs (40 Units total) into the skin 2 (two) times daily before a meal. What changed: how much to take   oxyCODONE-acetaminophen 5-325 MG tablet Commonly known as: PERCOCET/ROXICET Take 1 tablet by mouth every 4 (four) hours as needed for moderate pain.   polyethylene glycol 17 g packet Commonly known as: MIRALAX / GLYCOLAX Take 17 g by mouth 2 (two) times daily.            Discharge Care Instructions  (From admission, onward)         Start     Ordered   11/09/19 0000  Discharge wound care:       Comments: Keep stump clean with soap and water  Apply stump sock   11/09/19 0902          Contact information for follow-up providers    Aviva Signs, MD. Schedule an appointment as soon as possible for a visit on 11/25/2019.   Specialty: General Surgery Why: Staple removal in my office if needed on 11/25/19 Contact information: [address] [address] [phone]            Contact information for after-discharge care     [facility] .   Service: Skilled Nursing Contact information: [address] [address] [phone]                 Allergies  Allergen Reactions  . Augmentin [Amoxicillin-Pot Clavulanate] Diarrhea, Nausea Only and Other (See Comments)    Stomach pain and dizziness  . Cephalexin Diarrhea, Nausea Only and Other (See Comments)    Stomach pain and dizziness  . Ciprofloxacin     Patient states he is not allergic to it. Patient states he is not allergic to anything but according to [facility], pt just had a reaction to cephalexin on 10/26/19 that he did not report to me when I asked.    Consultations:  General surgery   Procedures/Studies: DG Chest 2 View  Result Date: 11/02/2019 CLINICAL DATA:  [place], left foot pain EXAM: CHEST - 2 VIEW  COMPARISON:  09/01/2019 FINDINGS: The heart size and mediastinal contours are within normal limits. Both lungs are clear. The visualized skeletal structures are unremarkable. IMPRESSION: No active cardiopulmonary disease. Electronically Signed   By: Randa Ngo M.D.   On: 11/02/2019 18:41   US ARTERIAL SEG MULTIPLE LE (ABI, SEGMENTAL PRESSURES, PVR'S)  Result Date: 11/03/2019 CLINICAL DATA:  Left foot wound.  Black left foot. EXAM: NONINVASIVE PHYSIOLOGIC VASCULAR STUDY OF BILATERAL LOWER EXTREMITIES TECHNIQUE: Non-invasive vascular evaluation of both lower extremities was performed at rest, including calculation of ankle-brachial indices, multiple segmental pressure evaluation, segmental Doppler and segmental pulse volume recording. COMPARISON:  None. FINDINGS: Right Lower Extremity Resting ABI:  1.10 Segmental Pressures: Elevated pressures in the calf likely related to incompressible vessels. Great toe pressure: 88 Arterial Waveforms: Biphasic and triphasic waveforms in the right upper thigh. Biphasic waveforms in the right popliteal artery. Monophasic waveforms in the right ankle.  PVRs: Normal PVRs with maintained waveform amplitude. Left Lower Extremity: Resting ABI: 0.64 Segmental Pressures: Significant pressure drop between the lower thigh and the calf. Great toe pressure: Could not be obtained Arterial Waveforms: Biphasic waveform in the left upper thigh. Monophasic waveform in the left mid thigh and left popliteal artery. No significant waveforms at the left ankle. Markedly decreased left digital waveforms compared to the right. PVRs: PVR waveforms are symmetric to the right lower extremity. Other: Symmetric upper extremity pressures. Ankle Brachial index > 1.4 Non diagnostic secondary to incompressible vessel calcifications 1.0-1.4       Normal 0.9-0.99     Borderline PAD 0.8-0.89     Mild PAD 0.5-0.79     Moderate PAD < 0.5          Severe PAD Toe Brachial Index Normal     >0.65 Moderate  0.53-0.64 Severe     <0.23 Toe Pressures Absolute toe pressure >50mmHg sufficient for wound healing. Toe pressures <74mmHg = critical limb ischemia. IMPRESSION: 1. Right ankle-brachial index is normal, measuring 1.10. 2. Left ankle-brachial index is 0.64 and suggestive for moderate peripheral arterial disease. Significant pressure gradient between the left lower thigh and the left calf is suggestive for occlusive disease in this area. No significant flow identified in the left digital arteries. Electronically Signed   By: Markus Daft M.D.   On: 11/03/2019 17:43   DG CHEST PORT 1 VIEW  Result Date: 11/08/2019 CLINICAL DATA:  Fever and leukocytosis EXAM: PORTABLE CHEST 1 VIEW COMPARISON:  11/02/2019 FINDINGS: The heart size and mediastinal contours are within normal limits. Both lungs are clear. The visualized skeletal structures are unremarkable. IMPRESSION: No active disease. Electronically Signed   By: Fidela Salisbury MD   On: 11/08/2019 21:23   DG Foot Complete Left  Result Date: 11/02/2019 CLINICAL DATA:  Pulled a piece of glass out of his left foot 1 week ago with subsequent redness and  bruising. EXAM: LEFT FOOT - COMPLETE 3+ VIEW COMPARISON:  None. FINDINGS: There is no evidence of fracture or dislocation. There is no evidence of arthropathy or other focal bone abnormality. Mild focal soft tissue swelling is seen along the dorsal aspect of the distal left foot. No radiopaque soft tissue foreign bodies are identified. IMPRESSION: Mild focal dorsal soft tissue swelling with no radiopaque soft tissue foreign bodies identified. Electronically Signed   By: Virgina Norfolk M.D.   On: 11/02/2019 16:17       Subjective: Continues to complain of pain in left stump.  No shortness of breath.  Discharge Exam: Vitals:   11/08/19  0350 11/08/19 1402 11/08/19 2052 11/09/19 0505  BP: 140/79 115/61 133/75 (!) 146/77  Pulse: 69 67 65 71  Resp: 17 18 (!) 23 (!) 24  Temp: 98.8 F (37.1 C) 98.8 F (37.1 C) 98.6 F (37 C) 99.4 F (37.4 C)  TempSrc: Oral Oral Oral Oral  SpO2: 95% 94% 94% 93%  Weight:      Height:        General: Pt is alert, awake, not in acute distress Cardiovascular: RRR, S1/S2 +, no rubs, no gallops Respiratory: CTA bilaterally, no wheezing, no rhonchi Abdominal: Soft, NT, ND, bowel sounds + Extremities: no edema, no cyanosis    The results of significant diagnostics from this hospitalization (including imaging, microbiology, ancillary and laboratory) are listed below for reference.     Microbiology: Recent Results (from the past 240 hour(s))  SARS Coronavirus 2 by RT PCR (hospital order, performed in [facility] hospital lab) Nasopharyngeal Nasopharyngeal Swab     Status: None   Collection Time: 11/02/19  4:48 PM   Specimen: Nasopharyngeal Swab  Result Value Ref Range Status   SARS Coronavirus 2 NEGATIVE NEGATIVE Final    Comment: (NOTE) SARS-CoV-2 target nucleic acids are NOT DETECTED.  The SARS-CoV-2 RNA is generally detectable in upper and lower respiratory specimens during the acute phase of infection. The lowest concentration of SARS-CoV-2 viral  copies this assay can detect is 250 copies / mL. A negative result does not preclude SARS-CoV-2 infection and should not be used as the sole basis for treatment or other patient management decisions.  A negative result may occur with improper specimen collection / handling, submission of specimen other than nasopharyngeal swab, presence of viral mutation(s) within the areas targeted by this assay, and inadequate number of viral copies (<250 copies / mL). A negative result must be combined with clinical observations, patient history, and epidemiological information.  Fact Sheet for Patients:   StrictlyIdeas.no  Fact Sheet for Healthcare Providers: BankingDealers.co.za  This test is not yet approved or  cleared by the [place] FDA and has been authorized for detection and/or diagnosis of SARS-CoV-2 by FDA under an Emergency Use Authorization (EUA).  This EUA will remain in effect (meaning this test can be used) for the duration of the COVID-19 declaration under Section 564(b)(1) of the Act, 21 U.S.C. section 360bbb-3(b)(1), unless the authorization is terminated or revoked sooner.  Performed at [facility], [address] [place], [place] [address]   Blood Culture (routine x 2)     Status: None   Collection Time: 11/02/19  5:05 PM   Specimen: BLOOD RIGHT HAND  Result Value Ref Range Status   Specimen Description   Final    BLOOD RIGHT HAND BOTTLES DRAWN AEROBIC AND ANAEROBIC   Special Requests   Final    Blood Culture results may not be optimal due to an inadequate volume of blood received in culture bottles   Culture   Final    NO GROWTH 5 DAYS Performed at [facility], [address] [place], [place] [address]    Report Status 11/07/2019 FINAL  Final  Blood Culture (routine x 2)     Status: None   Collection Time: 11/02/19  5:15 PM   Specimen: Left Antecubital; Blood  Result Value Ref Range Status   Specimen Description    Final    LEFT ANTECUBITAL BOTTLES DRAWN AEROBIC AND ANAEROBIC   Special Requests Blood Culture adequate volume  Final   Culture   Final    NO  GROWTH 5 DAYS Performed at [facility], [address] [place], [place] [address]    Report Status 11/07/2019 FINAL  Final  Surgical PCR screen     Status: Abnormal   Collection Time: 11/02/19 11:00 PM   Specimen: Nasal Mucosa; Nasal Swab  Result Value Ref Range Status   MRSA, PCR NEGATIVE NEGATIVE Final   Staphylococcus aureus POSITIVE (A) NEGATIVE Final    Comment: RESULT CALLED TO, READ BACK BY AND VERIFIED WITH: BENGTSON,T AT 0745 ON 11/03/19 BY HUFFINES,S (NOTE) The Xpert SA Assay (FDA approved for NASAL specimens in patients 47 years of age and older), is one component of a comprehensive surveillance program. It is not intended to diagnose infection nor to guide or monitor treatment. Performed at [facility], [address] [place], [place] [address]   SARS Coronavirus 2 by RT PCR (hospital order, performed in [facility] hospital lab) Nasopharyngeal Nasopharyngeal Swab     Status: None   Collection Time: 11/08/19  3:14 PM   Specimen: Nasopharyngeal Swab  Result Value Ref Range Status   SARS Coronavirus 2 NEGATIVE NEGATIVE Final    Comment: (NOTE) SARS-CoV-2 target nucleic acids are NOT DETECTED.  The SARS-CoV-2 RNA is generally detectable in upper and lower respiratory specimens during the acute phase of infection. The lowest concentration of SARS-CoV-2 viral copies this assay can detect is 250 copies / mL. A negative result does not preclude SARS-CoV-2 infection and should not be used as the sole basis for treatment or other patient management decisions.  A negative result may occur with improper specimen collection / handling, submission of specimen other than nasopharyngeal swab, presence of viral mutation(s) within the areas targeted by this assay, and inadequate number of viral copies (<250 copies / mL). A negative result  must be combined with clinical observations, patient history, and epidemiological information.  Fact Sheet for Patients:   StrictlyIdeas.no  Fact Sheet for Healthcare Providers: BankingDealers.co.za  This test is not yet approved or  cleared by the [place] FDA and has been authorized for detection and/or diagnosis of SARS-CoV-2 by FDA under an Emergency Use Authorization (EUA).  This EUA will remain in effect (meaning this test can be used) for the duration of the COVID-19 declaration under Section 564(b)(1) of the Act, 21 U.S.C. section 360bbb-3(b)(1), unless the authorization is terminated or revoked sooner.  Performed at [facility], [address], [place], [place] [address]      Labs: BNP (last 3 results) No results for input(s): BNP in the last 8760 hours. Basic Metabolic Panel: Recent Labs  Lab 11/02/19 1715 11/03/19 0405 11/04/19 0605 11/05/19 0633 11/06/19 0606  NA 137 138 139 139 136  K 3.9 3.6 3.6 4.4 3.7  CL 96* 100 100 102 99  CO2 29 27 28 29 29   GLUCOSE 118* 114* 214* 165* 140*  BUN 25* 23 17 12 12   CREATININE 1.31* 1.03 0.79 0.73 0.70  CALCIUM 8.9 8.7* 8.7* 8.2* 8.3*  MG  --  2.3  --   --   --   PHOS  --  4.0  --   --   --    Liver Function Tests: Recent Labs  Lab 11/02/19 1715 11/03/19 0405  AST 42* 30  ALT 50* 39  ALKPHOS 70 65  BILITOT 0.6 0.5  PROT 8.9* 7.7  ALBUMIN 3.3* 2.9*   No results for input(s): LIPASE, AMYLASE in the last 168 hours. No results for input(s): AMMONIA in the last 168 hours. CBC: Recent Labs  Lab 11/02/19 1715 11/03/19 0405 11/04/19 0605 11/04/19 2111 11/06/19 0606 11/07/19 0627 11/08/19 0522  WBC 21.0*   < > 15.5* 14.8* 14.6* 15.4* 14.4*  NEUTROABS 16.1*  --   --   --   --   --   --   HGB 12.1*   < > 10.7* 9.8* 9.2* 8.9* 8.8*  HCT 37.3*   < > 33.5* 32.0* 29.5* 28.3* 28.2*  MCV 91.0   < > 93.1 95.5 93.9 92.8 92.2  PLT 531*   < > 447* 416* 426* 440* 483*    < > = values in this interval not displayed.   Cardiac Enzymes: No results for input(s): CKTOTAL, CKMB, CKMBINDEX, TROPONINI in the last 168 hours. BNP: Invalid input(s): POCBNP CBG: Recent Labs  Lab 11/08/19 0735 11/08/19 1155 11/08/19 1645 11/08/19 1944 11/09/19 0742  GLUCAP 168* 182* 250* 261* 163*   D-Dimer No results for input(s): DDIMER in the last 72 hours. Hgb A1c No results for input(s): HGBA1C in the last 72 hours. Lipid Profile No results for input(s): CHOL, HDL, LDLCALC, TRIG, CHOLHDL, LDLDIRECT in the last 72 hours. Thyroid function studies No results for input(s): TSH, T4TOTAL, T3FREE, THYROIDAB in the last 72 hours.  Invalid input(s): FREET3 Anemia work up No results for input(s): VITAMINB12, FOLATE, FERRITIN, TIBC, IRON, RETICCTPCT in the last 72 hours. Urinalysis    Component Value Date/Time   COLORURINE YELLOW 11/08/2016 1137   APPEARANCEUR HAZY (A) 11/08/2016 1137   LABSPEC 1.028 11/08/2016 1137   PHURINE 5.0 11/08/2016 1137   GLUCOSEU >=500 (A) 11/08/2016 1137   HGBUR NEGATIVE 11/08/2016 1137   BILIRUBINUR neg 06/11/2019 0835   KETONESUR NEGATIVE 11/08/2016 1137   PROTEINUR Positive (A) 06/11/2019 0835   PROTEINUR 30 (A) 11/08/2016 1137   UROBILINOGEN negative (A) 06/11/2019 0835   NITRITE neg 06/11/2019 0835   NITRITE NEGATIVE 11/08/2016 1137   LEUKOCYTESUR Negative 06/11/2019 0835   Sepsis Labs Invalid input(s): PROCALCITONIN,  WBC,  LACTICIDVEN Microbiology Recent Results (from the past 240 hour(s))  SARS Coronavirus 2 by RT PCR (hospital order, performed in [facility] hospital lab) Nasopharyngeal Nasopharyngeal Swab     Status: None   Collection Time: 11/02/19  4:48 PM   Specimen: Nasopharyngeal Swab  Result Value Ref Range Status   SARS Coronavirus 2 NEGATIVE NEGATIVE Final    Comment: (NOTE) SARS-CoV-2 target nucleic acids are NOT DETECTED.  The SARS-CoV-2 RNA is generally detectable in upper and lower respiratory specimens  during the acute phase of infection. The lowest concentration of SARS-CoV-2 viral copies this assay can detect is 250 copies / mL. A negative result does not preclude SARS-CoV-2 infection and should not be used as the sole basis for treatment or other patient management decisions.  A negative result may occur with improper specimen collection / handling, submission of specimen other than nasopharyngeal swab, presence of viral mutation(s) within the areas targeted by this assay, and inadequate number of viral copies (<250 copies / mL). A negative result must be combined with clinical observations, patient history, and epidemiological information.  Fact Sheet for Patients:   StrictlyIdeas.no  Fact Sheet for Healthcare Providers: BankingDealers.co.za  This test is not yet approved or  cleared by the [place] FDA and has been authorized for detection and/or diagnosis of SARS-CoV-2 by FDA under an Emergency Use Authorization (EUA).  This EUA will remain in effect (meaning this test can be used) for the duration of the COVID-19 declaration under Section 564(b)(1) of the Act, 21 U.S.C. section  360bbb-3(b)(1), unless the authorization is terminated or revoked sooner.  Performed at [facility], [address], [place], [place] [address]   Blood Culture (routine x 2)     Status: None   Collection Time: 11/02/19  5:05 PM   Specimen: BLOOD RIGHT HAND  Result Value Ref Range Status   Specimen Description   Final    BLOOD RIGHT HAND BOTTLES DRAWN AEROBIC AND ANAEROBIC   Special Requests   Final    Blood Culture results may not be optimal due to an inadequate volume of blood received in culture bottles   Culture   Final    NO GROWTH 5 DAYS Performed at [facility], [address], [place], [place] [address]    Report Status 11/07/2019 FINAL  Final  Blood Culture (routine x 2)     Status: None   Collection Time: 11/02/19  5:15 PM    Specimen: Left Antecubital; Blood  Result Value Ref Range Status   Specimen Description   Final    LEFT ANTECUBITAL BOTTLES DRAWN AEROBIC AND ANAEROBIC   Special Requests Blood Culture adequate volume  Final   Culture   Final    NO GROWTH 5 DAYS Performed at [facility], [address] [place], [place] [address]    Report Status 11/07/2019 FINAL  Final  Surgical PCR screen     Status: Abnormal   Collection Time: 11/02/19 11:00 PM   Specimen: Nasal Mucosa; Nasal Swab  Result Value Ref Range Status   MRSA, PCR NEGATIVE NEGATIVE Final   Staphylococcus aureus POSITIVE (A) NEGATIVE Final    Comment: RESULT CALLED TO, READ BACK BY AND VERIFIED WITH: BENGTSON,T AT 0745 ON 11/03/19 BY HUFFINES,S (NOTE) The Xpert SA Assay (FDA approved for NASAL specimens in patients 28 years of age and older), is one component of a comprehensive surveillance program. It is not intended to diagnose infection nor to guide or monitor treatment. Performed at [facility], [address], [place],  [address]   SARS Coronavirus 2 by RT PCR (hospital order, performed in [facility] hospital lab) Nasopharyngeal Nasopharyngeal Swab     Status: None   Collection Time: 11/08/19  3:14 PM   Specimen: Nasopharyngeal Swab  Result Value Ref Range Status   SARS Coronavirus 2 NEGATIVE NEGATIVE Final    Comment: (NOTE) SARS-CoV-2 target nucleic acids are NOT DETECTED.  The SARS-CoV-2 RNA is generally detectable in upper and lower respiratory specimens during the acute phase of infection. The lowest concentration of SARS-CoV-2 viral copies this assay can detect is 250 copies / mL. A negative result does not preclude SARS-CoV-2 infection and should not be used as the sole basis for treatment or other patient management decisions.  A negative result may occur with improper specimen collection / handling, submission of specimen other than nasopharyngeal swab, presence of viral mutation(s) within the areas targeted  by this assay, and inadequate number of viral copies (<250 copies / mL). A negative result must be combined with clinical observations, patient history, and epidemiological information.  Fact Sheet for Patients:   StrictlyIdeas.no  Fact Sheet for Healthcare Providers: BankingDealers.co.za  This test is not yet approved or  cleared by the [place] FDA and has been authorized for detection and/or diagnosis of SARS-CoV-2 by FDA under an Emergency Use Authorization (EUA).  This EUA will remain in effect (meaning this test can be used) for the duration of the COVID-19 declaration under Section 564(b)(1) of the Act, 21 U.S.C. section 360bbb-3(b)(1), unless the authorization  is terminated or revoked sooner.  Performed at [facility], [address] [place], [place] [address]      Time coordinating discharge: 29mins  SIGNED:   Kathie Dike, MD  [facility] 11/09/2019, 10:59 AM   If 7PM-7AM, please contact night-coverage [email or web address]

## 2019-11-09 NOTE — Care Management Important Message (Signed)
Important Message  Patient Details  Name: Alfred Miller MRN: [id number] Date of Birth: January 03, 1951   Medicare Important Message Given:  Yes     Tommy Medal 11/09/2019, 11:41 AM

## 2019-11-10 DIAGNOSIS — E1151 Type 2 diabetes mellitus with diabetic peripheral angiopathy without gangrene: Secondary | ICD-10-CM | POA: Diagnosis not present

## 2019-11-10 DIAGNOSIS — E785 Hyperlipidemia, unspecified: Secondary | ICD-10-CM | POA: Diagnosis not present

## 2019-11-10 DIAGNOSIS — I10 Essential (primary) hypertension: Secondary | ICD-10-CM | POA: Diagnosis not present

## 2019-11-10 DIAGNOSIS — E1169 Type 2 diabetes mellitus with other specified complication: Secondary | ICD-10-CM | POA: Diagnosis not present

## 2019-11-11 DIAGNOSIS — S20214A Contusion of middle front wall of thorax, initial encounter: Secondary | ICD-10-CM | POA: Diagnosis not present

## 2019-11-11 DIAGNOSIS — R0602 Shortness of breath: Secondary | ICD-10-CM | POA: Diagnosis not present

## 2019-11-11 DIAGNOSIS — S2020XD Contusion of thorax, unspecified, subsequent encounter: Secondary | ICD-10-CM | POA: Diagnosis not present

## 2019-11-11 DIAGNOSIS — Z89512 Acquired absence of left leg below knee: Secondary | ICD-10-CM | POA: Diagnosis not present

## 2019-11-11 DIAGNOSIS — R079 Chest pain, unspecified: Secondary | ICD-10-CM | POA: Diagnosis not present

## 2019-11-11 DIAGNOSIS — Z20822 Contact with and (suspected) exposure to covid-19: Secondary | ICD-10-CM | POA: Diagnosis not present

## 2019-11-11 DIAGNOSIS — J90 Pleural effusion, not elsewhere classified: Secondary | ICD-10-CM | POA: Diagnosis not present

## 2019-11-11 DIAGNOSIS — Z888 Allergy status to other drugs, medicaments and biological substances status: Secondary | ICD-10-CM | POA: Diagnosis not present

## 2019-11-11 DIAGNOSIS — I2510 Atherosclerotic heart disease of native coronary artery without angina pectoris: Secondary | ICD-10-CM | POA: Diagnosis not present

## 2019-11-11 DIAGNOSIS — E1140 Type 2 diabetes mellitus with diabetic neuropathy, unspecified: Secondary | ICD-10-CM | POA: Diagnosis not present

## 2019-11-11 DIAGNOSIS — I712 Thoracic aortic aneurysm, without rupture: Secondary | ICD-10-CM | POA: Diagnosis not present

## 2019-11-11 DIAGNOSIS — R21 Rash and other nonspecific skin eruption: Secondary | ICD-10-CM | POA: Diagnosis not present

## 2019-11-11 DIAGNOSIS — R110 Nausea: Secondary | ICD-10-CM | POA: Diagnosis not present

## 2019-11-11 DIAGNOSIS — J9811 Atelectasis: Secondary | ICD-10-CM | POA: Diagnosis not present

## 2019-11-11 DIAGNOSIS — I700 Atherosclerosis of aorta: Secondary | ICD-10-CM | POA: Diagnosis not present

## 2019-11-11 DIAGNOSIS — X58XXXA Exposure to other specified factors, initial encounter: Secondary | ICD-10-CM | POA: Diagnosis not present

## 2019-11-11 DIAGNOSIS — W050XXD Fall from non-moving wheelchair, subsequent encounter: Secondary | ICD-10-CM | POA: Diagnosis not present

## 2019-11-12 DIAGNOSIS — I10 Essential (primary) hypertension: Secondary | ICD-10-CM | POA: Diagnosis not present

## 2019-11-12 DIAGNOSIS — E1169 Type 2 diabetes mellitus with other specified complication: Secondary | ICD-10-CM | POA: Diagnosis not present

## 2019-11-12 DIAGNOSIS — E1151 Type 2 diabetes mellitus with diabetic peripheral angiopathy without gangrene: Secondary | ICD-10-CM | POA: Diagnosis not present

## 2019-11-19 DIAGNOSIS — E1169 Type 2 diabetes mellitus with other specified complication: Secondary | ICD-10-CM | POA: Diagnosis not present

## 2019-11-19 DIAGNOSIS — I10 Essential (primary) hypertension: Secondary | ICD-10-CM | POA: Diagnosis not present

## 2019-11-19 DIAGNOSIS — E1151 Type 2 diabetes mellitus with diabetic peripheral angiopathy without gangrene: Secondary | ICD-10-CM | POA: Diagnosis not present

## 2019-11-25 ENCOUNTER — Ambulatory Visit: Payer: Medicare HMO | Admitting: General Surgery

## 2019-12-01 DIAGNOSIS — Z4781 Encounter for orthopedic aftercare following surgical amputation: Secondary | ICD-10-CM | POA: Diagnosis not present

## 2019-12-02 DIAGNOSIS — E1165 Type 2 diabetes mellitus with hyperglycemia: Secondary | ICD-10-CM | POA: Diagnosis not present

## 2019-12-02 DIAGNOSIS — E785 Hyperlipidemia, unspecified: Secondary | ICD-10-CM | POA: Diagnosis not present

## 2019-12-02 DIAGNOSIS — E1122 Type 2 diabetes mellitus with diabetic chronic kidney disease: Secondary | ICD-10-CM | POA: Diagnosis not present

## 2019-12-02 DIAGNOSIS — Z89512 Acquired absence of left leg below knee: Secondary | ICD-10-CM | POA: Diagnosis not present

## 2019-12-02 DIAGNOSIS — I77810 Thoracic aortic ectasia: Secondary | ICD-10-CM | POA: Diagnosis not present

## 2019-12-02 DIAGNOSIS — K5904 Chronic idiopathic constipation: Secondary | ICD-10-CM | POA: Diagnosis not present

## 2019-12-02 DIAGNOSIS — Z4781 Encounter for orthopedic aftercare following surgical amputation: Secondary | ICD-10-CM | POA: Diagnosis not present

## 2019-12-02 DIAGNOSIS — I2510 Atherosclerotic heart disease of native coronary artery without angina pectoris: Secondary | ICD-10-CM | POA: Diagnosis not present

## 2019-12-02 DIAGNOSIS — Z299 Encounter for prophylactic measures, unspecified: Secondary | ICD-10-CM | POA: Diagnosis not present

## 2019-12-02 DIAGNOSIS — I10 Essential (primary) hypertension: Secondary | ICD-10-CM | POA: Diagnosis not present

## 2019-12-02 DIAGNOSIS — E1151 Type 2 diabetes mellitus with diabetic peripheral angiopathy without gangrene: Secondary | ICD-10-CM | POA: Diagnosis not present

## 2019-12-02 DIAGNOSIS — E1169 Type 2 diabetes mellitus with other specified complication: Secondary | ICD-10-CM | POA: Diagnosis not present

## 2019-12-02 DIAGNOSIS — N182 Chronic kidney disease, stage 2 (mild): Secondary | ICD-10-CM | POA: Diagnosis not present

## 2019-12-02 DIAGNOSIS — E1140 Type 2 diabetes mellitus with diabetic neuropathy, unspecified: Secondary | ICD-10-CM | POA: Diagnosis not present

## 2019-12-02 DIAGNOSIS — I129 Hypertensive chronic kidney disease with stage 1 through stage 4 chronic kidney disease, or unspecified chronic kidney disease: Secondary | ICD-10-CM | POA: Diagnosis not present

## 2019-12-03 DIAGNOSIS — I2510 Atherosclerotic heart disease of native coronary artery without angina pectoris: Secondary | ICD-10-CM | POA: Diagnosis not present

## 2019-12-03 DIAGNOSIS — Z4781 Encounter for orthopedic aftercare following surgical amputation: Secondary | ICD-10-CM | POA: Diagnosis not present

## 2019-12-03 DIAGNOSIS — I129 Hypertensive chronic kidney disease with stage 1 through stage 4 chronic kidney disease, or unspecified chronic kidney disease: Secondary | ICD-10-CM | POA: Diagnosis not present

## 2019-12-03 DIAGNOSIS — E1151 Type 2 diabetes mellitus with diabetic peripheral angiopathy without gangrene: Secondary | ICD-10-CM | POA: Diagnosis not present

## 2019-12-03 DIAGNOSIS — E1169 Type 2 diabetes mellitus with other specified complication: Secondary | ICD-10-CM | POA: Diagnosis not present

## 2019-12-03 DIAGNOSIS — E1122 Type 2 diabetes mellitus with diabetic chronic kidney disease: Secondary | ICD-10-CM | POA: Diagnosis not present

## 2019-12-03 DIAGNOSIS — N182 Chronic kidney disease, stage 2 (mild): Secondary | ICD-10-CM | POA: Diagnosis not present

## 2019-12-03 DIAGNOSIS — E1140 Type 2 diabetes mellitus with diabetic neuropathy, unspecified: Secondary | ICD-10-CM | POA: Diagnosis not present

## 2019-12-03 DIAGNOSIS — E785 Hyperlipidemia, unspecified: Secondary | ICD-10-CM | POA: Diagnosis not present

## 2019-12-03 DIAGNOSIS — K5904 Chronic idiopathic constipation: Secondary | ICD-10-CM | POA: Diagnosis not present

## 2019-12-06 DIAGNOSIS — Z4781 Encounter for orthopedic aftercare following surgical amputation: Secondary | ICD-10-CM | POA: Diagnosis not present

## 2019-12-06 DIAGNOSIS — E1140 Type 2 diabetes mellitus with diabetic neuropathy, unspecified: Secondary | ICD-10-CM | POA: Diagnosis not present

## 2019-12-06 DIAGNOSIS — E1169 Type 2 diabetes mellitus with other specified complication: Secondary | ICD-10-CM | POA: Diagnosis not present

## 2019-12-06 DIAGNOSIS — I2510 Atherosclerotic heart disease of native coronary artery without angina pectoris: Secondary | ICD-10-CM | POA: Diagnosis not present

## 2019-12-06 DIAGNOSIS — I129 Hypertensive chronic kidney disease with stage 1 through stage 4 chronic kidney disease, or unspecified chronic kidney disease: Secondary | ICD-10-CM | POA: Diagnosis not present

## 2019-12-06 DIAGNOSIS — K5904 Chronic idiopathic constipation: Secondary | ICD-10-CM | POA: Diagnosis not present

## 2019-12-06 DIAGNOSIS — E785 Hyperlipidemia, unspecified: Secondary | ICD-10-CM | POA: Diagnosis not present

## 2019-12-06 DIAGNOSIS — E1151 Type 2 diabetes mellitus with diabetic peripheral angiopathy without gangrene: Secondary | ICD-10-CM | POA: Diagnosis not present

## 2019-12-06 DIAGNOSIS — N182 Chronic kidney disease, stage 2 (mild): Secondary | ICD-10-CM | POA: Diagnosis not present

## 2019-12-06 DIAGNOSIS — E1122 Type 2 diabetes mellitus with diabetic chronic kidney disease: Secondary | ICD-10-CM | POA: Diagnosis not present

## 2019-12-07 DIAGNOSIS — E1140 Type 2 diabetes mellitus with diabetic neuropathy, unspecified: Secondary | ICD-10-CM | POA: Diagnosis not present

## 2019-12-07 DIAGNOSIS — K5904 Chronic idiopathic constipation: Secondary | ICD-10-CM | POA: Diagnosis not present

## 2019-12-07 DIAGNOSIS — I2510 Atherosclerotic heart disease of native coronary artery without angina pectoris: Secondary | ICD-10-CM | POA: Diagnosis not present

## 2019-12-07 DIAGNOSIS — Z4781 Encounter for orthopedic aftercare following surgical amputation: Secondary | ICD-10-CM | POA: Diagnosis not present

## 2019-12-07 DIAGNOSIS — I129 Hypertensive chronic kidney disease with stage 1 through stage 4 chronic kidney disease, or unspecified chronic kidney disease: Secondary | ICD-10-CM | POA: Diagnosis not present

## 2019-12-07 DIAGNOSIS — N182 Chronic kidney disease, stage 2 (mild): Secondary | ICD-10-CM | POA: Diagnosis not present

## 2019-12-07 DIAGNOSIS — E785 Hyperlipidemia, unspecified: Secondary | ICD-10-CM | POA: Diagnosis not present

## 2019-12-07 DIAGNOSIS — E1169 Type 2 diabetes mellitus with other specified complication: Secondary | ICD-10-CM | POA: Diagnosis not present

## 2019-12-07 DIAGNOSIS — E1122 Type 2 diabetes mellitus with diabetic chronic kidney disease: Secondary | ICD-10-CM | POA: Diagnosis not present

## 2019-12-07 DIAGNOSIS — E1151 Type 2 diabetes mellitus with diabetic peripheral angiopathy without gangrene: Secondary | ICD-10-CM | POA: Diagnosis not present

## 2019-12-08 DIAGNOSIS — I2510 Atherosclerotic heart disease of native coronary artery without angina pectoris: Secondary | ICD-10-CM | POA: Diagnosis not present

## 2019-12-08 DIAGNOSIS — E1122 Type 2 diabetes mellitus with diabetic chronic kidney disease: Secondary | ICD-10-CM | POA: Diagnosis not present

## 2019-12-08 DIAGNOSIS — E1169 Type 2 diabetes mellitus with other specified complication: Secondary | ICD-10-CM | POA: Diagnosis not present

## 2019-12-08 DIAGNOSIS — I129 Hypertensive chronic kidney disease with stage 1 through stage 4 chronic kidney disease, or unspecified chronic kidney disease: Secondary | ICD-10-CM | POA: Diagnosis not present

## 2019-12-08 DIAGNOSIS — E785 Hyperlipidemia, unspecified: Secondary | ICD-10-CM | POA: Diagnosis not present

## 2019-12-08 DIAGNOSIS — N182 Chronic kidney disease, stage 2 (mild): Secondary | ICD-10-CM | POA: Diagnosis not present

## 2019-12-08 DIAGNOSIS — E1151 Type 2 diabetes mellitus with diabetic peripheral angiopathy without gangrene: Secondary | ICD-10-CM | POA: Diagnosis not present

## 2019-12-08 DIAGNOSIS — Z4781 Encounter for orthopedic aftercare following surgical amputation: Secondary | ICD-10-CM | POA: Diagnosis not present

## 2019-12-08 DIAGNOSIS — K5904 Chronic idiopathic constipation: Secondary | ICD-10-CM | POA: Diagnosis not present

## 2019-12-08 DIAGNOSIS — E1140 Type 2 diabetes mellitus with diabetic neuropathy, unspecified: Secondary | ICD-10-CM | POA: Diagnosis not present

## 2019-12-09 DIAGNOSIS — I129 Hypertensive chronic kidney disease with stage 1 through stage 4 chronic kidney disease, or unspecified chronic kidney disease: Secondary | ICD-10-CM | POA: Diagnosis not present

## 2019-12-09 DIAGNOSIS — K5904 Chronic idiopathic constipation: Secondary | ICD-10-CM | POA: Diagnosis not present

## 2019-12-09 DIAGNOSIS — I2510 Atherosclerotic heart disease of native coronary artery without angina pectoris: Secondary | ICD-10-CM | POA: Diagnosis not present

## 2019-12-09 DIAGNOSIS — E1169 Type 2 diabetes mellitus with other specified complication: Secondary | ICD-10-CM | POA: Diagnosis not present

## 2019-12-09 DIAGNOSIS — N182 Chronic kidney disease, stage 2 (mild): Secondary | ICD-10-CM | POA: Diagnosis not present

## 2019-12-09 DIAGNOSIS — E1140 Type 2 diabetes mellitus with diabetic neuropathy, unspecified: Secondary | ICD-10-CM | POA: Diagnosis not present

## 2019-12-09 DIAGNOSIS — E1122 Type 2 diabetes mellitus with diabetic chronic kidney disease: Secondary | ICD-10-CM | POA: Diagnosis not present

## 2019-12-09 DIAGNOSIS — Z4781 Encounter for orthopedic aftercare following surgical amputation: Secondary | ICD-10-CM | POA: Diagnosis not present

## 2019-12-09 DIAGNOSIS — E785 Hyperlipidemia, unspecified: Secondary | ICD-10-CM | POA: Diagnosis not present

## 2019-12-09 DIAGNOSIS — E1151 Type 2 diabetes mellitus with diabetic peripheral angiopathy without gangrene: Secondary | ICD-10-CM | POA: Diagnosis not present

## 2019-12-10 DIAGNOSIS — I129 Hypertensive chronic kidney disease with stage 1 through stage 4 chronic kidney disease, or unspecified chronic kidney disease: Secondary | ICD-10-CM | POA: Diagnosis not present

## 2019-12-10 DIAGNOSIS — Z4781 Encounter for orthopedic aftercare following surgical amputation: Secondary | ICD-10-CM | POA: Diagnosis not present

## 2019-12-10 DIAGNOSIS — I2510 Atherosclerotic heart disease of native coronary artery without angina pectoris: Secondary | ICD-10-CM | POA: Diagnosis not present

## 2019-12-10 DIAGNOSIS — E1169 Type 2 diabetes mellitus with other specified complication: Secondary | ICD-10-CM | POA: Diagnosis not present

## 2019-12-10 DIAGNOSIS — K5904 Chronic idiopathic constipation: Secondary | ICD-10-CM | POA: Diagnosis not present

## 2019-12-10 DIAGNOSIS — E1151 Type 2 diabetes mellitus with diabetic peripheral angiopathy without gangrene: Secondary | ICD-10-CM | POA: Diagnosis not present

## 2019-12-10 DIAGNOSIS — E785 Hyperlipidemia, unspecified: Secondary | ICD-10-CM | POA: Diagnosis not present

## 2019-12-10 DIAGNOSIS — N182 Chronic kidney disease, stage 2 (mild): Secondary | ICD-10-CM | POA: Diagnosis not present

## 2019-12-10 DIAGNOSIS — E1140 Type 2 diabetes mellitus with diabetic neuropathy, unspecified: Secondary | ICD-10-CM | POA: Diagnosis not present

## 2019-12-10 DIAGNOSIS — E1122 Type 2 diabetes mellitus with diabetic chronic kidney disease: Secondary | ICD-10-CM | POA: Diagnosis not present

## 2019-12-11 DIAGNOSIS — N182 Chronic kidney disease, stage 2 (mild): Secondary | ICD-10-CM | POA: Diagnosis not present

## 2019-12-11 DIAGNOSIS — E1169 Type 2 diabetes mellitus with other specified complication: Secondary | ICD-10-CM | POA: Diagnosis not present

## 2019-12-11 DIAGNOSIS — K5904 Chronic idiopathic constipation: Secondary | ICD-10-CM | POA: Diagnosis not present

## 2019-12-11 DIAGNOSIS — I2510 Atherosclerotic heart disease of native coronary artery without angina pectoris: Secondary | ICD-10-CM | POA: Diagnosis not present

## 2019-12-11 DIAGNOSIS — Z4781 Encounter for orthopedic aftercare following surgical amputation: Secondary | ICD-10-CM | POA: Diagnosis not present

## 2019-12-11 DIAGNOSIS — E1122 Type 2 diabetes mellitus with diabetic chronic kidney disease: Secondary | ICD-10-CM | POA: Diagnosis not present

## 2019-12-11 DIAGNOSIS — E1151 Type 2 diabetes mellitus with diabetic peripheral angiopathy without gangrene: Secondary | ICD-10-CM | POA: Diagnosis not present

## 2019-12-11 DIAGNOSIS — E785 Hyperlipidemia, unspecified: Secondary | ICD-10-CM | POA: Diagnosis not present

## 2019-12-11 DIAGNOSIS — E1140 Type 2 diabetes mellitus with diabetic neuropathy, unspecified: Secondary | ICD-10-CM | POA: Diagnosis not present

## 2019-12-11 DIAGNOSIS — I129 Hypertensive chronic kidney disease with stage 1 through stage 4 chronic kidney disease, or unspecified chronic kidney disease: Secondary | ICD-10-CM | POA: Diagnosis not present

## 2019-12-14 DIAGNOSIS — E785 Hyperlipidemia, unspecified: Secondary | ICD-10-CM | POA: Diagnosis not present

## 2019-12-14 DIAGNOSIS — I2510 Atherosclerotic heart disease of native coronary artery without angina pectoris: Secondary | ICD-10-CM | POA: Diagnosis not present

## 2019-12-14 DIAGNOSIS — E1169 Type 2 diabetes mellitus with other specified complication: Secondary | ICD-10-CM | POA: Diagnosis not present

## 2019-12-14 DIAGNOSIS — I129 Hypertensive chronic kidney disease with stage 1 through stage 4 chronic kidney disease, or unspecified chronic kidney disease: Secondary | ICD-10-CM | POA: Diagnosis not present

## 2019-12-14 DIAGNOSIS — E1140 Type 2 diabetes mellitus with diabetic neuropathy, unspecified: Secondary | ICD-10-CM | POA: Diagnosis not present

## 2019-12-14 DIAGNOSIS — K5904 Chronic idiopathic constipation: Secondary | ICD-10-CM | POA: Diagnosis not present

## 2019-12-14 DIAGNOSIS — N182 Chronic kidney disease, stage 2 (mild): Secondary | ICD-10-CM | POA: Diagnosis not present

## 2019-12-14 DIAGNOSIS — Z4781 Encounter for orthopedic aftercare following surgical amputation: Secondary | ICD-10-CM | POA: Diagnosis not present

## 2019-12-14 DIAGNOSIS — E1122 Type 2 diabetes mellitus with diabetic chronic kidney disease: Secondary | ICD-10-CM | POA: Diagnosis not present

## 2019-12-14 DIAGNOSIS — E1151 Type 2 diabetes mellitus with diabetic peripheral angiopathy without gangrene: Secondary | ICD-10-CM | POA: Diagnosis not present

## 2019-12-16 DIAGNOSIS — I129 Hypertensive chronic kidney disease with stage 1 through stage 4 chronic kidney disease, or unspecified chronic kidney disease: Secondary | ICD-10-CM | POA: Diagnosis not present

## 2019-12-16 DIAGNOSIS — I2510 Atherosclerotic heart disease of native coronary artery without angina pectoris: Secondary | ICD-10-CM | POA: Diagnosis not present

## 2019-12-16 DIAGNOSIS — E1140 Type 2 diabetes mellitus with diabetic neuropathy, unspecified: Secondary | ICD-10-CM | POA: Diagnosis not present

## 2019-12-16 DIAGNOSIS — K5904 Chronic idiopathic constipation: Secondary | ICD-10-CM | POA: Diagnosis not present

## 2019-12-16 DIAGNOSIS — E1151 Type 2 diabetes mellitus with diabetic peripheral angiopathy without gangrene: Secondary | ICD-10-CM | POA: Diagnosis not present

## 2019-12-16 DIAGNOSIS — E1169 Type 2 diabetes mellitus with other specified complication: Secondary | ICD-10-CM | POA: Diagnosis not present

## 2019-12-16 DIAGNOSIS — Z4781 Encounter for orthopedic aftercare following surgical amputation: Secondary | ICD-10-CM | POA: Diagnosis not present

## 2019-12-16 DIAGNOSIS — E1122 Type 2 diabetes mellitus with diabetic chronic kidney disease: Secondary | ICD-10-CM | POA: Diagnosis not present

## 2019-12-16 DIAGNOSIS — E785 Hyperlipidemia, unspecified: Secondary | ICD-10-CM | POA: Diagnosis not present

## 2019-12-16 DIAGNOSIS — N182 Chronic kidney disease, stage 2 (mild): Secondary | ICD-10-CM | POA: Diagnosis not present

## 2019-12-19 DIAGNOSIS — Z4781 Encounter for orthopedic aftercare following surgical amputation: Secondary | ICD-10-CM | POA: Diagnosis not present

## 2019-12-20 DIAGNOSIS — K5904 Chronic idiopathic constipation: Secondary | ICD-10-CM | POA: Diagnosis not present

## 2019-12-20 DIAGNOSIS — Z4781 Encounter for orthopedic aftercare following surgical amputation: Secondary | ICD-10-CM | POA: Diagnosis not present

## 2019-12-20 DIAGNOSIS — E785 Hyperlipidemia, unspecified: Secondary | ICD-10-CM | POA: Diagnosis not present

## 2019-12-20 DIAGNOSIS — E1140 Type 2 diabetes mellitus with diabetic neuropathy, unspecified: Secondary | ICD-10-CM | POA: Diagnosis not present

## 2019-12-20 DIAGNOSIS — E1169 Type 2 diabetes mellitus with other specified complication: Secondary | ICD-10-CM | POA: Diagnosis not present

## 2019-12-20 DIAGNOSIS — N182 Chronic kidney disease, stage 2 (mild): Secondary | ICD-10-CM | POA: Diagnosis not present

## 2019-12-20 DIAGNOSIS — E1151 Type 2 diabetes mellitus with diabetic peripheral angiopathy without gangrene: Secondary | ICD-10-CM | POA: Diagnosis not present

## 2019-12-20 DIAGNOSIS — I129 Hypertensive chronic kidney disease with stage 1 through stage 4 chronic kidney disease, or unspecified chronic kidney disease: Secondary | ICD-10-CM | POA: Diagnosis not present

## 2019-12-20 DIAGNOSIS — I2510 Atherosclerotic heart disease of native coronary artery without angina pectoris: Secondary | ICD-10-CM | POA: Diagnosis not present

## 2019-12-20 DIAGNOSIS — E1122 Type 2 diabetes mellitus with diabetic chronic kidney disease: Secondary | ICD-10-CM | POA: Diagnosis not present

## 2019-12-22 DIAGNOSIS — E1169 Type 2 diabetes mellitus with other specified complication: Secondary | ICD-10-CM | POA: Diagnosis not present

## 2019-12-22 DIAGNOSIS — E1151 Type 2 diabetes mellitus with diabetic peripheral angiopathy without gangrene: Secondary | ICD-10-CM | POA: Diagnosis not present

## 2019-12-22 DIAGNOSIS — N182 Chronic kidney disease, stage 2 (mild): Secondary | ICD-10-CM | POA: Diagnosis not present

## 2019-12-22 DIAGNOSIS — K5904 Chronic idiopathic constipation: Secondary | ICD-10-CM | POA: Diagnosis not present

## 2019-12-22 DIAGNOSIS — E785 Hyperlipidemia, unspecified: Secondary | ICD-10-CM | POA: Diagnosis not present

## 2019-12-22 DIAGNOSIS — E1140 Type 2 diabetes mellitus with diabetic neuropathy, unspecified: Secondary | ICD-10-CM | POA: Diagnosis not present

## 2019-12-22 DIAGNOSIS — I2510 Atherosclerotic heart disease of native coronary artery without angina pectoris: Secondary | ICD-10-CM | POA: Diagnosis not present

## 2019-12-22 DIAGNOSIS — I129 Hypertensive chronic kidney disease with stage 1 through stage 4 chronic kidney disease, or unspecified chronic kidney disease: Secondary | ICD-10-CM | POA: Diagnosis not present

## 2019-12-22 DIAGNOSIS — Z4781 Encounter for orthopedic aftercare following surgical amputation: Secondary | ICD-10-CM | POA: Diagnosis not present

## 2019-12-22 DIAGNOSIS — E1122 Type 2 diabetes mellitus with diabetic chronic kidney disease: Secondary | ICD-10-CM | POA: Diagnosis not present

## 2019-12-23 DIAGNOSIS — K5904 Chronic idiopathic constipation: Secondary | ICD-10-CM | POA: Diagnosis not present

## 2019-12-23 DIAGNOSIS — Z4781 Encounter for orthopedic aftercare following surgical amputation: Secondary | ICD-10-CM | POA: Diagnosis not present

## 2019-12-23 DIAGNOSIS — E1169 Type 2 diabetes mellitus with other specified complication: Secondary | ICD-10-CM | POA: Diagnosis not present

## 2019-12-23 DIAGNOSIS — I129 Hypertensive chronic kidney disease with stage 1 through stage 4 chronic kidney disease, or unspecified chronic kidney disease: Secondary | ICD-10-CM | POA: Diagnosis not present

## 2019-12-23 DIAGNOSIS — I2510 Atherosclerotic heart disease of native coronary artery without angina pectoris: Secondary | ICD-10-CM | POA: Diagnosis not present

## 2019-12-23 DIAGNOSIS — E1151 Type 2 diabetes mellitus with diabetic peripheral angiopathy without gangrene: Secondary | ICD-10-CM | POA: Diagnosis not present

## 2019-12-23 DIAGNOSIS — N182 Chronic kidney disease, stage 2 (mild): Secondary | ICD-10-CM | POA: Diagnosis not present

## 2019-12-23 DIAGNOSIS — E1122 Type 2 diabetes mellitus with diabetic chronic kidney disease: Secondary | ICD-10-CM | POA: Diagnosis not present

## 2019-12-23 DIAGNOSIS — E785 Hyperlipidemia, unspecified: Secondary | ICD-10-CM | POA: Diagnosis not present

## 2019-12-23 DIAGNOSIS — E1140 Type 2 diabetes mellitus with diabetic neuropathy, unspecified: Secondary | ICD-10-CM | POA: Diagnosis not present

## 2019-12-25 DIAGNOSIS — I129 Hypertensive chronic kidney disease with stage 1 through stage 4 chronic kidney disease, or unspecified chronic kidney disease: Secondary | ICD-10-CM | POA: Diagnosis not present

## 2019-12-25 DIAGNOSIS — K5904 Chronic idiopathic constipation: Secondary | ICD-10-CM | POA: Diagnosis not present

## 2019-12-25 DIAGNOSIS — E1140 Type 2 diabetes mellitus with diabetic neuropathy, unspecified: Secondary | ICD-10-CM | POA: Diagnosis not present

## 2019-12-25 DIAGNOSIS — E1122 Type 2 diabetes mellitus with diabetic chronic kidney disease: Secondary | ICD-10-CM | POA: Diagnosis not present

## 2019-12-25 DIAGNOSIS — I2510 Atherosclerotic heart disease of native coronary artery without angina pectoris: Secondary | ICD-10-CM | POA: Diagnosis not present

## 2019-12-25 DIAGNOSIS — E1169 Type 2 diabetes mellitus with other specified complication: Secondary | ICD-10-CM | POA: Diagnosis not present

## 2019-12-25 DIAGNOSIS — E1151 Type 2 diabetes mellitus with diabetic peripheral angiopathy without gangrene: Secondary | ICD-10-CM | POA: Diagnosis not present

## 2019-12-25 DIAGNOSIS — Z4781 Encounter for orthopedic aftercare following surgical amputation: Secondary | ICD-10-CM | POA: Diagnosis not present

## 2019-12-25 DIAGNOSIS — E785 Hyperlipidemia, unspecified: Secondary | ICD-10-CM | POA: Diagnosis not present

## 2019-12-25 DIAGNOSIS — N182 Chronic kidney disease, stage 2 (mild): Secondary | ICD-10-CM | POA: Diagnosis not present

## 2020-01-04 DIAGNOSIS — E785 Hyperlipidemia, unspecified: Secondary | ICD-10-CM | POA: Diagnosis not present

## 2020-01-04 DIAGNOSIS — E1122 Type 2 diabetes mellitus with diabetic chronic kidney disease: Secondary | ICD-10-CM | POA: Diagnosis not present

## 2020-01-04 DIAGNOSIS — E1140 Type 2 diabetes mellitus with diabetic neuropathy, unspecified: Secondary | ICD-10-CM | POA: Diagnosis not present

## 2020-01-04 DIAGNOSIS — E1169 Type 2 diabetes mellitus with other specified complication: Secondary | ICD-10-CM | POA: Diagnosis not present

## 2020-01-04 DIAGNOSIS — E1151 Type 2 diabetes mellitus with diabetic peripheral angiopathy without gangrene: Secondary | ICD-10-CM | POA: Diagnosis not present

## 2020-01-04 DIAGNOSIS — N182 Chronic kidney disease, stage 2 (mild): Secondary | ICD-10-CM | POA: Diagnosis not present

## 2020-01-04 DIAGNOSIS — K5904 Chronic idiopathic constipation: Secondary | ICD-10-CM | POA: Diagnosis not present

## 2020-01-04 DIAGNOSIS — I129 Hypertensive chronic kidney disease with stage 1 through stage 4 chronic kidney disease, or unspecified chronic kidney disease: Secondary | ICD-10-CM | POA: Diagnosis not present

## 2020-01-04 DIAGNOSIS — I2510 Atherosclerotic heart disease of native coronary artery without angina pectoris: Secondary | ICD-10-CM | POA: Diagnosis not present

## 2020-01-04 DIAGNOSIS — Z4781 Encounter for orthopedic aftercare following surgical amputation: Secondary | ICD-10-CM | POA: Diagnosis not present

## 2020-01-13 DIAGNOSIS — K5904 Chronic idiopathic constipation: Secondary | ICD-10-CM | POA: Diagnosis not present

## 2020-01-13 DIAGNOSIS — Z4781 Encounter for orthopedic aftercare following surgical amputation: Secondary | ICD-10-CM | POA: Diagnosis not present

## 2020-01-13 DIAGNOSIS — N182 Chronic kidney disease, stage 2 (mild): Secondary | ICD-10-CM | POA: Diagnosis not present

## 2020-01-13 DIAGNOSIS — E785 Hyperlipidemia, unspecified: Secondary | ICD-10-CM | POA: Diagnosis not present

## 2020-01-13 DIAGNOSIS — E1169 Type 2 diabetes mellitus with other specified complication: Secondary | ICD-10-CM | POA: Diagnosis not present

## 2020-01-13 DIAGNOSIS — E1122 Type 2 diabetes mellitus with diabetic chronic kidney disease: Secondary | ICD-10-CM | POA: Diagnosis not present

## 2020-01-13 DIAGNOSIS — I2510 Atherosclerotic heart disease of native coronary artery without angina pectoris: Secondary | ICD-10-CM | POA: Diagnosis not present

## 2020-01-13 DIAGNOSIS — E1140 Type 2 diabetes mellitus with diabetic neuropathy, unspecified: Secondary | ICD-10-CM | POA: Diagnosis not present

## 2020-01-13 DIAGNOSIS — I129 Hypertensive chronic kidney disease with stage 1 through stage 4 chronic kidney disease, or unspecified chronic kidney disease: Secondary | ICD-10-CM | POA: Diagnosis not present

## 2020-01-13 DIAGNOSIS — E1151 Type 2 diabetes mellitus with diabetic peripheral angiopathy without gangrene: Secondary | ICD-10-CM | POA: Diagnosis not present

## 2020-01-14 DIAGNOSIS — E119 Type 2 diabetes mellitus without complications: Secondary | ICD-10-CM | POA: Diagnosis not present

## 2020-01-14 DIAGNOSIS — I10 Essential (primary) hypertension: Secondary | ICD-10-CM | POA: Diagnosis not present

## 2020-01-14 DIAGNOSIS — E7800 Pure hypercholesterolemia, unspecified: Secondary | ICD-10-CM | POA: Diagnosis not present

## 2020-01-15 DIAGNOSIS — I10 Essential (primary) hypertension: Secondary | ICD-10-CM | POA: Diagnosis not present

## 2020-01-22 DIAGNOSIS — N182 Chronic kidney disease, stage 2 (mild): Secondary | ICD-10-CM | POA: Diagnosis not present

## 2020-01-22 DIAGNOSIS — I2510 Atherosclerotic heart disease of native coronary artery without angina pectoris: Secondary | ICD-10-CM | POA: Diagnosis not present

## 2020-01-22 DIAGNOSIS — E1151 Type 2 diabetes mellitus with diabetic peripheral angiopathy without gangrene: Secondary | ICD-10-CM | POA: Diagnosis not present

## 2020-01-22 DIAGNOSIS — I129 Hypertensive chronic kidney disease with stage 1 through stage 4 chronic kidney disease, or unspecified chronic kidney disease: Secondary | ICD-10-CM | POA: Diagnosis not present

## 2020-01-22 DIAGNOSIS — E1122 Type 2 diabetes mellitus with diabetic chronic kidney disease: Secondary | ICD-10-CM | POA: Diagnosis not present

## 2020-01-22 DIAGNOSIS — E1169 Type 2 diabetes mellitus with other specified complication: Secondary | ICD-10-CM | POA: Diagnosis not present

## 2020-01-22 DIAGNOSIS — K5904 Chronic idiopathic constipation: Secondary | ICD-10-CM | POA: Diagnosis not present

## 2020-01-22 DIAGNOSIS — E785 Hyperlipidemia, unspecified: Secondary | ICD-10-CM | POA: Diagnosis not present

## 2020-01-22 DIAGNOSIS — E1140 Type 2 diabetes mellitus with diabetic neuropathy, unspecified: Secondary | ICD-10-CM | POA: Diagnosis not present

## 2020-01-22 DIAGNOSIS — Z4781 Encounter for orthopedic aftercare following surgical amputation: Secondary | ICD-10-CM | POA: Diagnosis not present

## 2020-01-27 DIAGNOSIS — Z4781 Encounter for orthopedic aftercare following surgical amputation: Secondary | ICD-10-CM | POA: Diagnosis not present

## 2020-01-27 DIAGNOSIS — K5904 Chronic idiopathic constipation: Secondary | ICD-10-CM | POA: Diagnosis not present

## 2020-01-27 DIAGNOSIS — E785 Hyperlipidemia, unspecified: Secondary | ICD-10-CM | POA: Diagnosis not present

## 2020-01-27 DIAGNOSIS — E1169 Type 2 diabetes mellitus with other specified complication: Secondary | ICD-10-CM | POA: Diagnosis not present

## 2020-01-27 DIAGNOSIS — I129 Hypertensive chronic kidney disease with stage 1 through stage 4 chronic kidney disease, or unspecified chronic kidney disease: Secondary | ICD-10-CM | POA: Diagnosis not present

## 2020-01-27 DIAGNOSIS — E1151 Type 2 diabetes mellitus with diabetic peripheral angiopathy without gangrene: Secondary | ICD-10-CM | POA: Diagnosis not present

## 2020-01-27 DIAGNOSIS — I2510 Atherosclerotic heart disease of native coronary artery without angina pectoris: Secondary | ICD-10-CM | POA: Diagnosis not present

## 2020-01-27 DIAGNOSIS — N182 Chronic kidney disease, stage 2 (mild): Secondary | ICD-10-CM | POA: Diagnosis not present

## 2020-01-27 DIAGNOSIS — E1122 Type 2 diabetes mellitus with diabetic chronic kidney disease: Secondary | ICD-10-CM | POA: Diagnosis not present

## 2020-01-27 DIAGNOSIS — E1140 Type 2 diabetes mellitus with diabetic neuropathy, unspecified: Secondary | ICD-10-CM | POA: Diagnosis not present

## 2020-02-15 DIAGNOSIS — I10 Essential (primary) hypertension: Secondary | ICD-10-CM | POA: Diagnosis not present

## 2020-03-16 DIAGNOSIS — E119 Type 2 diabetes mellitus without complications: Secondary | ICD-10-CM | POA: Diagnosis not present

## 2020-03-16 DIAGNOSIS — E7800 Pure hypercholesterolemia, unspecified: Secondary | ICD-10-CM | POA: Diagnosis not present

## 2020-03-16 DIAGNOSIS — I10 Essential (primary) hypertension: Secondary | ICD-10-CM | POA: Diagnosis not present

## 2020-03-17 DIAGNOSIS — E7849 Other hyperlipidemia: Secondary | ICD-10-CM | POA: Diagnosis not present

## 2020-03-17 DIAGNOSIS — I10 Essential (primary) hypertension: Secondary | ICD-10-CM | POA: Diagnosis not present

## 2020-03-17 DIAGNOSIS — N401 Enlarged prostate with lower urinary tract symptoms: Secondary | ICD-10-CM | POA: Diagnosis not present

## 2020-03-23 ENCOUNTER — Ambulatory Visit: Payer: Medicare HMO | Admitting: Urology

## 2020-03-23 NOTE — Progress Notes (Deleted)
Subjective:  No diagnosis found.   03/23/20: Alfred Miller returns today with the complaint of gross hematuria.  He has a history of Alfred Miller diagnosed in 10/17 and last had cystoscopy in 06/11/19.  06/11/19: Alfred Miller returns today in f/u for cystoscopy for f/u of his history of  Alfred Miller diagnosed in 10/17.   He has had no hematuria or new voiding complaints.   He denies any new medical problems.    His PSA was <0.1 with a testosterone of 64 in 1/21. He got his last Lupron 45mg  in 11/18 for adjuvant therapy with EXRT for his history of prostate cancer. He has had no bone pain or weight loss. He has no hot flashes. He is voiding ok but has some intermittency and urgency and is on oxybutynin. His IPSS is 5.  He had a clear urine on 04/02/19. He has had no hematuria.   GU hx: [name] is a transfer from Dr. Exie Parody. He was diagnosed with a high volume Gleason 7(4+3) prostate cancer in 5/17 with a PSA of 30. His stage was T2 N0 M0. He was started on ADT in 7/17. He has completed radiation therapy in the fall of 2017 and remains on adjuvant Lupron. He is due for an injection but will need a PA. He hasn't had a recent PSA. He has occasional hematuria and has been evaluated for that and was found to have a Low grade papillary lesion of the left lateral wall. He has stress incontinence when he stands. He has been on Vesicare and oxybutynin in the past.    ROS:  ROS:  A complete review of systems was performed.  All systems are negative except for pertinent findings as noted.   Review of Systems  All other systems reviewed and are negative.   Allergies  Allergen Reactions  . Augmentin [Amoxicillin-Pot Clavulanate] Diarrhea, Nausea Only and Other (See Comments)    Stomach pain and dizziness  . Cephalexin Diarrhea, Nausea Only and Other (See Comments)    Stomach pain and dizziness  . Ciprofloxacin     Patient states he is not allergic to it. Patient states he is not allergic to anything but according to [facility],  pt just had a reaction to cephalexin on 10/26/19 that he did not report to me when I asked.    Outpatient Encounter Medications as of 03/23/2020  Medication Sig  . amLODipine (NORVASC) 10 MG tablet Take 10 mg by mouth daily.   Marland Kitchen aspirin EC 81 MG tablet Take 81 mg by mouth daily. Reported on 10/06/2015  . atorvastatin (LIPITOR) 10 MG tablet Take 1 tablet (10 mg total) by mouth daily.  . carvedilol (COREG) 12.5 MG tablet Take 12.5 mg by mouth 2 (two) times daily.  . cloNIDine (CATAPRES) 0.2 MG tablet Take 0.2 mg by mouth 2 (two) times daily.  Marland Kitchen glipiZIDE (GLUCOTROL) 10 MG tablet Take 1 tablet by mouth Twice daily.  Marland Kitchen lisinopril (PRINIVIL,ZESTRIL) 40 MG tablet Take 40 mg by mouth daily.   Marland Kitchen NOVOLIN N RELION 100 UNIT/ML injection Inject 0.4 mLs (40 Units total) into the skin 2 (two) times daily before a meal.  . oxyCODONE-acetaminophen (PERCOCET/ROXICET) 5-325 MG tablet Take 1 tablet by mouth every 4 (four) hours as needed for moderate pain.  . polyethylene glycol (MIRALAX / GLYCOLAX) 17 g packet Take 17 g by mouth 2 (two) times daily.   No facility-administered encounter medications on file as of 03/23/2020.    Past Medical History:  Diagnosis Date  . Anginal pain (  HCC)   . Aortic root dilatation (HCC)    Aortic root 44 mm, echo, October 08, 2010  . Aortic valve sclerosis    No stenosis, echo, July, 2012  . BPH (benign prostatic hyperplasia)   . CAD (coronary artery disease)    Catheterization 2009, "some narrowing"  . Diabetes mellitus   . Dyspnea   . Ejection fraction    EF 60%,Echo, October 08, 2010  . Gout   . Hypertension     Past Surgical History:  Procedure Laterality Date  . AMPUTATION Left 11/04/2019   Procedure: LEFT BELOW KNEE AMPUTATION;  Surgeon: Franky Macho, MD;  Location: [facility];  Service: General;  Laterality: Left;  . CARDIAC CATHETERIZATION  2009  . CIRCUMCISION  09/2009    Social History   Socioeconomic History  . Marital status: Married    Spouse name: Not on  file  . Number of children: Not on file  . Years of education: Not on file  . Highest education level: Not on file  Occupational History  . Not on file  Tobacco Use  . Smoking status: Former Smoker    Packs/day: 0.30    Years: 40.00    Pack years: 12.00    Types: Cigarettes    Start date: 06/26/1968    Quit date: 03/18/2005    Years since quitting: 15.0  . Smokeless tobacco: Never Used  Vaping Use  . Vaping Use: Never used  Substance and Sexual Activity  . Alcohol use: Not Currently    Alcohol/week: 0.0 standard drinks  . Drug use: Not Currently  . Sexual activity: Not Currently  Other Topics Concern  . Not on file  Social History Narrative  . Not on file   Social Determinants of Health   Financial Resource Strain: Not on file  Food Insecurity: Not on file  Transportation Needs: Not on file  Physical Activity: Not on file  Stress: Not on file  Social Connections: Not on file  Intimate Partner Violence: Not on file    Family History  Problem Relation Age of Onset  . Hypertension Father   . Diabetes Father   . Hypertension Mother   . Diabetes Mother   . Breast cancer Mother        Objective: There were no vitals filed for this visit.   Physical Exam  Lab Results:  No results found for this or any previous visit (from the past 24 hour(s)).  BMET No results for input(s): NA, K, CL, CO2, GLUCOSE, BUN, CREATININE, CALCIUM in the last 72 hours. PSA PSA  Date Value Ref Range Status  03/31/2019 <0.1 < OR = 4.0 ng/mL Final    Comment:    The total PSA value from this assay system is  standardized against the WHO standard. The test  result will be approximately 20% lower when compared  to the equimolar-standardized total PSA (Beckman  Coulter). Comparison of serial PSA results should be  interpreted with this fact in mind. . This test was performed using the Siemens  chemiluminescent method. Values obtained from  different assay methods cannot be  used interchangeably. PSA levels, regardless of value, should not be interpreted as absolute evidence of the presence or absence of disease.    Testosterone  Date Value Ref Range Status  03/31/2019 64 (L) 250 - 827 ng/dL Final    Comment:    In hypogonadal males, Testosterone, Total, LC/MS/MS, is the recommended assay due to the diminished accuracy of immunoassay at levels below  250 ng/dL. This test code [phone]) must be collected in a red-top tube with no gel.        Studies/Results: No results found.  Procedure: Cystoscopy.  He was given rocephin 1gm IM, prepped with betadine, draped with sterile towels and the urethra was instilled with 2% lidocaine jelly.   The scope was advanced and the urethra was remarkable for a mild bulbar stricture.  The prostate is 3cm with trilobar hyperplasia and some obstruction.  The bladder wall has mild/mod trabeculation without mucosal lesions.  The UO's were not well visualized.   The scope was removed.  There were no complications.     Assessment & Plan: History of bladder cancer.   He will need cystoscopy in a year.  No recurrences seen.   History of prostate cancer.  He will need a PSA in 3 months with OV.  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  Testosterone level in 3 months.    Urgency.  Continue oxybutynin.  No orders of the defined types were placed in this encounter.    No orders of the defined types were placed in this encounter.     No follow-ups on file.   CC: Glenda Chroman, MD      Irine Seal 03/23/2020

## 2020-03-28 DIAGNOSIS — Z89512 Acquired absence of left leg below knee: Secondary | ICD-10-CM | POA: Diagnosis not present

## 2020-03-28 DIAGNOSIS — J069 Acute upper respiratory infection, unspecified: Secondary | ICD-10-CM | POA: Diagnosis not present

## 2020-03-28 DIAGNOSIS — I10 Essential (primary) hypertension: Secondary | ICD-10-CM | POA: Diagnosis not present

## 2020-03-28 DIAGNOSIS — E1165 Type 2 diabetes mellitus with hyperglycemia: Secondary | ICD-10-CM | POA: Diagnosis not present

## 2020-03-28 DIAGNOSIS — Z299 Encounter for prophylactic measures, unspecified: Secondary | ICD-10-CM | POA: Diagnosis not present

## 2020-03-30 ENCOUNTER — Other Ambulatory Visit: Payer: Self-pay

## 2020-03-30 ENCOUNTER — Encounter: Payer: Self-pay | Admitting: Urology

## 2020-03-30 ENCOUNTER — Ambulatory Visit (INDEPENDENT_AMBULATORY_CARE_PROVIDER_SITE_OTHER): Payer: Medicare HMO | Admitting: Urology

## 2020-03-30 VITALS — BP 125/74 | HR 60 | Temp 98.1°F | Ht 72.0 in | Wt 243.0 lb

## 2020-03-30 DIAGNOSIS — Z8546 Personal history of malignant neoplasm of prostate: Secondary | ICD-10-CM | POA: Diagnosis not present

## 2020-03-30 DIAGNOSIS — Z8551 Personal history of malignant neoplasm of bladder: Secondary | ICD-10-CM

## 2020-03-30 DIAGNOSIS — R310 Gross hematuria: Secondary | ICD-10-CM | POA: Diagnosis not present

## 2020-03-30 DIAGNOSIS — E291 Testicular hypofunction: Secondary | ICD-10-CM

## 2020-03-30 LAB — URINALYSIS, ROUTINE W REFLEX MICROSCOPIC
Bilirubin, UA: NEGATIVE
Glucose, UA: NEGATIVE
Ketones, UA: NEGATIVE
Nitrite, UA: NEGATIVE
Specific Gravity, UA: 1.02 (ref 1.005–1.030)
Urobilinogen, Ur: 0.2 mg/dL (ref 0.2–1.0)
pH, UA: 5 (ref 5.0–7.5)

## 2020-03-30 LAB — MICROSCOPIC EXAMINATION: Renal Epithel, UA: NONE SEEN /hpf

## 2020-03-30 NOTE — Progress Notes (Signed)
Urological Symptom Review  Patient is experiencing the following symptoms: Blood in urine   Review of Systems  Gastrointestinal (upper)  : Negative for upper GI symptoms  Gastrointestinal (lower) : Negative for lower GI symptoms  Constitutional : Negative for symptoms  Skin: Negative for skin symptoms  Eyes: Negative for eye symptoms  Ear/Nose/Throat : Negative for Ear/Nose/Throat symptoms  Hematologic/Lymphatic: Negative for Hematologic/Lymphatic symptoms  Cardiovascular : Negative for cardiovascular symptoms  Respiratory : Negative for respiratory symptoms  Endocrine: Excessive thirst  Musculoskeletal: Negative for musculoskeletal symptoms  Neurological: Negative for neurological symptoms  Psychologic: Negative for psychiatric symptoms

## 2020-03-30 NOTE — Progress Notes (Signed)
Subjective:  1. Gross hematuria   2. History of bladder cancer   3. History of prostate cancer   4. Hypogonadism in male     03/30/20: Alfred Miller returns today witht he complaint of gross hematuria over the last 2 weeks.  He has had no pain.  He has no dysuria.   He has a history of LG NMIBC diagnosed in 10/17 and is overdue for cystoscopy.   He has a history of prostate cancer and was treated with EXRT and adjuvant ADT with the last Lupron in 11/18.  His PSA was <0.1 with a T of 64 in  1/21.   He is overdue for f/u.  He has no difficulty voiding but he has some urgency with UUI.  He hasn't lost weight and has no bone pain.   He has no flank pain.  He has a history of diabetes and had a LBKA for gangrene.   He was started on augmentin yesterday.   His UA today has 11-30 WBC and RBC with a few bacteria.   1/21: Alfred Miller returns today in f/u for cystoscopy for f/u of his history of  Alfred Miller diagnosed in 10/17.   He has had no hematuria or new voiding complaints.   He denies any new medical problems.    His PSA was <0.1 with a testosterone of 64 in 1/21. He got his last Lupron 45mg  in 11/18 for adjuvant therapy with EXRT for his history of prostate cancer. He has had no bone pain or weight loss. He has no hot flashes. He is voiding ok but has some intermittency and urgency and is on oxybutynin. His IPSS is 5.  He had a clear urine on 04/02/19. He has had no hematuria.   GU hx: Alfred Miller is a transfer from Alfred Miller. He was diagnosed with a high volume Gleason 7(4+3) prostate cancer in 5/17 with a PSA of 30. His stage was T2 N0 M0. He was started on ADT in 7/17. He has completed radiation therapy in the fall of 2017 and remains on adjuvant Lupron. He is due for an injection but will need a PA. He hasn't had a recent PSA. He has occasional hematuria and has been evaluated for that and was found to have a Low grade papillary lesion of the left lateral wall. He has stress incontinence when he stands. He has been on  Vesicare and oxybutynin in the past.    ROS:  ROS:  A complete review of systems was performed.  All systems are negative except for pertinent findings as noted.   Review of Systems  All other systems reviewed and are negative.   Allergies  Allergen Reactions  . Augmentin [Amoxicillin-Pot Clavulanate] Diarrhea, Nausea Only and Other (See Comments)    Stomach pain and dizziness  . Cephalexin Diarrhea, Nausea Only and Other (See Comments)    Stomach pain and dizziness  . Ciprofloxacin     Patient states he is not allergic to it. Patient states he is not allergic to anything but according to [facility], pt just had a reaction to cephalexin on 10/26/19 that he did not report to me when I asked.    Outpatient Encounter Medications as of 03/30/2020  Medication Sig  . amLODipine (NORVASC) 10 MG tablet Take 10 mg by mouth daily.   Marland Kitchen amoxicillin-clavulanate (AUGMENTIN) 875-125 MG tablet Take 1 tablet by mouth 2 (two) times daily.  Marland Kitchen aspirin EC 81 MG tablet Take 81 mg by mouth daily. Reported on 10/06/2015  .  atorvastatin (LIPITOR) 10 MG tablet Take 1 tablet (10 mg total) by mouth daily.  . carvedilol (COREG) 12.5 MG tablet Take 12.5 mg by mouth 2 (two) times daily.  . cloNIDine (CATAPRES) 0.2 MG tablet Take 0.2 mg by mouth 2 (two) times daily.  Marland Kitchen glipiZIDE (GLUCOTROL) 10 MG tablet Take 1 tablet by mouth Twice daily.  Marland Kitchen lisinopril (PRINIVIL,ZESTRIL) 40 MG tablet Take 40 mg by mouth daily.   . metFORMIN (GLUCOPHAGE) 500 MG tablet Take 1,000 mg by mouth 2 (two) times daily.  Marland Kitchen NOVOLIN N RELION 100 UNIT/ML injection Inject 0.4 mLs (40 Units total) into the skin 2 (two) times daily before a meal.  . polyethylene glycol (MIRALAX / GLYCOLAX) 17 g packet Take 17 g by mouth 2 (two) times daily.  Marland Kitchen oxyCODONE-acetaminophen (PERCOCET/ROXICET) 5-325 MG tablet Take 1 tablet by mouth every 4 (four) hours as needed for moderate pain.  . [DISCONTINUED] pantoprazole (PROTONIX) 40 MG tablet Take 40 mg by mouth  every morning. (Patient not taking: Reported on 03/30/2020)   No facility-administered encounter medications on file as of 03/30/2020.    Past Medical History:  Diagnosis Date  . Anginal pain ([place])   . Aortic root dilatation (HCC)    Aortic root 44 mm, echo, October 08, 2010  . Aortic valve sclerosis    No stenosis, echo, July, 2012  . BPH (benign prostatic hyperplasia)   . CAD (coronary artery disease)    Catheterization 2009, "some narrowing"  . Diabetes mellitus   . Dyspnea   . Ejection fraction    EF 60%,Echo, October 08, 2010  . Gout   . Hypertension     Past Surgical History:  Procedure Laterality Date  . AMPUTATION Left 11/04/2019   Procedure: LEFT BELOW KNEE AMPUTATION;  Surgeon: Alfred Signs, MD;  Location: [facility];  Service: General;  Laterality: Left;  . CARDIAC CATHETERIZATION  2009  . CIRCUMCISION  09/2009    Social History   Socioeconomic History  . Marital status: Married    Spouse name: Not on file  . Number of children: Not on file  . Years of education: Not on file  . Highest education level: Not on file  Occupational History  . Not on file  Tobacco Use  . Smoking status: Former Smoker    Packs/day: 0.30    Years: 40.00    Pack years: 12.00    Types: Cigarettes    Start date: 06/26/1968    Quit date: 03/18/2005    Years since quitting: 15.0  . Smokeless tobacco: Never Used  Vaping Use  . Vaping Use: Never used  Substance and Sexual Activity  . Alcohol use: Not Currently    Alcohol/week: 0.0 standard drinks  . Drug use: Not Currently  . Sexual activity: Not Currently  Other Topics Concern  . Not on file  Social History Narrative  . Not on file   Social Determinants of Health   Financial Resource Strain: Not on file  Food Insecurity: Not on file  Transportation Needs: Not on file  Physical Activity: Not on file  Stress: Not on file  Social Connections: Not on file  Intimate Partner Violence: Not on file    Family History  Problem Relation  Age of Onset  . Hypertension Father   . Diabetes Father   . Hypertension Mother   . Diabetes Mother   . Breast cancer Mother        Objective: Vitals:   03/30/20 0923  BP: 125/74  Pulse:  60  Temp: 98.1 F (36.7 C)     Physical Exam Vitals reviewed.  Constitutional:      Appearance: Normal appearance. He is obese.  Chest:  Breasts:     Right: No supraclavicular adenopathy.     Left: No supraclavicular adenopathy.    Lymphadenopathy:     Upper Body:     Right upper body: No supraclavicular adenopathy.     Left upper body: No supraclavicular adenopathy.  Neurological:     Mental Status: He is alert.     Lab Results:  Results for orders placed or performed in visit on 03/30/20 (from the past 24 hour(s))  Urinalysis, Routine w reflex microscopic     Status: Abnormal   Collection Time: 03/30/20  9:19 AM  Result Value Ref Range   Specific Gravity, UA 1.020 1.005 - 1.030   pH, UA 5.0 5.0 - 7.5   Color, UA Amber (A) Yellow   Appearance Ur Hazy (A) Clear   Leukocytes,UA 1+ (A) Negative   Protein,UA 1+ (A) Negative/Trace   Glucose, UA Negative Negative   Ketones, UA Negative Negative   RBC, UA 1+ (A) Negative   Bilirubin, UA Negative Negative   Urobilinogen, Ur 0.2 0.2 - 1.0 mg/dL   Nitrite, UA Negative Negative   Microscopic Examination See below:    Narrative   Performed at:  [facility] [address], [place], [place]  [id number] Lab Director: Mina Marble MT, Phone:  [phone]  Microscopic Examination     Status: Abnormal   Collection Time: 03/30/20  9:19 AM   Urine  Result Value Ref Range   WBC, UA 11-30 (A) 0 - 5 /hpf   RBC 11-30 (A) 0 - 2 /hpf   Epithelial Cells (non renal) 0-10 0 - 10 /hpf   Renal Epithel, UA None seen None seen /hpf   Mucus, UA Present Not Estab.   Bacteria, UA Few (A) None seen/Few   Narrative   Performed at:  [facility] [address] [place], [place]  [id number] Lab Director: Ferney, Phone:  [phone]    BMET No results for input(s): NA, K, CL, CO2, GLUCOSE, BUN, CREATININE, CALCIUM in the last 72 hours. PSA PSA  Date Value Ref Range Status  03/31/2019 <0.1 < OR = 4.0 ng/mL Final    Comment:    The total PSA value from this assay system is  standardized against the WHO standard. The test  result will be approximately 20% lower when compared  to the equimolar-standardized total PSA (Beckman  Coulter). Comparison of serial PSA results should be  interpreted with this fact in mind. . This test was performed using the Siemens  chemiluminescent method. Values obtained from  different assay methods cannot be used interchangeably. PSA levels, regardless of value, should not be interpreted as absolute evidence of the presence or absence of disease.    Testosterone  Date Value Ref Range Status  03/31/2019 64 (L) 250 - 827 ng/dL Final    Comment:    In hypogonadal males, Testosterone, Total, LC/MS/MS, is the recommended assay due to the diminished accuracy of immunoassay at levels below 250 ng/dL. This test code [phone]) must be collected in a red-top tube with no gel.        Studies/Results:  His Cr was 0.73 in 11/05/19.   UA reviewed.  Assessment & Plan: History of bladder cancer.  He has recurrent gross hematuria and will need a CT hematuria study  and f/u for cystoscopy.    History of prostate cancer.  He needs a PSA and testosterone.   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      Urgency.  He is off of the oxybutynin.  No orders of the defined types were placed in this encounter.    Orders Placed This Encounter  Procedures  . Urine Culture    On amoxicillin  . Microscopic Examination  . CT HEMATURIA WORKUP    Standing Status:   Future    Standing Expiration Date:   04/30/2020    Order Specific Question:   Reason for Exam (SYMPTOM  OR DIAGNOSIS REQUIRED)    Answer:   gross hematuria    Order Specific Question:   Preferred imaging location?     Answer:   [facility]    Order Specific Question:   Radiology Contrast Protocol - do NOT remove file path    Answer:   [email or web address]  . Urinalysis, Routine w reflex microscopic  . CBC  . Basic metabolic panel  . PSA  . Testosterone      Return in about 2 weeks (around 04/13/2020) for F/U with CT results for cystoscopy. .   CC: Glenda Chroman, MD      Irine Seal 03/30/2020

## 2020-03-31 ENCOUNTER — Telehealth: Payer: Self-pay

## 2020-03-31 LAB — BASIC METABOLIC PANEL
BUN/Creatinine Ratio: 14 (ref 10–24)
BUN: 13 mg/dL (ref 8–27)
CO2: 27 mmol/L (ref 20–29)
Calcium: 9.4 mg/dL (ref 8.6–10.2)
Chloride: 101 mmol/L (ref 96–106)
Creatinine, Ser: 0.93 mg/dL (ref 0.76–1.27)
GFR calc Af Amer: 96 mL/min/{1.73_m2} (ref >59)
GFR calc non Af Amer: 83 mL/min/{1.73_m2} (ref >59)
Glucose: 104 mg/dL — ABNORMAL HIGH (ref 65–99)
Potassium: 4.1 mmol/L (ref 3.5–5.2)
Sodium: 142 mmol/L (ref 134–144)

## 2020-03-31 LAB — TESTOSTERONE: Testosterone: 222 ng/dL — ABNORMAL LOW (ref 264–916)

## 2020-03-31 LAB — CBC
Hematocrit: 38.6 % (ref 37.5–51.0)
Hemoglobin: 12.7 g/dL — ABNORMAL LOW (ref 13.0–17.7)
MCH: 27.7 pg (ref 26.6–33.0)
MCHC: 32.9 g/dL (ref 31.5–35.7)
MCV: 84 fL (ref 79–97)
Platelets: 315 10*3/uL (ref 150–450)
RBC: 4.58 x10E6/uL (ref 4.14–5.80)
RDW: 14.4 % (ref 11.6–15.4)
WBC: 8.4 10*3/uL (ref 3.4–10.8)

## 2020-03-31 LAB — PSA: Prostate Specific Ag, Serum: <0.1 ng/mL (ref 0.0–4.0)

## 2020-03-31 NOTE — Progress Notes (Signed)
CBC and BMP are ok.  The PSA remains undetectible and the testosterone is up to 222 from 64.

## 2020-03-31 NOTE — Telephone Encounter (Signed)
-----   Message from Irine Seal, MD sent at 03/31/2020 11:42 AM EST ----- CBC and BMP are ok.  The PSA remains undetectible and the testosterone is up to 222 from 64.

## 2020-03-31 NOTE — Telephone Encounter (Signed)
Pts mom notified of pts blood work results.

## 2020-04-01 LAB — CYTOLOGY - NON PAP

## 2020-04-02 LAB — URINE CULTURE: Organism ID, Bacteria: NO GROWTH

## 2020-04-03 ENCOUNTER — Telehealth: Payer: Self-pay

## 2020-04-03 NOTE — Telephone Encounter (Signed)
Pt notified of results

## 2020-04-03 NOTE — Telephone Encounter (Signed)
-----   Message from Irine Seal, MD sent at 04/03/2020  9:56 AM EST ----- Culture and cytology both negative.   F/u as planned.

## 2020-04-06 ENCOUNTER — Ambulatory Visit: Payer: Medicare HMO | Admitting: General Surgery

## 2020-04-06 ENCOUNTER — Ambulatory Visit: Payer: Medicare HMO | Admitting: Urology

## 2020-04-06 NOTE — Addendum Note (Signed)
Addended by: Dorisann Frames on: 04/06/2020 09:44 AM   Modules accepted: Orders

## 2020-04-07 ENCOUNTER — Ambulatory Visit: Payer: Medicare HMO | Admitting: Urology

## 2020-04-10 ENCOUNTER — Ambulatory Visit (HOSPITAL_COMMUNITY): Payer: Medicare HMO

## 2020-04-13 ENCOUNTER — Other Ambulatory Visit: Payer: Medicare HMO | Admitting: Urology

## 2020-04-17 ENCOUNTER — Telehealth: Payer: Self-pay

## 2020-04-17 DIAGNOSIS — E7849 Other hyperlipidemia: Secondary | ICD-10-CM | POA: Diagnosis not present

## 2020-04-17 DIAGNOSIS — I10 Essential (primary) hypertension: Secondary | ICD-10-CM | POA: Diagnosis not present

## 2020-04-17 DIAGNOSIS — N401 Enlarged prostate with lower urinary tract symptoms: Secondary | ICD-10-CM | POA: Diagnosis not present

## 2020-04-18 DIAGNOSIS — E109 Type 1 diabetes mellitus without complications: Secondary | ICD-10-CM | POA: Diagnosis not present

## 2020-04-18 DIAGNOSIS — H2513 Age-related nuclear cataract, bilateral: Secondary | ICD-10-CM | POA: Diagnosis not present

## 2020-04-18 DIAGNOSIS — H524 Presbyopia: Secondary | ICD-10-CM | POA: Diagnosis not present

## 2020-04-18 DIAGNOSIS — H52223 Regular astigmatism, bilateral: Secondary | ICD-10-CM | POA: Diagnosis not present

## 2020-04-18 DIAGNOSIS — E103313 Type 1 diabetes mellitus with moderate nonproliferative diabetic retinopathy with macular edema, bilateral: Secondary | ICD-10-CM | POA: Diagnosis not present

## 2020-04-18 DIAGNOSIS — H5203 Hypermetropia, bilateral: Secondary | ICD-10-CM | POA: Diagnosis not present

## 2020-04-19 NOTE — Telephone Encounter (Signed)
I think he could take his BP med with a sip.

## 2020-04-19 NOTE — Telephone Encounter (Signed)
Have attempted to call pt 2x today. Get a busy signal each time.

## 2020-04-20 NOTE — Telephone Encounter (Signed)
Pt's wife notified.

## 2020-05-03 ENCOUNTER — Encounter (INDEPENDENT_AMBULATORY_CARE_PROVIDER_SITE_OTHER): Payer: Medicare HMO | Admitting: Ophthalmology

## 2020-05-03 ENCOUNTER — Other Ambulatory Visit: Payer: Self-pay

## 2020-05-03 DIAGNOSIS — E103313 Type 1 diabetes mellitus with moderate nonproliferative diabetic retinopathy with macular edema, bilateral: Secondary | ICD-10-CM | POA: Diagnosis not present

## 2020-05-03 DIAGNOSIS — H35033 Hypertensive retinopathy, bilateral: Secondary | ICD-10-CM | POA: Diagnosis not present

## 2020-05-03 DIAGNOSIS — I10 Essential (primary) hypertension: Secondary | ICD-10-CM | POA: Diagnosis not present

## 2020-05-03 DIAGNOSIS — H43813 Vitreous degeneration, bilateral: Secondary | ICD-10-CM

## 2020-05-11 ENCOUNTER — Other Ambulatory Visit: Payer: Self-pay

## 2020-05-11 ENCOUNTER — Ambulatory Visit (INDEPENDENT_AMBULATORY_CARE_PROVIDER_SITE_OTHER): Payer: Medicare HMO | Admitting: General Surgery

## 2020-05-11 ENCOUNTER — Encounter: Payer: Self-pay | Admitting: General Surgery

## 2020-05-11 VITALS — BP 185/104 | HR 65 | Temp 97.2°F | Resp 14 | Ht 74.0 in | Wt 250.0 lb

## 2020-05-11 DIAGNOSIS — Z89512 Acquired absence of left leg below knee: Secondary | ICD-10-CM | POA: Diagnosis not present

## 2020-05-11 DIAGNOSIS — Z09 Encounter for follow-up examination after completed treatment for conditions other than malignant neoplasm: Secondary | ICD-10-CM

## 2020-05-11 NOTE — Progress Notes (Signed)
Subjective:     Alfred Miller  Here for follow-up, status post left BKA.  Patient is doing very well.  Has no complaints. Objective:    BP (!) 185/104   Pulse 65   Temp (!) 97.2 F (36.2 C) (Other (Comment))   Resp 14   Ht 6\' 2"  (1.88 m)   Wt 250 lb (113.4 kg)   SpO2 95%   BMI 32.10 kg/m   General:  alert, cooperative and no distress  Left BKA well-healed.     Assessment:    Doing well postoperatively.    Plan:   Will refer for left BKA prosthesis.  Have also signed a handicap sticker.  Follow-up as needed.

## 2020-05-12 ENCOUNTER — Telehealth: Payer: Self-pay | Admitting: General Surgery

## 2020-05-12 ENCOUNTER — Ambulatory Visit (HOSPITAL_COMMUNITY): Admission: RE | Admit: 2020-05-12 | Payer: Medicare HMO | Source: Ambulatory Visit

## 2020-05-12 NOTE — Telephone Encounter (Signed)
Faxed demographics, office note and RX for prosthesis to [employer] at [phone]. Received confirmation that fax was received.

## 2020-05-15 DIAGNOSIS — N401 Enlarged prostate with lower urinary tract symptoms: Secondary | ICD-10-CM | POA: Diagnosis not present

## 2020-05-15 DIAGNOSIS — I10 Essential (primary) hypertension: Secondary | ICD-10-CM | POA: Diagnosis not present

## 2020-05-15 DIAGNOSIS — E7849 Other hyperlipidemia: Secondary | ICD-10-CM | POA: Diagnosis not present

## 2020-05-17 NOTE — Progress Notes (Deleted)
Subjective:  1. History of bladder cancer     03/30/20: Alfred Miller returns today witht he complaint of gross hematuria over the last 2 weeks.  He has had no pain.  He has no dysuria.   He has a history of LG NMIBC diagnosed in 10/17 and is overdue for cystoscopy.   He has a history of prostate cancer and was treated with EXRT and adjuvant ADT with the last Lupron in 11/18.  His PSA was <0.1 with a T of 64 in  1/21.   He is overdue for f/u.  He has no difficulty voiding but he has some urgency with UUI.  He hasn't lost weight and has no bone pain.   He has no flank pain.  He has a history of diabetes and had a LBKA for gangrene.   He was started on augmentin yesterday.   His UA today has 11-30 WBC and RBC with a few bacteria.   1/21: Alfred Miller returns today in f/u for cystoscopy for f/u of his history of  [place] diagnosed in 10/17.   He has had no hematuria or new voiding complaints.   He denies any new medical problems.    His PSA was <0.1 with a testosterone of 64 in 1/21. He got his last Lupron 45mg  in 11/18 for adjuvant therapy with EXRT for his history of prostate cancer. He has had no bone pain or weight loss. He has no hot flashes. He is voiding ok but has some intermittency and urgency and is on oxybutynin. His IPSS is 5.  He had a clear urine on 04/02/19. He has had no hematuria.   GU hx: Alfred Miller is a transfer from Alfred Miller. He was diagnosed with a high volume Gleason 7(4+3) prostate cancer in 5/17 with a PSA of 30. His stage was T2 N0 M0. He was started on ADT in 7/17. He has completed radiation therapy in the fall of 2017 and remains on adjuvant Lupron. He is due for an injection but will need a PA. He hasn't had a recent PSA. He has occasional hematuria and has been evaluated for that and was found to have a Low grade papillary lesion of the left lateral wall. He has stress incontinence when he stands. He has been on Vesicare and oxybutynin in the past.    ROS:  ROS:  A complete review of  systems was performed.  All systems are negative except for pertinent findings as noted.   Review of Systems  All other systems reviewed and are negative.   Allergies  Allergen Reactions  . Augmentin [Amoxicillin-Pot Clavulanate] Diarrhea, Nausea Only and Other (See Comments)    Stomach pain and dizziness  . Cephalexin Diarrhea, Nausea Only and Other (See Comments)    Stomach pain and dizziness  . Ciprofloxacin     Patient states he is not allergic to it. Patient states he is not allergic to anything but according to [facility], pt just had a reaction to cephalexin on 10/26/19 that he did not report to me when I asked.    Outpatient Encounter Medications as of 05/18/2020  Medication Sig  . amLODipine (NORVASC) 10 MG tablet Take 10 mg by mouth daily.   Marland Kitchen aspirin EC 81 MG tablet Take 81 mg by mouth daily. Reported on 10/06/2015  . atorvastatin (LIPITOR) 10 MG tablet Take 1 tablet (10 mg total) by mouth daily.  . carvedilol (COREG) 12.5 MG tablet Take 12.5 mg by mouth 2 (two) times daily.  Marland Kitchen  cloNIDine (CATAPRES) 0.2 MG tablet Take 0.2 mg by mouth 2 (two) times daily.  Marland Kitchen glipiZIDE (GLUCOTROL) 10 MG tablet Take 1 tablet by mouth Twice daily.  Marland Kitchen lisinopril (PRINIVIL,ZESTRIL) 40 MG tablet Take 40 mg by mouth daily.   . metFORMIN (GLUCOPHAGE) 500 MG tablet Take 1,000 mg by mouth 2 (two) times daily.  Marland Kitchen NOVOLIN N RELION 100 UNIT/ML injection Inject 0.4 mLs (40 Units total) into the skin 2 (two) times daily before a meal.  . oxyCODONE-acetaminophen (PERCOCET/ROXICET) 5-325 MG tablet Take 1 tablet by mouth every 4 (four) hours as needed for moderate pain.  . polyethylene glycol (MIRALAX / GLYCOLAX) 17 g packet Take 17 g by mouth 2 (two) times daily.   No facility-administered encounter medications on file as of 05/18/2020.    Past Medical History:  Diagnosis Date  . Anginal pain ([place])   . Aortic root dilatation ([facility])    Aortic root 44 mm, echo, October 08, 2010  . Aortic valve sclerosis    No  stenosis, echo, July, 2012  . BPH (benign prostatic hyperplasia)   . CAD (coronary artery disease)    Catheterization 2009, "some narrowing"  . Diabetes mellitus   . Dyspnea   . Ejection fraction    EF 60%,Echo, October 08, 2010  . Gout   . Hypertension     Past Surgical History:  Procedure Laterality Date  . AMPUTATION Left 11/04/2019   Procedure: LEFT BELOW KNEE AMPUTATION;  Surgeon: Alfred Signs, MD;  Location: [facility];  Service: General;  Laterality: Left;  . CARDIAC CATHETERIZATION  2009  . CIRCUMCISION  09/2009    Social History   Socioeconomic History  . Marital status: Married    Spouse name: Not on file  . Number of children: Not on file  . Years of education: Not on file  . Highest education level: Not on file  Occupational History  . Not on file  Tobacco Use  . Smoking status: Former Smoker    Packs/day: 0.30    Years: 40.00    Pack years: 12.00    Types: Cigarettes    Start date: 06/26/1968    Quit date: 03/18/2005    Years since quitting: 15.1  . Smokeless tobacco: Never Used  Vaping Use  . Vaping Use: Never used  Substance and Sexual Activity  . Alcohol use: Not Currently    Alcohol/week: 0.0 standard drinks  . Drug use: Not Currently  . Sexual activity: Not Currently  Other Topics Concern  . Not on file  Social History Narrative  . Not on file   Social Determinants of Health   Financial Resource Strain: Not on file  Food Insecurity: Not on file  Transportation Needs: Not on file  Physical Activity: Not on file  Stress: Not on file  Social Connections: Not on file  Intimate Partner Violence: Not on file    Family History  Problem Relation Age of Onset  . Hypertension Father   . Diabetes Father   . Hypertension Mother   . Diabetes Mother   . Breast cancer Mother        Objective: There were no vitals filed for this visit.   Physical Exam Vitals reviewed.  Constitutional:      Appearance: Normal appearance. He is obese.  Chest:   Breasts:     Right: No supraclavicular adenopathy.     Left: No supraclavicular adenopathy.    Lymphadenopathy:     Upper Body:     Right upper  body: No supraclavicular adenopathy.     Left upper body: No supraclavicular adenopathy.  Neurological:     Mental Status: He is alert.     Lab Results:  No results found for this or any previous visit (from the past 24 hour(s)).  BMET No results for input(s): NA, K, CL, CO2, GLUCOSE, BUN, CREATININE, CALCIUM in the last 72 hours. PSA PSA  Date Value Ref Range Status  03/31/2019 <0.1 < OR = 4.0 ng/mL Final    Comment:    The total PSA value from this assay system is  standardized against the WHO standard. The test  result will be approximately 20% lower when compared  to the equimolar-standardized total PSA (Beckman  Coulter). Comparison of serial PSA results should be  interpreted with this fact in mind. . This test was performed using the Siemens  chemiluminescent method. Values obtained from  different assay methods cannot be used interchangeably. PSA levels, regardless of value, should not be interpreted as absolute evidence of the presence or absence of disease.    Testosterone  Date Value Ref Range Status  03/30/2020 222 (L) 264 - 916 ng/dL Final    Comment:    Adult male reference interval is based on a population of healthy nonobese males (BMI <30) between 41 and 43 years old. [place], [place] [phone]. PMID: [id number].   03/31/2019 64 (L) 250 - 827 ng/dL Final    Comment:    In hypogonadal males, Testosterone, Total, LC/MS/MS, is the recommended assay due to the diminished accuracy of immunoassay at levels below 250 ng/dL. This test code [id number]) must be collected in a red-top tube with no gel.        Studies/Results:  His Cr was 0.73 in 11/05/19.   UA reviewed.  Assessment & Plan: History of bladder cancer.  He has recurrent gross hematuria and will need a CT hematuria study and f/u for  cystoscopy.    History of prostate cancer.  He needs a PSA and testosterone.   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      Urgency.  He is off of the oxybutynin.  No orders of the defined types were placed in this encounter.    No orders of the defined types were placed in this encounter.     No follow-ups on file.   CC: Glenda Chroman, MD      Irine Seal 05/17/2020

## 2020-05-18 ENCOUNTER — Other Ambulatory Visit: Payer: Medicare HMO | Admitting: Urology

## 2020-05-18 DIAGNOSIS — Z8551 Personal history of malignant neoplasm of bladder: Secondary | ICD-10-CM

## 2020-06-15 DIAGNOSIS — I10 Essential (primary) hypertension: Secondary | ICD-10-CM | POA: Diagnosis not present

## 2020-07-14 DIAGNOSIS — I10 Essential (primary) hypertension: Secondary | ICD-10-CM | POA: Diagnosis not present

## 2020-07-15 DIAGNOSIS — N401 Enlarged prostate with lower urinary tract symptoms: Secondary | ICD-10-CM | POA: Diagnosis not present

## 2020-07-15 DIAGNOSIS — E7849 Other hyperlipidemia: Secondary | ICD-10-CM | POA: Diagnosis not present

## 2020-07-15 DIAGNOSIS — I10 Essential (primary) hypertension: Secondary | ICD-10-CM | POA: Diagnosis not present

## 2020-07-31 DIAGNOSIS — E1165 Type 2 diabetes mellitus with hyperglycemia: Secondary | ICD-10-CM | POA: Diagnosis not present

## 2020-07-31 DIAGNOSIS — I77810 Thoracic aortic ectasia: Secondary | ICD-10-CM | POA: Diagnosis not present

## 2020-07-31 DIAGNOSIS — Z6834 Body mass index (BMI) 34.0-34.9, adult: Secondary | ICD-10-CM | POA: Diagnosis not present

## 2020-07-31 DIAGNOSIS — I10 Essential (primary) hypertension: Secondary | ICD-10-CM | POA: Diagnosis not present

## 2020-07-31 DIAGNOSIS — L97309 Non-pressure chronic ulcer of unspecified ankle with unspecified severity: Secondary | ICD-10-CM | POA: Diagnosis not present

## 2020-07-31 DIAGNOSIS — Z299 Encounter for prophylactic measures, unspecified: Secondary | ICD-10-CM | POA: Diagnosis not present

## 2020-08-11 DIAGNOSIS — E1151 Type 2 diabetes mellitus with diabetic peripheral angiopathy without gangrene: Secondary | ICD-10-CM | POA: Diagnosis not present

## 2020-08-11 DIAGNOSIS — I2510 Atherosclerotic heart disease of native coronary artery without angina pectoris: Secondary | ICD-10-CM | POA: Diagnosis not present

## 2020-08-11 DIAGNOSIS — L84 Corns and callosities: Secondary | ICD-10-CM | POA: Diagnosis not present

## 2020-08-11 DIAGNOSIS — E11622 Type 2 diabetes mellitus with other skin ulcer: Secondary | ICD-10-CM | POA: Diagnosis not present

## 2020-08-11 DIAGNOSIS — E7800 Pure hypercholesterolemia, unspecified: Secondary | ICD-10-CM | POA: Diagnosis not present

## 2020-08-11 DIAGNOSIS — L97319 Non-pressure chronic ulcer of right ankle with unspecified severity: Secondary | ICD-10-CM | POA: Diagnosis not present

## 2020-08-11 DIAGNOSIS — E1140 Type 2 diabetes mellitus with diabetic neuropathy, unspecified: Secondary | ICD-10-CM | POA: Diagnosis not present

## 2020-08-11 DIAGNOSIS — L89513 Pressure ulcer of right ankle, stage 3: Secondary | ICD-10-CM | POA: Diagnosis not present

## 2020-08-11 DIAGNOSIS — Z89512 Acquired absence of left leg below knee: Secondary | ICD-10-CM | POA: Diagnosis not present

## 2020-08-11 DIAGNOSIS — I87311 Chronic venous hypertension (idiopathic) with ulcer of right lower extremity: Secondary | ICD-10-CM | POA: Diagnosis not present

## 2020-08-11 DIAGNOSIS — L853 Xerosis cutis: Secondary | ICD-10-CM | POA: Diagnosis not present

## 2020-08-11 DIAGNOSIS — I10 Essential (primary) hypertension: Secondary | ICD-10-CM | POA: Diagnosis not present

## 2020-08-25 DIAGNOSIS — I10 Essential (primary) hypertension: Secondary | ICD-10-CM | POA: Diagnosis not present

## 2020-08-25 DIAGNOSIS — L84 Corns and callosities: Secondary | ICD-10-CM | POA: Diagnosis not present

## 2020-08-25 DIAGNOSIS — E1140 Type 2 diabetes mellitus with diabetic neuropathy, unspecified: Secondary | ICD-10-CM | POA: Diagnosis not present

## 2020-08-25 DIAGNOSIS — Z89512 Acquired absence of left leg below knee: Secondary | ICD-10-CM | POA: Diagnosis not present

## 2020-08-25 DIAGNOSIS — E11622 Type 2 diabetes mellitus with other skin ulcer: Secondary | ICD-10-CM | POA: Diagnosis not present

## 2020-08-25 DIAGNOSIS — E1151 Type 2 diabetes mellitus with diabetic peripheral angiopathy without gangrene: Secondary | ICD-10-CM | POA: Diagnosis not present

## 2020-08-25 DIAGNOSIS — I2510 Atherosclerotic heart disease of native coronary artery without angina pectoris: Secondary | ICD-10-CM | POA: Diagnosis not present

## 2020-08-25 DIAGNOSIS — L97319 Non-pressure chronic ulcer of right ankle with unspecified severity: Secondary | ICD-10-CM | POA: Diagnosis not present

## 2020-08-25 DIAGNOSIS — L853 Xerosis cutis: Secondary | ICD-10-CM | POA: Diagnosis not present

## 2020-08-25 DIAGNOSIS — E7800 Pure hypercholesterolemia, unspecified: Secondary | ICD-10-CM | POA: Diagnosis not present

## 2020-08-29 DIAGNOSIS — R5383 Other fatigue: Secondary | ICD-10-CM | POA: Diagnosis not present

## 2020-08-29 DIAGNOSIS — Z6833 Body mass index (BMI) 33.0-33.9, adult: Secondary | ICD-10-CM | POA: Diagnosis not present

## 2020-08-29 DIAGNOSIS — Z0000 Encounter for general adult medical examination without abnormal findings: Secondary | ICD-10-CM | POA: Diagnosis not present

## 2020-08-29 DIAGNOSIS — Z79899 Other long term (current) drug therapy: Secondary | ICD-10-CM | POA: Diagnosis not present

## 2020-08-29 DIAGNOSIS — Z125 Encounter for screening for malignant neoplasm of prostate: Secondary | ICD-10-CM | POA: Diagnosis not present

## 2020-08-29 DIAGNOSIS — Z1331 Encounter for screening for depression: Secondary | ICD-10-CM | POA: Diagnosis not present

## 2020-08-29 DIAGNOSIS — Z299 Encounter for prophylactic measures, unspecified: Secondary | ICD-10-CM | POA: Diagnosis not present

## 2020-08-29 DIAGNOSIS — Z7189 Other specified counseling: Secondary | ICD-10-CM | POA: Diagnosis not present

## 2020-08-29 DIAGNOSIS — Z1339 Encounter for screening examination for other mental health and behavioral disorders: Secondary | ICD-10-CM | POA: Diagnosis not present

## 2020-08-29 DIAGNOSIS — I10 Essential (primary) hypertension: Secondary | ICD-10-CM | POA: Diagnosis not present

## 2020-08-29 DIAGNOSIS — E7800 Pure hypercholesterolemia, unspecified: Secondary | ICD-10-CM | POA: Diagnosis not present

## 2020-08-29 DIAGNOSIS — E1165 Type 2 diabetes mellitus with hyperglycemia: Secondary | ICD-10-CM | POA: Diagnosis not present

## 2020-09-05 DIAGNOSIS — L84 Corns and callosities: Secondary | ICD-10-CM | POA: Diagnosis not present

## 2020-09-05 DIAGNOSIS — E1140 Type 2 diabetes mellitus with diabetic neuropathy, unspecified: Secondary | ICD-10-CM | POA: Diagnosis not present

## 2020-09-05 DIAGNOSIS — E1151 Type 2 diabetes mellitus with diabetic peripheral angiopathy without gangrene: Secondary | ICD-10-CM | POA: Diagnosis not present

## 2020-09-05 DIAGNOSIS — L97319 Non-pressure chronic ulcer of right ankle with unspecified severity: Secondary | ICD-10-CM | POA: Diagnosis not present

## 2020-09-05 DIAGNOSIS — I10 Essential (primary) hypertension: Secondary | ICD-10-CM | POA: Diagnosis not present

## 2020-09-05 DIAGNOSIS — Z89512 Acquired absence of left leg below knee: Secondary | ICD-10-CM | POA: Diagnosis not present

## 2020-09-05 DIAGNOSIS — L89513 Pressure ulcer of right ankle, stage 3: Secondary | ICD-10-CM | POA: Diagnosis not present

## 2020-09-05 DIAGNOSIS — L853 Xerosis cutis: Secondary | ICD-10-CM | POA: Diagnosis not present

## 2020-09-05 DIAGNOSIS — E11622 Type 2 diabetes mellitus with other skin ulcer: Secondary | ICD-10-CM | POA: Diagnosis not present

## 2020-09-05 DIAGNOSIS — I2510 Atherosclerotic heart disease of native coronary artery without angina pectoris: Secondary | ICD-10-CM | POA: Diagnosis not present

## 2020-09-05 DIAGNOSIS — E7800 Pure hypercholesterolemia, unspecified: Secondary | ICD-10-CM | POA: Diagnosis not present

## 2020-09-12 DIAGNOSIS — E11621 Type 2 diabetes mellitus with foot ulcer: Secondary | ICD-10-CM | POA: Diagnosis not present

## 2020-09-12 DIAGNOSIS — L97329 Non-pressure chronic ulcer of left ankle with unspecified severity: Secondary | ICD-10-CM | POA: Diagnosis not present

## 2020-09-12 DIAGNOSIS — I87311 Chronic venous hypertension (idiopathic) with ulcer of right lower extremity: Secondary | ICD-10-CM | POA: Diagnosis not present

## 2020-09-12 DIAGNOSIS — L97519 Non-pressure chronic ulcer of other part of right foot with unspecified severity: Secondary | ICD-10-CM | POA: Diagnosis not present

## 2020-09-12 DIAGNOSIS — L97319 Non-pressure chronic ulcer of right ankle with unspecified severity: Secondary | ICD-10-CM | POA: Diagnosis not present

## 2020-09-12 DIAGNOSIS — E11622 Type 2 diabetes mellitus with other skin ulcer: Secondary | ICD-10-CM | POA: Diagnosis not present

## 2020-09-14 DIAGNOSIS — E7849 Other hyperlipidemia: Secondary | ICD-10-CM | POA: Diagnosis not present

## 2020-09-14 DIAGNOSIS — I10 Essential (primary) hypertension: Secondary | ICD-10-CM | POA: Diagnosis not present

## 2020-09-14 DIAGNOSIS — N401 Enlarged prostate with lower urinary tract symptoms: Secondary | ICD-10-CM | POA: Diagnosis not present

## 2020-09-16 DIAGNOSIS — K219 Gastro-esophageal reflux disease without esophagitis: Secondary | ICD-10-CM | POA: Diagnosis not present

## 2020-09-16 DIAGNOSIS — G8918 Other acute postprocedural pain: Secondary | ICD-10-CM | POA: Diagnosis not present

## 2020-09-16 DIAGNOSIS — L03115 Cellulitis of right lower limb: Secondary | ICD-10-CM | POA: Diagnosis not present

## 2020-09-16 DIAGNOSIS — J984 Other disorders of lung: Secondary | ICD-10-CM | POA: Diagnosis not present

## 2020-09-16 DIAGNOSIS — E871 Hypo-osmolality and hyponatremia: Secondary | ICD-10-CM | POA: Diagnosis not present

## 2020-09-16 DIAGNOSIS — A4101 Sepsis due to Methicillin susceptible Staphylococcus aureus: Secondary | ICD-10-CM | POA: Diagnosis not present

## 2020-09-16 DIAGNOSIS — I6782 Cerebral ischemia: Secondary | ICD-10-CM | POA: Diagnosis not present

## 2020-09-16 DIAGNOSIS — I6623 Occlusion and stenosis of bilateral posterior cerebral arteries: Secondary | ICD-10-CM | POA: Diagnosis not present

## 2020-09-16 DIAGNOSIS — I2510 Atherosclerotic heart disease of native coronary artery without angina pectoris: Secondary | ICD-10-CM | POA: Diagnosis not present

## 2020-09-16 DIAGNOSIS — A4189 Other specified sepsis: Secondary | ICD-10-CM | POA: Diagnosis not present

## 2020-09-16 DIAGNOSIS — E1151 Type 2 diabetes mellitus with diabetic peripheral angiopathy without gangrene: Secondary | ICD-10-CM | POA: Diagnosis not present

## 2020-09-16 DIAGNOSIS — Y835 Amputation of limb(s) as the cause of abnormal reaction of the patient, or of later complication, without mention of misadventure at the time of the procedure: Secondary | ICD-10-CM | POA: Diagnosis not present

## 2020-09-16 DIAGNOSIS — T8753 Necrosis of amputation stump, right lower extremity: Secondary | ICD-10-CM | POA: Diagnosis not present

## 2020-09-16 DIAGNOSIS — R509 Fever, unspecified: Secondary | ICD-10-CM | POA: Diagnosis not present

## 2020-09-16 DIAGNOSIS — I10 Essential (primary) hypertension: Secondary | ICD-10-CM | POA: Diagnosis not present

## 2020-09-16 DIAGNOSIS — E11621 Type 2 diabetes mellitus with foot ulcer: Secondary | ICD-10-CM | POA: Diagnosis not present

## 2020-09-16 DIAGNOSIS — L859 Epidermal thickening, unspecified: Secondary | ICD-10-CM | POA: Diagnosis not present

## 2020-09-16 DIAGNOSIS — J90 Pleural effusion, not elsewhere classified: Secondary | ICD-10-CM | POA: Diagnosis not present

## 2020-09-16 DIAGNOSIS — Z20822 Contact with and (suspected) exposure to covid-19: Secondary | ICD-10-CM | POA: Diagnosis not present

## 2020-09-16 DIAGNOSIS — T8743 Infection of amputation stump, right lower extremity: Secondary | ICD-10-CM | POA: Diagnosis not present

## 2020-09-16 DIAGNOSIS — R110 Nausea: Secondary | ICD-10-CM | POA: Diagnosis not present

## 2020-09-16 DIAGNOSIS — L089 Local infection of the skin and subcutaneous tissue, unspecified: Secondary | ICD-10-CM | POA: Diagnosis not present

## 2020-09-16 DIAGNOSIS — I96 Gangrene, not elsewhere classified: Secondary | ICD-10-CM | POA: Diagnosis not present

## 2020-09-16 DIAGNOSIS — Z89511 Acquired absence of right leg below knee: Secondary | ICD-10-CM | POA: Diagnosis not present

## 2020-09-16 DIAGNOSIS — Z794 Long term (current) use of insulin: Secondary | ICD-10-CM | POA: Diagnosis not present

## 2020-09-16 DIAGNOSIS — L97509 Non-pressure chronic ulcer of other part of unspecified foot with unspecified severity: Secondary | ICD-10-CM | POA: Diagnosis not present

## 2020-09-16 DIAGNOSIS — R21 Rash and other nonspecific skin eruption: Secondary | ICD-10-CM | POA: Diagnosis not present

## 2020-09-16 DIAGNOSIS — Z9114 Patient's other noncompliance with medication regimen: Secondary | ICD-10-CM | POA: Diagnosis not present

## 2020-09-16 DIAGNOSIS — Z7982 Long term (current) use of aspirin: Secondary | ICD-10-CM | POA: Diagnosis not present

## 2020-09-16 DIAGNOSIS — M86171 Other acute osteomyelitis, right ankle and foot: Secondary | ICD-10-CM | POA: Diagnosis not present

## 2020-09-16 DIAGNOSIS — I77810 Thoracic aortic ectasia: Secondary | ICD-10-CM | POA: Diagnosis not present

## 2020-09-16 DIAGNOSIS — G459 Transient cerebral ischemic attack, unspecified: Secondary | ICD-10-CM | POA: Diagnosis not present

## 2020-09-16 DIAGNOSIS — I70201 Unspecified atherosclerosis of native arteries of extremities, right leg: Secondary | ICD-10-CM | POA: Diagnosis not present

## 2020-09-16 DIAGNOSIS — E119 Type 2 diabetes mellitus without complications: Secondary | ICD-10-CM | POA: Diagnosis not present

## 2020-09-16 DIAGNOSIS — I739 Peripheral vascular disease, unspecified: Secondary | ICD-10-CM | POA: Diagnosis not present

## 2020-09-16 DIAGNOSIS — I517 Cardiomegaly: Secondary | ICD-10-CM | POA: Diagnosis not present

## 2020-09-16 DIAGNOSIS — E1165 Type 2 diabetes mellitus with hyperglycemia: Secondary | ICD-10-CM | POA: Diagnosis not present

## 2020-09-16 DIAGNOSIS — T8740 Infection of amputation stump, unspecified extremity: Secondary | ICD-10-CM | POA: Diagnosis not present

## 2020-09-16 DIAGNOSIS — I70202 Unspecified atherosclerosis of native arteries of extremities, left leg: Secondary | ICD-10-CM | POA: Diagnosis not present

## 2020-09-16 DIAGNOSIS — L98499 Non-pressure chronic ulcer of skin of other sites with unspecified severity: Secondary | ICD-10-CM | POA: Diagnosis not present

## 2020-09-16 DIAGNOSIS — R0602 Shortness of breath: Secondary | ICD-10-CM | POA: Diagnosis not present

## 2020-09-16 DIAGNOSIS — J9811 Atelectasis: Secondary | ICD-10-CM | POA: Diagnosis not present

## 2020-09-16 DIAGNOSIS — I70261 Atherosclerosis of native arteries of extremities with gangrene, right leg: Secondary | ICD-10-CM | POA: Diagnosis not present

## 2020-09-16 DIAGNOSIS — A419 Sepsis, unspecified organism: Secondary | ICD-10-CM | POA: Diagnosis not present

## 2020-09-16 DIAGNOSIS — I6523 Occlusion and stenosis of bilateral carotid arteries: Secondary | ICD-10-CM | POA: Diagnosis not present

## 2020-09-16 DIAGNOSIS — N400 Enlarged prostate without lower urinary tract symptoms: Secondary | ICD-10-CM | POA: Diagnosis not present

## 2020-09-16 DIAGNOSIS — L97511 Non-pressure chronic ulcer of other part of right foot limited to breakdown of skin: Secondary | ICD-10-CM | POA: Diagnosis not present

## 2020-09-16 DIAGNOSIS — L97521 Non-pressure chronic ulcer of other part of left foot limited to breakdown of skin: Secondary | ICD-10-CM | POA: Diagnosis not present

## 2020-09-16 DIAGNOSIS — I639 Cerebral infarction, unspecified: Secondary | ICD-10-CM | POA: Diagnosis not present

## 2020-09-16 DIAGNOSIS — E785 Hyperlipidemia, unspecified: Secondary | ICD-10-CM | POA: Diagnosis not present

## 2020-09-16 DIAGNOSIS — R918 Other nonspecific abnormal finding of lung field: Secondary | ICD-10-CM | POA: Diagnosis not present

## 2020-09-16 DIAGNOSIS — R059 Cough, unspecified: Secondary | ICD-10-CM | POA: Diagnosis not present

## 2020-09-16 DIAGNOSIS — E118 Type 2 diabetes mellitus with unspecified complications: Secondary | ICD-10-CM | POA: Diagnosis not present

## 2020-09-16 DIAGNOSIS — I672 Cerebral atherosclerosis: Secondary | ICD-10-CM | POA: Diagnosis not present

## 2020-09-16 DIAGNOSIS — U071 COVID-19: Secondary | ICD-10-CM | POA: Diagnosis not present

## 2020-09-16 DIAGNOSIS — L97411 Non-pressure chronic ulcer of right heel and midfoot limited to breakdown of skin: Secondary | ICD-10-CM | POA: Diagnosis not present

## 2020-09-16 DIAGNOSIS — R7881 Bacteremia: Secondary | ICD-10-CM | POA: Diagnosis not present

## 2020-09-16 DIAGNOSIS — Z89611 Acquired absence of right leg above knee: Secondary | ICD-10-CM | POA: Diagnosis not present

## 2020-09-16 DIAGNOSIS — Z452 Encounter for adjustment and management of vascular access device: Secondary | ICD-10-CM | POA: Diagnosis not present

## 2020-09-16 DIAGNOSIS — Z79899 Other long term (current) drug therapy: Secondary | ICD-10-CM | POA: Diagnosis not present

## 2020-09-16 DIAGNOSIS — L97319 Non-pressure chronic ulcer of right ankle with unspecified severity: Secondary | ICD-10-CM | POA: Diagnosis not present

## 2020-09-16 DIAGNOSIS — B9561 Methicillin susceptible Staphylococcus aureus infection as the cause of diseases classified elsewhere: Secondary | ICD-10-CM | POA: Diagnosis not present

## 2020-09-16 DIAGNOSIS — L02415 Cutaneous abscess of right lower limb: Secondary | ICD-10-CM | POA: Diagnosis not present

## 2020-09-16 DIAGNOSIS — M898X6 Other specified disorders of bone, lower leg: Secondary | ICD-10-CM | POA: Diagnosis not present

## 2020-09-16 DIAGNOSIS — D649 Anemia, unspecified: Secondary | ICD-10-CM | POA: Diagnosis not present

## 2020-09-16 DIAGNOSIS — E11622 Type 2 diabetes mellitus with other skin ulcer: Secondary | ICD-10-CM | POA: Diagnosis not present

## 2020-09-16 DIAGNOSIS — M5136 Other intervertebral disc degeneration, lumbar region: Secondary | ICD-10-CM | POA: Diagnosis not present

## 2020-09-16 DIAGNOSIS — N1831 Chronic kidney disease, stage 3a: Secondary | ICD-10-CM | POA: Diagnosis not present

## 2020-09-16 DIAGNOSIS — R2689 Other abnormalities of gait and mobility: Secondary | ICD-10-CM | POA: Diagnosis not present

## 2020-09-16 DIAGNOSIS — E1152 Type 2 diabetes mellitus with diabetic peripheral angiopathy with gangrene: Secondary | ICD-10-CM | POA: Diagnosis not present

## 2020-09-16 DIAGNOSIS — I70221 Atherosclerosis of native arteries of extremities with rest pain, right leg: Secondary | ICD-10-CM | POA: Diagnosis not present

## 2020-09-16 DIAGNOSIS — L97519 Non-pressure chronic ulcer of other part of right foot with unspecified severity: Secondary | ICD-10-CM | POA: Diagnosis not present

## 2020-09-16 DIAGNOSIS — E876 Hypokalemia: Secondary | ICD-10-CM | POA: Diagnosis not present

## 2020-09-16 DIAGNOSIS — Z89512 Acquired absence of left leg below knee: Secondary | ICD-10-CM | POA: Diagnosis not present

## 2020-09-16 DIAGNOSIS — I5189 Other ill-defined heart diseases: Secondary | ICD-10-CM | POA: Diagnosis not present

## 2020-09-16 DIAGNOSIS — Y92239 Unspecified place in hospital as the place of occurrence of the external cause: Secondary | ICD-10-CM | POA: Diagnosis not present

## 2020-09-16 DIAGNOSIS — R109 Unspecified abdominal pain: Secondary | ICD-10-CM | POA: Diagnosis not present

## 2020-09-16 DIAGNOSIS — E43 Unspecified severe protein-calorie malnutrition: Secondary | ICD-10-CM | POA: Diagnosis not present

## 2020-09-16 DIAGNOSIS — R4182 Altered mental status, unspecified: Secondary | ICD-10-CM | POA: Diagnosis not present

## 2020-09-16 DIAGNOSIS — R079 Chest pain, unspecified: Secondary | ICD-10-CM | POA: Diagnosis not present

## 2020-09-16 DIAGNOSIS — Z7984 Long term (current) use of oral hypoglycemic drugs: Secondary | ICD-10-CM | POA: Diagnosis not present

## 2020-09-16 DIAGNOSIS — R531 Weakness: Secondary | ICD-10-CM | POA: Diagnosis not present

## 2020-09-17 DIAGNOSIS — A419 Sepsis, unspecified organism: Secondary | ICD-10-CM | POA: Diagnosis not present

## 2020-09-17 DIAGNOSIS — I10 Essential (primary) hypertension: Secondary | ICD-10-CM | POA: Diagnosis not present

## 2020-09-17 DIAGNOSIS — Z79899 Other long term (current) drug therapy: Secondary | ICD-10-CM | POA: Diagnosis not present

## 2020-09-17 DIAGNOSIS — E119 Type 2 diabetes mellitus without complications: Secondary | ICD-10-CM | POA: Diagnosis not present

## 2020-09-17 DIAGNOSIS — L97519 Non-pressure chronic ulcer of other part of right foot with unspecified severity: Secondary | ICD-10-CM | POA: Diagnosis not present

## 2020-09-17 DIAGNOSIS — I2510 Atherosclerotic heart disease of native coronary artery without angina pectoris: Secondary | ICD-10-CM | POA: Diagnosis not present

## 2020-09-18 DIAGNOSIS — I10 Essential (primary) hypertension: Secondary | ICD-10-CM | POA: Diagnosis not present

## 2020-09-18 DIAGNOSIS — R0602 Shortness of breath: Secondary | ICD-10-CM | POA: Diagnosis not present

## 2020-09-18 DIAGNOSIS — L97511 Non-pressure chronic ulcer of other part of right foot limited to breakdown of skin: Secondary | ICD-10-CM | POA: Diagnosis not present

## 2020-09-18 DIAGNOSIS — E11621 Type 2 diabetes mellitus with foot ulcer: Secondary | ICD-10-CM | POA: Diagnosis not present

## 2020-09-18 DIAGNOSIS — I2510 Atherosclerotic heart disease of native coronary artery without angina pectoris: Secondary | ICD-10-CM | POA: Diagnosis not present

## 2020-09-18 DIAGNOSIS — R531 Weakness: Secondary | ICD-10-CM | POA: Diagnosis not present

## 2020-09-18 DIAGNOSIS — A4189 Other specified sepsis: Secondary | ICD-10-CM | POA: Diagnosis not present

## 2020-09-19 DIAGNOSIS — E11622 Type 2 diabetes mellitus with other skin ulcer: Secondary | ICD-10-CM | POA: Diagnosis not present

## 2020-09-19 DIAGNOSIS — E1165 Type 2 diabetes mellitus with hyperglycemia: Secondary | ICD-10-CM | POA: Diagnosis not present

## 2020-09-19 DIAGNOSIS — I70221 Atherosclerosis of native arteries of extremities with rest pain, right leg: Secondary | ICD-10-CM | POA: Diagnosis not present

## 2020-09-19 DIAGNOSIS — L97319 Non-pressure chronic ulcer of right ankle with unspecified severity: Secondary | ICD-10-CM | POA: Diagnosis not present

## 2020-09-19 DIAGNOSIS — I96 Gangrene, not elsewhere classified: Secondary | ICD-10-CM | POA: Diagnosis not present

## 2020-09-19 DIAGNOSIS — E1152 Type 2 diabetes mellitus with diabetic peripheral angiopathy with gangrene: Secondary | ICD-10-CM | POA: Diagnosis not present

## 2020-09-19 DIAGNOSIS — I70261 Atherosclerosis of native arteries of extremities with gangrene, right leg: Secondary | ICD-10-CM | POA: Diagnosis not present

## 2020-09-19 DIAGNOSIS — A419 Sepsis, unspecified organism: Secondary | ICD-10-CM | POA: Diagnosis not present

## 2020-09-19 DIAGNOSIS — L03115 Cellulitis of right lower limb: Secondary | ICD-10-CM | POA: Diagnosis not present

## 2020-09-20 DIAGNOSIS — E11622 Type 2 diabetes mellitus with other skin ulcer: Secondary | ICD-10-CM | POA: Diagnosis not present

## 2020-09-20 DIAGNOSIS — S78111A Complete traumatic amputation at level between right hip and knee, initial encounter: Secondary | ICD-10-CM | POA: Insufficient documentation

## 2020-09-20 DIAGNOSIS — A419 Sepsis, unspecified organism: Secondary | ICD-10-CM | POA: Diagnosis not present

## 2020-09-20 DIAGNOSIS — L03115 Cellulitis of right lower limb: Secondary | ICD-10-CM | POA: Diagnosis not present

## 2020-09-20 DIAGNOSIS — L97319 Non-pressure chronic ulcer of right ankle with unspecified severity: Secondary | ICD-10-CM | POA: Diagnosis not present

## 2020-09-21 DIAGNOSIS — A419 Sepsis, unspecified organism: Secondary | ICD-10-CM | POA: Diagnosis not present

## 2020-09-21 DIAGNOSIS — L97319 Non-pressure chronic ulcer of right ankle with unspecified severity: Secondary | ICD-10-CM | POA: Diagnosis not present

## 2020-09-21 DIAGNOSIS — L03115 Cellulitis of right lower limb: Secondary | ICD-10-CM | POA: Diagnosis not present

## 2020-09-21 DIAGNOSIS — E11622 Type 2 diabetes mellitus with other skin ulcer: Secondary | ICD-10-CM | POA: Diagnosis not present

## 2020-09-22 DIAGNOSIS — E11622 Type 2 diabetes mellitus with other skin ulcer: Secondary | ICD-10-CM | POA: Diagnosis not present

## 2020-09-22 DIAGNOSIS — L97319 Non-pressure chronic ulcer of right ankle with unspecified severity: Secondary | ICD-10-CM | POA: Diagnosis not present

## 2020-09-22 DIAGNOSIS — L03115 Cellulitis of right lower limb: Secondary | ICD-10-CM | POA: Diagnosis not present

## 2020-09-22 DIAGNOSIS — I5189 Other ill-defined heart diseases: Secondary | ICD-10-CM | POA: Diagnosis not present

## 2020-09-22 DIAGNOSIS — I517 Cardiomegaly: Secondary | ICD-10-CM | POA: Diagnosis not present

## 2020-09-22 DIAGNOSIS — A419 Sepsis, unspecified organism: Secondary | ICD-10-CM | POA: Diagnosis not present

## 2020-09-22 DIAGNOSIS — I77810 Thoracic aortic ectasia: Secondary | ICD-10-CM | POA: Diagnosis not present

## 2020-09-23 DIAGNOSIS — Z89511 Acquired absence of right leg below knee: Secondary | ICD-10-CM | POA: Diagnosis not present

## 2020-09-23 DIAGNOSIS — Z7982 Long term (current) use of aspirin: Secondary | ICD-10-CM | POA: Diagnosis not present

## 2020-09-23 DIAGNOSIS — E11621 Type 2 diabetes mellitus with foot ulcer: Secondary | ICD-10-CM | POA: Diagnosis not present

## 2020-09-23 DIAGNOSIS — R059 Cough, unspecified: Secondary | ICD-10-CM | POA: Diagnosis not present

## 2020-09-23 DIAGNOSIS — R079 Chest pain, unspecified: Secondary | ICD-10-CM | POA: Diagnosis not present

## 2020-09-23 DIAGNOSIS — R7881 Bacteremia: Secondary | ICD-10-CM | POA: Diagnosis not present

## 2020-09-23 DIAGNOSIS — Z79899 Other long term (current) drug therapy: Secondary | ICD-10-CM | POA: Diagnosis not present

## 2020-09-23 DIAGNOSIS — I2510 Atherosclerotic heart disease of native coronary artery without angina pectoris: Secondary | ICD-10-CM | POA: Diagnosis not present

## 2020-09-23 DIAGNOSIS — I10 Essential (primary) hypertension: Secondary | ICD-10-CM | POA: Diagnosis not present

## 2020-09-23 DIAGNOSIS — R0602 Shortness of breath: Secondary | ICD-10-CM | POA: Diagnosis not present

## 2020-09-23 DIAGNOSIS — L97511 Non-pressure chronic ulcer of other part of right foot limited to breakdown of skin: Secondary | ICD-10-CM | POA: Diagnosis not present

## 2020-09-23 DIAGNOSIS — M5136 Other intervertebral disc degeneration, lumbar region: Secondary | ICD-10-CM | POA: Diagnosis not present

## 2020-09-23 DIAGNOSIS — B9561 Methicillin susceptible Staphylococcus aureus infection as the cause of diseases classified elsewhere: Secondary | ICD-10-CM | POA: Diagnosis not present

## 2020-09-23 DIAGNOSIS — J9811 Atelectasis: Secondary | ICD-10-CM | POA: Diagnosis not present

## 2020-09-24 DIAGNOSIS — I2510 Atherosclerotic heart disease of native coronary artery without angina pectoris: Secondary | ICD-10-CM | POA: Diagnosis not present

## 2020-09-24 DIAGNOSIS — R7881 Bacteremia: Secondary | ICD-10-CM | POA: Diagnosis not present

## 2020-09-24 DIAGNOSIS — E11621 Type 2 diabetes mellitus with foot ulcer: Secondary | ICD-10-CM | POA: Diagnosis not present

## 2020-09-24 DIAGNOSIS — Z79899 Other long term (current) drug therapy: Secondary | ICD-10-CM | POA: Diagnosis not present

## 2020-09-24 DIAGNOSIS — Z7982 Long term (current) use of aspirin: Secondary | ICD-10-CM | POA: Diagnosis not present

## 2020-09-24 DIAGNOSIS — I10 Essential (primary) hypertension: Secondary | ICD-10-CM | POA: Diagnosis not present

## 2020-09-24 DIAGNOSIS — B9561 Methicillin susceptible Staphylococcus aureus infection as the cause of diseases classified elsewhere: Secondary | ICD-10-CM | POA: Diagnosis not present

## 2020-09-24 DIAGNOSIS — L97511 Non-pressure chronic ulcer of other part of right foot limited to breakdown of skin: Secondary | ICD-10-CM | POA: Diagnosis not present

## 2020-09-24 DIAGNOSIS — Z89511 Acquired absence of right leg below knee: Secondary | ICD-10-CM | POA: Diagnosis not present

## 2020-09-25 DIAGNOSIS — A419 Sepsis, unspecified organism: Secondary | ICD-10-CM | POA: Diagnosis not present

## 2020-09-25 DIAGNOSIS — D649 Anemia, unspecified: Secondary | ICD-10-CM | POA: Insufficient documentation

## 2020-09-25 DIAGNOSIS — L03115 Cellulitis of right lower limb: Secondary | ICD-10-CM | POA: Diagnosis not present

## 2020-09-25 DIAGNOSIS — E11622 Type 2 diabetes mellitus with other skin ulcer: Secondary | ICD-10-CM | POA: Diagnosis not present

## 2020-09-25 DIAGNOSIS — I739 Peripheral vascular disease, unspecified: Secondary | ICD-10-CM | POA: Diagnosis not present

## 2020-09-26 DIAGNOSIS — L03115 Cellulitis of right lower limb: Secondary | ICD-10-CM | POA: Diagnosis not present

## 2020-09-26 DIAGNOSIS — A419 Sepsis, unspecified organism: Secondary | ICD-10-CM | POA: Diagnosis not present

## 2020-09-26 DIAGNOSIS — E11622 Type 2 diabetes mellitus with other skin ulcer: Secondary | ICD-10-CM | POA: Diagnosis not present

## 2020-09-26 DIAGNOSIS — I739 Peripheral vascular disease, unspecified: Secondary | ICD-10-CM | POA: Diagnosis not present

## 2020-09-27 DIAGNOSIS — I739 Peripheral vascular disease, unspecified: Secondary | ICD-10-CM | POA: Diagnosis not present

## 2020-09-27 DIAGNOSIS — L03115 Cellulitis of right lower limb: Secondary | ICD-10-CM | POA: Diagnosis not present

## 2020-09-27 DIAGNOSIS — A419 Sepsis, unspecified organism: Secondary | ICD-10-CM | POA: Diagnosis not present

## 2020-09-27 DIAGNOSIS — E11622 Type 2 diabetes mellitus with other skin ulcer: Secondary | ICD-10-CM | POA: Diagnosis not present

## 2020-09-28 DIAGNOSIS — I739 Peripheral vascular disease, unspecified: Secondary | ICD-10-CM | POA: Diagnosis not present

## 2020-09-28 DIAGNOSIS — R079 Chest pain, unspecified: Secondary | ICD-10-CM | POA: Diagnosis not present

## 2020-09-28 DIAGNOSIS — L03115 Cellulitis of right lower limb: Secondary | ICD-10-CM | POA: Diagnosis not present

## 2020-09-28 DIAGNOSIS — R0602 Shortness of breath: Secondary | ICD-10-CM | POA: Diagnosis not present

## 2020-09-28 DIAGNOSIS — A419 Sepsis, unspecified organism: Secondary | ICD-10-CM | POA: Diagnosis not present

## 2020-09-28 DIAGNOSIS — J9811 Atelectasis: Secondary | ICD-10-CM | POA: Diagnosis not present

## 2020-09-28 DIAGNOSIS — E11622 Type 2 diabetes mellitus with other skin ulcer: Secondary | ICD-10-CM | POA: Diagnosis not present

## 2020-09-29 DIAGNOSIS — I739 Peripheral vascular disease, unspecified: Secondary | ICD-10-CM | POA: Diagnosis not present

## 2020-09-29 DIAGNOSIS — E11622 Type 2 diabetes mellitus with other skin ulcer: Secondary | ICD-10-CM | POA: Diagnosis not present

## 2020-09-29 DIAGNOSIS — A419 Sepsis, unspecified organism: Secondary | ICD-10-CM | POA: Diagnosis not present

## 2020-09-29 DIAGNOSIS — L03115 Cellulitis of right lower limb: Secondary | ICD-10-CM | POA: Diagnosis not present

## 2020-09-29 DIAGNOSIS — T8740 Infection of amputation stump, unspecified extremity: Secondary | ICD-10-CM | POA: Diagnosis not present

## 2020-09-29 DIAGNOSIS — L02415 Cutaneous abscess of right lower limb: Secondary | ICD-10-CM | POA: Diagnosis not present

## 2020-09-29 DIAGNOSIS — T8743 Infection of amputation stump, right lower extremity: Secondary | ICD-10-CM | POA: Diagnosis not present

## 2020-09-29 DIAGNOSIS — E1165 Type 2 diabetes mellitus with hyperglycemia: Secondary | ICD-10-CM | POA: Diagnosis not present

## 2020-09-30 DIAGNOSIS — I10 Essential (primary) hypertension: Secondary | ICD-10-CM | POA: Diagnosis not present

## 2020-09-30 DIAGNOSIS — Z79899 Other long term (current) drug therapy: Secondary | ICD-10-CM | POA: Diagnosis not present

## 2020-09-30 DIAGNOSIS — Z89511 Acquired absence of right leg below knee: Secondary | ICD-10-CM | POA: Diagnosis not present

## 2020-09-30 DIAGNOSIS — I2510 Atherosclerotic heart disease of native coronary artery without angina pectoris: Secondary | ICD-10-CM | POA: Diagnosis not present

## 2020-09-30 DIAGNOSIS — E11621 Type 2 diabetes mellitus with foot ulcer: Secondary | ICD-10-CM | POA: Diagnosis not present

## 2020-09-30 DIAGNOSIS — Z7982 Long term (current) use of aspirin: Secondary | ICD-10-CM | POA: Diagnosis not present

## 2020-09-30 DIAGNOSIS — L97521 Non-pressure chronic ulcer of other part of left foot limited to breakdown of skin: Secondary | ICD-10-CM | POA: Diagnosis not present

## 2020-10-01 DIAGNOSIS — Z79899 Other long term (current) drug therapy: Secondary | ICD-10-CM | POA: Diagnosis not present

## 2020-10-01 DIAGNOSIS — L97511 Non-pressure chronic ulcer of other part of right foot limited to breakdown of skin: Secondary | ICD-10-CM | POA: Diagnosis not present

## 2020-10-01 DIAGNOSIS — E1152 Type 2 diabetes mellitus with diabetic peripheral angiopathy with gangrene: Secondary | ICD-10-CM | POA: Diagnosis not present

## 2020-10-01 DIAGNOSIS — E11621 Type 2 diabetes mellitus with foot ulcer: Secondary | ICD-10-CM | POA: Diagnosis not present

## 2020-10-01 DIAGNOSIS — I2510 Atherosclerotic heart disease of native coronary artery without angina pectoris: Secondary | ICD-10-CM | POA: Diagnosis not present

## 2020-10-01 DIAGNOSIS — I10 Essential (primary) hypertension: Secondary | ICD-10-CM | POA: Diagnosis not present

## 2020-10-01 DIAGNOSIS — Z7982 Long term (current) use of aspirin: Secondary | ICD-10-CM | POA: Diagnosis not present

## 2020-10-01 DIAGNOSIS — Z89511 Acquired absence of right leg below knee: Secondary | ICD-10-CM | POA: Diagnosis not present

## 2020-10-02 DIAGNOSIS — A419 Sepsis, unspecified organism: Secondary | ICD-10-CM | POA: Diagnosis not present

## 2020-10-02 DIAGNOSIS — I739 Peripheral vascular disease, unspecified: Secondary | ICD-10-CM | POA: Diagnosis not present

## 2020-10-02 DIAGNOSIS — E11622 Type 2 diabetes mellitus with other skin ulcer: Secondary | ICD-10-CM | POA: Diagnosis not present

## 2020-10-02 DIAGNOSIS — L03115 Cellulitis of right lower limb: Secondary | ICD-10-CM | POA: Diagnosis not present

## 2020-10-03 DIAGNOSIS — A419 Sepsis, unspecified organism: Secondary | ICD-10-CM | POA: Diagnosis not present

## 2020-10-03 DIAGNOSIS — E11622 Type 2 diabetes mellitus with other skin ulcer: Secondary | ICD-10-CM | POA: Diagnosis not present

## 2020-10-03 DIAGNOSIS — I739 Peripheral vascular disease, unspecified: Secondary | ICD-10-CM | POA: Diagnosis not present

## 2020-10-03 DIAGNOSIS — L03115 Cellulitis of right lower limb: Secondary | ICD-10-CM | POA: Diagnosis not present

## 2020-10-04 DIAGNOSIS — I739 Peripheral vascular disease, unspecified: Secondary | ICD-10-CM | POA: Diagnosis not present

## 2020-10-04 DIAGNOSIS — R918 Other nonspecific abnormal finding of lung field: Secondary | ICD-10-CM | POA: Diagnosis not present

## 2020-10-04 DIAGNOSIS — L03115 Cellulitis of right lower limb: Secondary | ICD-10-CM | POA: Diagnosis not present

## 2020-10-04 DIAGNOSIS — R509 Fever, unspecified: Secondary | ICD-10-CM | POA: Diagnosis not present

## 2020-10-04 DIAGNOSIS — A419 Sepsis, unspecified organism: Secondary | ICD-10-CM | POA: Diagnosis not present

## 2020-10-04 DIAGNOSIS — E11622 Type 2 diabetes mellitus with other skin ulcer: Secondary | ICD-10-CM | POA: Diagnosis not present

## 2020-10-05 DIAGNOSIS — L03115 Cellulitis of right lower limb: Secondary | ICD-10-CM | POA: Diagnosis not present

## 2020-10-05 DIAGNOSIS — I70202 Unspecified atherosclerosis of native arteries of extremities, left leg: Secondary | ICD-10-CM | POA: Diagnosis not present

## 2020-10-05 DIAGNOSIS — T8753 Necrosis of amputation stump, right lower extremity: Secondary | ICD-10-CM | POA: Diagnosis not present

## 2020-10-05 DIAGNOSIS — M898X6 Other specified disorders of bone, lower leg: Secondary | ICD-10-CM | POA: Diagnosis not present

## 2020-10-05 DIAGNOSIS — A419 Sepsis, unspecified organism: Secondary | ICD-10-CM | POA: Diagnosis not present

## 2020-10-05 DIAGNOSIS — I2510 Atherosclerotic heart disease of native coronary artery without angina pectoris: Secondary | ICD-10-CM | POA: Diagnosis not present

## 2020-10-05 DIAGNOSIS — I739 Peripheral vascular disease, unspecified: Secondary | ICD-10-CM | POA: Diagnosis not present

## 2020-10-05 DIAGNOSIS — G8918 Other acute postprocedural pain: Secondary | ICD-10-CM | POA: Diagnosis not present

## 2020-10-05 DIAGNOSIS — Z89511 Acquired absence of right leg below knee: Secondary | ICD-10-CM | POA: Diagnosis not present

## 2020-10-05 DIAGNOSIS — E11622 Type 2 diabetes mellitus with other skin ulcer: Secondary | ICD-10-CM | POA: Diagnosis not present

## 2020-10-05 DIAGNOSIS — E1152 Type 2 diabetes mellitus with diabetic peripheral angiopathy with gangrene: Secondary | ICD-10-CM | POA: Diagnosis not present

## 2020-10-06 DIAGNOSIS — E11622 Type 2 diabetes mellitus with other skin ulcer: Secondary | ICD-10-CM | POA: Diagnosis not present

## 2020-10-06 DIAGNOSIS — I739 Peripheral vascular disease, unspecified: Secondary | ICD-10-CM | POA: Diagnosis not present

## 2020-10-06 DIAGNOSIS — L03115 Cellulitis of right lower limb: Secondary | ICD-10-CM | POA: Diagnosis not present

## 2020-10-06 DIAGNOSIS — A419 Sepsis, unspecified organism: Secondary | ICD-10-CM | POA: Diagnosis not present

## 2020-10-07 DIAGNOSIS — I10 Essential (primary) hypertension: Secondary | ICD-10-CM | POA: Diagnosis not present

## 2020-10-07 DIAGNOSIS — I2510 Atherosclerotic heart disease of native coronary artery without angina pectoris: Secondary | ICD-10-CM | POA: Diagnosis not present

## 2020-10-07 DIAGNOSIS — T8743 Infection of amputation stump, right lower extremity: Secondary | ICD-10-CM | POA: Diagnosis not present

## 2020-10-07 DIAGNOSIS — E871 Hypo-osmolality and hyponatremia: Secondary | ICD-10-CM | POA: Diagnosis not present

## 2020-10-07 DIAGNOSIS — E119 Type 2 diabetes mellitus without complications: Secondary | ICD-10-CM | POA: Diagnosis not present

## 2020-10-07 DIAGNOSIS — D649 Anemia, unspecified: Secondary | ICD-10-CM | POA: Diagnosis not present

## 2020-10-07 DIAGNOSIS — Z7982 Long term (current) use of aspirin: Secondary | ICD-10-CM | POA: Diagnosis not present

## 2020-10-07 DIAGNOSIS — Z89511 Acquired absence of right leg below knee: Secondary | ICD-10-CM | POA: Diagnosis not present

## 2020-10-07 DIAGNOSIS — N400 Enlarged prostate without lower urinary tract symptoms: Secondary | ICD-10-CM | POA: Diagnosis not present

## 2020-10-07 DIAGNOSIS — A419 Sepsis, unspecified organism: Secondary | ICD-10-CM | POA: Diagnosis not present

## 2020-10-08 DIAGNOSIS — Z7982 Long term (current) use of aspirin: Secondary | ICD-10-CM | POA: Diagnosis not present

## 2020-10-08 DIAGNOSIS — I10 Essential (primary) hypertension: Secondary | ICD-10-CM | POA: Diagnosis not present

## 2020-10-08 DIAGNOSIS — T8743 Infection of amputation stump, right lower extremity: Secondary | ICD-10-CM | POA: Diagnosis not present

## 2020-10-08 DIAGNOSIS — D649 Anemia, unspecified: Secondary | ICD-10-CM | POA: Diagnosis not present

## 2020-10-08 DIAGNOSIS — N400 Enlarged prostate without lower urinary tract symptoms: Secondary | ICD-10-CM | POA: Diagnosis not present

## 2020-10-08 DIAGNOSIS — A4189 Other specified sepsis: Secondary | ICD-10-CM | POA: Diagnosis not present

## 2020-10-08 DIAGNOSIS — E871 Hypo-osmolality and hyponatremia: Secondary | ICD-10-CM | POA: Diagnosis not present

## 2020-10-08 DIAGNOSIS — Z89511 Acquired absence of right leg below knee: Secondary | ICD-10-CM | POA: Diagnosis not present

## 2020-10-08 DIAGNOSIS — E119 Type 2 diabetes mellitus without complications: Secondary | ICD-10-CM | POA: Diagnosis not present

## 2020-10-08 DIAGNOSIS — I2510 Atherosclerotic heart disease of native coronary artery without angina pectoris: Secondary | ICD-10-CM | POA: Diagnosis not present

## 2020-10-09 DIAGNOSIS — I739 Peripheral vascular disease, unspecified: Secondary | ICD-10-CM | POA: Diagnosis not present

## 2020-10-09 DIAGNOSIS — L03115 Cellulitis of right lower limb: Secondary | ICD-10-CM | POA: Diagnosis not present

## 2020-10-09 DIAGNOSIS — E11622 Type 2 diabetes mellitus with other skin ulcer: Secondary | ICD-10-CM | POA: Diagnosis not present

## 2020-10-09 DIAGNOSIS — A419 Sepsis, unspecified organism: Secondary | ICD-10-CM | POA: Diagnosis not present

## 2020-10-10 DIAGNOSIS — E1152 Type 2 diabetes mellitus with diabetic peripheral angiopathy with gangrene: Secondary | ICD-10-CM | POA: Diagnosis not present

## 2020-10-10 DIAGNOSIS — I2510 Atherosclerotic heart disease of native coronary artery without angina pectoris: Secondary | ICD-10-CM | POA: Diagnosis not present

## 2020-10-10 DIAGNOSIS — T8753 Necrosis of amputation stump, right lower extremity: Secondary | ICD-10-CM | POA: Diagnosis not present

## 2020-10-10 DIAGNOSIS — G8918 Other acute postprocedural pain: Secondary | ICD-10-CM | POA: Diagnosis not present

## 2020-10-10 DIAGNOSIS — I739 Peripheral vascular disease, unspecified: Secondary | ICD-10-CM | POA: Diagnosis not present

## 2020-10-10 DIAGNOSIS — E11622 Type 2 diabetes mellitus with other skin ulcer: Secondary | ICD-10-CM | POA: Diagnosis not present

## 2020-10-10 DIAGNOSIS — Z89511 Acquired absence of right leg below knee: Secondary | ICD-10-CM | POA: Diagnosis not present

## 2020-10-10 DIAGNOSIS — G459 Transient cerebral ischemic attack, unspecified: Secondary | ICD-10-CM | POA: Diagnosis not present

## 2020-10-10 DIAGNOSIS — A419 Sepsis, unspecified organism: Secondary | ICD-10-CM | POA: Diagnosis not present

## 2020-10-10 DIAGNOSIS — I6782 Cerebral ischemia: Secondary | ICD-10-CM | POA: Diagnosis not present

## 2020-10-10 DIAGNOSIS — L03115 Cellulitis of right lower limb: Secondary | ICD-10-CM | POA: Diagnosis not present

## 2020-10-10 DIAGNOSIS — I70201 Unspecified atherosclerosis of native arteries of extremities, right leg: Secondary | ICD-10-CM | POA: Diagnosis not present

## 2020-10-10 DIAGNOSIS — R4182 Altered mental status, unspecified: Secondary | ICD-10-CM | POA: Diagnosis not present

## 2020-10-11 DIAGNOSIS — G459 Transient cerebral ischemic attack, unspecified: Secondary | ICD-10-CM | POA: Insufficient documentation

## 2020-10-11 DIAGNOSIS — I739 Peripheral vascular disease, unspecified: Secondary | ICD-10-CM | POA: Diagnosis not present

## 2020-10-11 DIAGNOSIS — R079 Chest pain, unspecified: Secondary | ICD-10-CM | POA: Diagnosis not present

## 2020-10-11 DIAGNOSIS — A419 Sepsis, unspecified organism: Secondary | ICD-10-CM | POA: Diagnosis not present

## 2020-10-11 DIAGNOSIS — I672 Cerebral atherosclerosis: Secondary | ICD-10-CM | POA: Diagnosis not present

## 2020-10-11 DIAGNOSIS — J90 Pleural effusion, not elsewhere classified: Secondary | ICD-10-CM | POA: Diagnosis not present

## 2020-10-11 DIAGNOSIS — E11622 Type 2 diabetes mellitus with other skin ulcer: Secondary | ICD-10-CM | POA: Diagnosis not present

## 2020-10-11 DIAGNOSIS — I6623 Occlusion and stenosis of bilateral posterior cerebral arteries: Secondary | ICD-10-CM | POA: Diagnosis not present

## 2020-10-11 DIAGNOSIS — L03115 Cellulitis of right lower limb: Secondary | ICD-10-CM | POA: Diagnosis not present

## 2020-10-11 DIAGNOSIS — R4182 Altered mental status, unspecified: Secondary | ICD-10-CM | POA: Diagnosis not present

## 2020-10-11 DIAGNOSIS — I6523 Occlusion and stenosis of bilateral carotid arteries: Secondary | ICD-10-CM | POA: Diagnosis not present

## 2020-10-11 DIAGNOSIS — I517 Cardiomegaly: Secondary | ICD-10-CM | POA: Diagnosis not present

## 2020-10-11 DIAGNOSIS — R531 Weakness: Secondary | ICD-10-CM | POA: Diagnosis not present

## 2020-10-11 DIAGNOSIS — J9811 Atelectasis: Secondary | ICD-10-CM | POA: Diagnosis not present

## 2020-10-12 DIAGNOSIS — A419 Sepsis, unspecified organism: Secondary | ICD-10-CM | POA: Diagnosis not present

## 2020-10-12 DIAGNOSIS — I739 Peripheral vascular disease, unspecified: Secondary | ICD-10-CM | POA: Diagnosis not present

## 2020-10-12 DIAGNOSIS — L03115 Cellulitis of right lower limb: Secondary | ICD-10-CM | POA: Diagnosis not present

## 2020-10-12 DIAGNOSIS — E11622 Type 2 diabetes mellitus with other skin ulcer: Secondary | ICD-10-CM | POA: Diagnosis not present

## 2020-10-13 DIAGNOSIS — A419 Sepsis, unspecified organism: Secondary | ICD-10-CM | POA: Diagnosis not present

## 2020-10-13 DIAGNOSIS — I739 Peripheral vascular disease, unspecified: Secondary | ICD-10-CM | POA: Diagnosis not present

## 2020-10-13 DIAGNOSIS — L03115 Cellulitis of right lower limb: Secondary | ICD-10-CM | POA: Diagnosis not present

## 2020-10-13 DIAGNOSIS — E11622 Type 2 diabetes mellitus with other skin ulcer: Secondary | ICD-10-CM | POA: Diagnosis not present

## 2020-10-14 DIAGNOSIS — Z89611 Acquired absence of right leg above knee: Secondary | ICD-10-CM | POA: Diagnosis not present

## 2020-10-14 DIAGNOSIS — I2510 Atherosclerotic heart disease of native coronary artery without angina pectoris: Secondary | ICD-10-CM | POA: Diagnosis not present

## 2020-10-14 DIAGNOSIS — Z79899 Other long term (current) drug therapy: Secondary | ICD-10-CM | POA: Diagnosis not present

## 2020-10-14 DIAGNOSIS — Z9114 Patient's other noncompliance with medication regimen: Secondary | ICD-10-CM | POA: Diagnosis not present

## 2020-10-14 DIAGNOSIS — Z794 Long term (current) use of insulin: Secondary | ICD-10-CM | POA: Diagnosis not present

## 2020-10-14 DIAGNOSIS — A419 Sepsis, unspecified organism: Secondary | ICD-10-CM | POA: Diagnosis not present

## 2020-10-14 DIAGNOSIS — D649 Anemia, unspecified: Secondary | ICD-10-CM | POA: Diagnosis not present

## 2020-10-14 DIAGNOSIS — G459 Transient cerebral ischemic attack, unspecified: Secondary | ICD-10-CM | POA: Diagnosis not present

## 2020-10-14 DIAGNOSIS — I10 Essential (primary) hypertension: Secondary | ICD-10-CM | POA: Diagnosis not present

## 2020-10-14 DIAGNOSIS — E118 Type 2 diabetes mellitus with unspecified complications: Secondary | ICD-10-CM | POA: Diagnosis not present

## 2020-10-15 DIAGNOSIS — R21 Rash and other nonspecific skin eruption: Secondary | ICD-10-CM | POA: Diagnosis not present

## 2020-10-15 DIAGNOSIS — I10 Essential (primary) hypertension: Secondary | ICD-10-CM | POA: Diagnosis not present

## 2020-10-15 DIAGNOSIS — A419 Sepsis, unspecified organism: Secondary | ICD-10-CM | POA: Diagnosis not present

## 2020-10-15 DIAGNOSIS — I2510 Atherosclerotic heart disease of native coronary artery without angina pectoris: Secondary | ICD-10-CM | POA: Diagnosis not present

## 2020-10-15 DIAGNOSIS — Z9114 Patient's other noncompliance with medication regimen: Secondary | ICD-10-CM | POA: Diagnosis not present

## 2020-10-15 DIAGNOSIS — Z79899 Other long term (current) drug therapy: Secondary | ICD-10-CM | POA: Diagnosis not present

## 2020-10-15 DIAGNOSIS — Z89611 Acquired absence of right leg above knee: Secondary | ICD-10-CM | POA: Diagnosis not present

## 2020-10-15 DIAGNOSIS — E118 Type 2 diabetes mellitus with unspecified complications: Secondary | ICD-10-CM | POA: Diagnosis not present

## 2020-10-15 DIAGNOSIS — D649 Anemia, unspecified: Secondary | ICD-10-CM | POA: Diagnosis not present

## 2020-10-15 DIAGNOSIS — G459 Transient cerebral ischemic attack, unspecified: Secondary | ICD-10-CM | POA: Diagnosis not present

## 2020-10-16 DIAGNOSIS — J984 Other disorders of lung: Secondary | ICD-10-CM | POA: Diagnosis not present

## 2020-10-16 DIAGNOSIS — Z452 Encounter for adjustment and management of vascular access device: Secondary | ICD-10-CM | POA: Diagnosis not present

## 2020-10-16 DIAGNOSIS — E11622 Type 2 diabetes mellitus with other skin ulcer: Secondary | ICD-10-CM | POA: Diagnosis not present

## 2020-10-16 DIAGNOSIS — A419 Sepsis, unspecified organism: Secondary | ICD-10-CM | POA: Diagnosis not present

## 2020-10-16 DIAGNOSIS — I739 Peripheral vascular disease, unspecified: Secondary | ICD-10-CM | POA: Diagnosis not present

## 2020-10-16 DIAGNOSIS — L03115 Cellulitis of right lower limb: Secondary | ICD-10-CM | POA: Diagnosis not present

## 2020-10-17 DIAGNOSIS — A419 Sepsis, unspecified organism: Secondary | ICD-10-CM | POA: Diagnosis not present

## 2020-10-17 DIAGNOSIS — E11622 Type 2 diabetes mellitus with other skin ulcer: Secondary | ICD-10-CM | POA: Diagnosis not present

## 2020-10-17 DIAGNOSIS — L03115 Cellulitis of right lower limb: Secondary | ICD-10-CM | POA: Diagnosis not present

## 2020-10-17 DIAGNOSIS — I739 Peripheral vascular disease, unspecified: Secondary | ICD-10-CM | POA: Diagnosis not present

## 2020-10-18 DIAGNOSIS — A419 Sepsis, unspecified organism: Secondary | ICD-10-CM | POA: Diagnosis not present

## 2020-10-18 DIAGNOSIS — L03115 Cellulitis of right lower limb: Secondary | ICD-10-CM | POA: Diagnosis not present

## 2020-10-18 DIAGNOSIS — I739 Peripheral vascular disease, unspecified: Secondary | ICD-10-CM | POA: Diagnosis not present

## 2020-10-18 DIAGNOSIS — E11622 Type 2 diabetes mellitus with other skin ulcer: Secondary | ICD-10-CM | POA: Diagnosis not present

## 2020-10-19 DIAGNOSIS — E11622 Type 2 diabetes mellitus with other skin ulcer: Secondary | ICD-10-CM | POA: Diagnosis not present

## 2020-10-19 DIAGNOSIS — L03115 Cellulitis of right lower limb: Secondary | ICD-10-CM | POA: Diagnosis not present

## 2020-10-19 DIAGNOSIS — A419 Sepsis, unspecified organism: Secondary | ICD-10-CM | POA: Diagnosis not present

## 2020-10-19 DIAGNOSIS — I739 Peripheral vascular disease, unspecified: Secondary | ICD-10-CM | POA: Diagnosis not present

## 2020-10-20 DIAGNOSIS — L03115 Cellulitis of right lower limb: Secondary | ICD-10-CM | POA: Diagnosis not present

## 2020-10-20 DIAGNOSIS — E11622 Type 2 diabetes mellitus with other skin ulcer: Secondary | ICD-10-CM | POA: Diagnosis not present

## 2020-10-20 DIAGNOSIS — I739 Peripheral vascular disease, unspecified: Secondary | ICD-10-CM | POA: Diagnosis not present

## 2020-10-20 DIAGNOSIS — A419 Sepsis, unspecified organism: Secondary | ICD-10-CM | POA: Diagnosis not present

## 2020-10-21 DIAGNOSIS — L97411 Non-pressure chronic ulcer of right heel and midfoot limited to breakdown of skin: Secondary | ICD-10-CM | POA: Diagnosis not present

## 2020-10-21 DIAGNOSIS — I2510 Atherosclerotic heart disease of native coronary artery without angina pectoris: Secondary | ICD-10-CM | POA: Diagnosis not present

## 2020-10-21 DIAGNOSIS — Z79899 Other long term (current) drug therapy: Secondary | ICD-10-CM | POA: Diagnosis not present

## 2020-10-21 DIAGNOSIS — E119 Type 2 diabetes mellitus without complications: Secondary | ICD-10-CM | POA: Diagnosis not present

## 2020-10-21 DIAGNOSIS — D649 Anemia, unspecified: Secondary | ICD-10-CM | POA: Diagnosis not present

## 2020-10-21 DIAGNOSIS — Z7982 Long term (current) use of aspirin: Secondary | ICD-10-CM | POA: Diagnosis not present

## 2020-10-21 DIAGNOSIS — E11621 Type 2 diabetes mellitus with foot ulcer: Secondary | ICD-10-CM | POA: Diagnosis not present

## 2020-10-21 DIAGNOSIS — I10 Essential (primary) hypertension: Secondary | ICD-10-CM | POA: Diagnosis not present

## 2020-10-21 DIAGNOSIS — Z89611 Acquired absence of right leg above knee: Secondary | ICD-10-CM | POA: Diagnosis not present

## 2020-10-22 DIAGNOSIS — I2510 Atherosclerotic heart disease of native coronary artery without angina pectoris: Secondary | ICD-10-CM | POA: Diagnosis not present

## 2020-10-22 DIAGNOSIS — E876 Hypokalemia: Secondary | ICD-10-CM | POA: Diagnosis not present

## 2020-10-22 DIAGNOSIS — I639 Cerebral infarction, unspecified: Secondary | ICD-10-CM | POA: Diagnosis not present

## 2020-10-22 DIAGNOSIS — I10 Essential (primary) hypertension: Secondary | ICD-10-CM | POA: Diagnosis not present

## 2020-10-22 DIAGNOSIS — Z7982 Long term (current) use of aspirin: Secondary | ICD-10-CM | POA: Diagnosis not present

## 2020-10-22 DIAGNOSIS — Z89611 Acquired absence of right leg above knee: Secondary | ICD-10-CM | POA: Diagnosis not present

## 2020-10-22 DIAGNOSIS — L89109 Pressure ulcer of unspecified part of back, unspecified stage: Secondary | ICD-10-CM | POA: Insufficient documentation

## 2020-10-22 DIAGNOSIS — L97411 Non-pressure chronic ulcer of right heel and midfoot limited to breakdown of skin: Secondary | ICD-10-CM | POA: Diagnosis not present

## 2020-10-22 DIAGNOSIS — E11621 Type 2 diabetes mellitus with foot ulcer: Secondary | ICD-10-CM | POA: Diagnosis not present

## 2020-10-22 DIAGNOSIS — A419 Sepsis, unspecified organism: Secondary | ICD-10-CM | POA: Diagnosis not present

## 2020-10-22 DIAGNOSIS — Z79899 Other long term (current) drug therapy: Secondary | ICD-10-CM | POA: Diagnosis not present

## 2020-10-23 DIAGNOSIS — A419 Sepsis, unspecified organism: Secondary | ICD-10-CM | POA: Diagnosis not present

## 2020-10-23 DIAGNOSIS — I739 Peripheral vascular disease, unspecified: Secondary | ICD-10-CM | POA: Diagnosis not present

## 2020-10-23 DIAGNOSIS — E11622 Type 2 diabetes mellitus with other skin ulcer: Secondary | ICD-10-CM | POA: Diagnosis not present

## 2020-10-23 DIAGNOSIS — L03115 Cellulitis of right lower limb: Secondary | ICD-10-CM | POA: Diagnosis not present

## 2020-10-24 DIAGNOSIS — A419 Sepsis, unspecified organism: Secondary | ICD-10-CM | POA: Diagnosis not present

## 2020-10-24 DIAGNOSIS — R109 Unspecified abdominal pain: Secondary | ICD-10-CM | POA: Diagnosis not present

## 2020-10-24 DIAGNOSIS — I739 Peripheral vascular disease, unspecified: Secondary | ICD-10-CM | POA: Diagnosis not present

## 2020-10-24 DIAGNOSIS — E11622 Type 2 diabetes mellitus with other skin ulcer: Secondary | ICD-10-CM | POA: Diagnosis not present

## 2020-10-24 DIAGNOSIS — L03115 Cellulitis of right lower limb: Secondary | ICD-10-CM | POA: Diagnosis not present

## 2020-10-25 DIAGNOSIS — I739 Peripheral vascular disease, unspecified: Secondary | ICD-10-CM | POA: Diagnosis not present

## 2020-10-25 DIAGNOSIS — L03115 Cellulitis of right lower limb: Secondary | ICD-10-CM | POA: Diagnosis not present

## 2020-10-25 DIAGNOSIS — A419 Sepsis, unspecified organism: Secondary | ICD-10-CM | POA: Diagnosis not present

## 2020-10-25 DIAGNOSIS — E11622 Type 2 diabetes mellitus with other skin ulcer: Secondary | ICD-10-CM | POA: Diagnosis not present

## 2020-10-26 DIAGNOSIS — A419 Sepsis, unspecified organism: Secondary | ICD-10-CM | POA: Diagnosis not present

## 2020-10-26 DIAGNOSIS — E11622 Type 2 diabetes mellitus with other skin ulcer: Secondary | ICD-10-CM | POA: Diagnosis not present

## 2020-10-26 DIAGNOSIS — I739 Peripheral vascular disease, unspecified: Secondary | ICD-10-CM | POA: Diagnosis not present

## 2020-10-26 DIAGNOSIS — L03115 Cellulitis of right lower limb: Secondary | ICD-10-CM | POA: Diagnosis not present

## 2020-10-27 DIAGNOSIS — L03115 Cellulitis of right lower limb: Secondary | ICD-10-CM | POA: Diagnosis not present

## 2020-10-27 DIAGNOSIS — A419 Sepsis, unspecified organism: Secondary | ICD-10-CM | POA: Diagnosis not present

## 2020-10-27 DIAGNOSIS — I739 Peripheral vascular disease, unspecified: Secondary | ICD-10-CM | POA: Diagnosis not present

## 2020-10-27 DIAGNOSIS — E11622 Type 2 diabetes mellitus with other skin ulcer: Secondary | ICD-10-CM | POA: Diagnosis not present

## 2020-10-28 DIAGNOSIS — Z89611 Acquired absence of right leg above knee: Secondary | ICD-10-CM | POA: Diagnosis not present

## 2020-10-28 DIAGNOSIS — A419 Sepsis, unspecified organism: Secondary | ICD-10-CM | POA: Diagnosis not present

## 2020-10-28 DIAGNOSIS — R2689 Other abnormalities of gait and mobility: Secondary | ICD-10-CM | POA: Diagnosis not present

## 2020-10-28 DIAGNOSIS — Z89512 Acquired absence of left leg below knee: Secondary | ICD-10-CM | POA: Diagnosis not present

## 2020-10-28 DIAGNOSIS — E119 Type 2 diabetes mellitus without complications: Secondary | ICD-10-CM | POA: Diagnosis not present

## 2020-10-29 DIAGNOSIS — I10 Essential (primary) hypertension: Secondary | ICD-10-CM | POA: Diagnosis not present

## 2020-10-29 DIAGNOSIS — Z89512 Acquired absence of left leg below knee: Secondary | ICD-10-CM | POA: Diagnosis not present

## 2020-10-29 DIAGNOSIS — R2689 Other abnormalities of gait and mobility: Secondary | ICD-10-CM | POA: Diagnosis not present

## 2020-10-29 DIAGNOSIS — Z89611 Acquired absence of right leg above knee: Secondary | ICD-10-CM | POA: Diagnosis not present

## 2020-10-29 DIAGNOSIS — A419 Sepsis, unspecified organism: Secondary | ICD-10-CM | POA: Diagnosis not present

## 2020-10-30 DIAGNOSIS — L03115 Cellulitis of right lower limb: Secondary | ICD-10-CM | POA: Diagnosis not present

## 2020-10-30 DIAGNOSIS — E11622 Type 2 diabetes mellitus with other skin ulcer: Secondary | ICD-10-CM | POA: Diagnosis not present

## 2020-10-30 DIAGNOSIS — I739 Peripheral vascular disease, unspecified: Secondary | ICD-10-CM | POA: Diagnosis not present

## 2020-10-30 DIAGNOSIS — A419 Sepsis, unspecified organism: Secondary | ICD-10-CM | POA: Diagnosis not present

## 2020-10-31 DIAGNOSIS — E11622 Type 2 diabetes mellitus with other skin ulcer: Secondary | ICD-10-CM | POA: Diagnosis not present

## 2020-10-31 DIAGNOSIS — I739 Peripheral vascular disease, unspecified: Secondary | ICD-10-CM | POA: Diagnosis not present

## 2020-10-31 DIAGNOSIS — A419 Sepsis, unspecified organism: Secondary | ICD-10-CM | POA: Diagnosis not present

## 2020-10-31 DIAGNOSIS — L03115 Cellulitis of right lower limb: Secondary | ICD-10-CM | POA: Diagnosis not present

## 2020-11-01 DIAGNOSIS — I739 Peripheral vascular disease, unspecified: Secondary | ICD-10-CM | POA: Diagnosis not present

## 2020-11-01 DIAGNOSIS — L03115 Cellulitis of right lower limb: Secondary | ICD-10-CM | POA: Diagnosis not present

## 2020-11-01 DIAGNOSIS — A419 Sepsis, unspecified organism: Secondary | ICD-10-CM | POA: Diagnosis not present

## 2020-11-01 DIAGNOSIS — E11622 Type 2 diabetes mellitus with other skin ulcer: Secondary | ICD-10-CM | POA: Diagnosis not present

## 2020-11-02 DIAGNOSIS — E11622 Type 2 diabetes mellitus with other skin ulcer: Secondary | ICD-10-CM | POA: Diagnosis not present

## 2020-11-02 DIAGNOSIS — I739 Peripheral vascular disease, unspecified: Secondary | ICD-10-CM | POA: Diagnosis not present

## 2020-11-02 DIAGNOSIS — L03115 Cellulitis of right lower limb: Secondary | ICD-10-CM | POA: Diagnosis not present

## 2020-11-02 DIAGNOSIS — A419 Sepsis, unspecified organism: Secondary | ICD-10-CM | POA: Diagnosis not present

## 2020-11-03 DIAGNOSIS — R109 Unspecified abdominal pain: Secondary | ICD-10-CM | POA: Diagnosis not present

## 2020-11-03 DIAGNOSIS — E11622 Type 2 diabetes mellitus with other skin ulcer: Secondary | ICD-10-CM | POA: Diagnosis not present

## 2020-11-03 DIAGNOSIS — L03115 Cellulitis of right lower limb: Secondary | ICD-10-CM | POA: Diagnosis not present

## 2020-11-03 DIAGNOSIS — R110 Nausea: Secondary | ICD-10-CM | POA: Diagnosis not present

## 2020-11-03 DIAGNOSIS — A419 Sepsis, unspecified organism: Secondary | ICD-10-CM | POA: Diagnosis not present

## 2020-11-03 DIAGNOSIS — I739 Peripheral vascular disease, unspecified: Secondary | ICD-10-CM | POA: Diagnosis not present

## 2020-11-04 DIAGNOSIS — L97411 Non-pressure chronic ulcer of right heel and midfoot limited to breakdown of skin: Secondary | ICD-10-CM | POA: Diagnosis not present

## 2020-11-04 DIAGNOSIS — K219 Gastro-esophageal reflux disease without esophagitis: Secondary | ICD-10-CM | POA: Diagnosis not present

## 2020-11-04 DIAGNOSIS — U071 COVID-19: Secondary | ICD-10-CM | POA: Diagnosis not present

## 2020-11-04 DIAGNOSIS — Z7984 Long term (current) use of oral hypoglycemic drugs: Secondary | ICD-10-CM | POA: Diagnosis not present

## 2020-11-04 DIAGNOSIS — L089 Local infection of the skin and subcutaneous tissue, unspecified: Secondary | ICD-10-CM | POA: Diagnosis not present

## 2020-11-04 DIAGNOSIS — I10 Essential (primary) hypertension: Secondary | ICD-10-CM | POA: Diagnosis not present

## 2020-11-04 DIAGNOSIS — E11621 Type 2 diabetes mellitus with foot ulcer: Secondary | ICD-10-CM | POA: Diagnosis not present

## 2020-11-04 DIAGNOSIS — Z79899 Other long term (current) drug therapy: Secondary | ICD-10-CM | POA: Diagnosis not present

## 2020-11-04 DIAGNOSIS — A419 Sepsis, unspecified organism: Secondary | ICD-10-CM | POA: Diagnosis not present

## 2020-11-05 DIAGNOSIS — L97411 Non-pressure chronic ulcer of right heel and midfoot limited to breakdown of skin: Secondary | ICD-10-CM | POA: Diagnosis not present

## 2020-11-05 DIAGNOSIS — E11621 Type 2 diabetes mellitus with foot ulcer: Secondary | ICD-10-CM | POA: Diagnosis not present

## 2020-11-05 DIAGNOSIS — Z7984 Long term (current) use of oral hypoglycemic drugs: Secondary | ICD-10-CM | POA: Diagnosis not present

## 2020-11-05 DIAGNOSIS — K219 Gastro-esophageal reflux disease without esophagitis: Secondary | ICD-10-CM | POA: Diagnosis not present

## 2020-11-05 DIAGNOSIS — A419 Sepsis, unspecified organism: Secondary | ICD-10-CM | POA: Diagnosis not present

## 2020-11-05 DIAGNOSIS — U071 COVID-19: Secondary | ICD-10-CM | POA: Diagnosis not present

## 2020-11-05 DIAGNOSIS — I10 Essential (primary) hypertension: Secondary | ICD-10-CM | POA: Diagnosis not present

## 2020-11-05 DIAGNOSIS — L089 Local infection of the skin and subcutaneous tissue, unspecified: Secondary | ICD-10-CM | POA: Diagnosis not present

## 2020-11-05 DIAGNOSIS — Z79899 Other long term (current) drug therapy: Secondary | ICD-10-CM | POA: Diagnosis not present

## 2020-11-06 DIAGNOSIS — A419 Sepsis, unspecified organism: Secondary | ICD-10-CM | POA: Diagnosis not present

## 2020-11-06 DIAGNOSIS — I739 Peripheral vascular disease, unspecified: Secondary | ICD-10-CM | POA: Diagnosis not present

## 2020-11-06 DIAGNOSIS — L03115 Cellulitis of right lower limb: Secondary | ICD-10-CM | POA: Diagnosis not present

## 2020-11-06 DIAGNOSIS — E11622 Type 2 diabetes mellitus with other skin ulcer: Secondary | ICD-10-CM | POA: Diagnosis not present

## 2020-11-07 DIAGNOSIS — L03115 Cellulitis of right lower limb: Secondary | ICD-10-CM | POA: Diagnosis not present

## 2020-11-07 DIAGNOSIS — A419 Sepsis, unspecified organism: Secondary | ICD-10-CM | POA: Diagnosis not present

## 2020-11-07 DIAGNOSIS — E11622 Type 2 diabetes mellitus with other skin ulcer: Secondary | ICD-10-CM | POA: Diagnosis not present

## 2020-11-07 DIAGNOSIS — I739 Peripheral vascular disease, unspecified: Secondary | ICD-10-CM | POA: Diagnosis not present

## 2020-11-08 DIAGNOSIS — I739 Peripheral vascular disease, unspecified: Secondary | ICD-10-CM | POA: Diagnosis not present

## 2020-11-08 DIAGNOSIS — E11622 Type 2 diabetes mellitus with other skin ulcer: Secondary | ICD-10-CM | POA: Diagnosis not present

## 2020-11-08 DIAGNOSIS — L03115 Cellulitis of right lower limb: Secondary | ICD-10-CM | POA: Diagnosis not present

## 2020-11-08 DIAGNOSIS — A419 Sepsis, unspecified organism: Secondary | ICD-10-CM | POA: Diagnosis not present

## 2020-11-09 DIAGNOSIS — L03115 Cellulitis of right lower limb: Secondary | ICD-10-CM | POA: Diagnosis not present

## 2020-11-09 DIAGNOSIS — E11622 Type 2 diabetes mellitus with other skin ulcer: Secondary | ICD-10-CM | POA: Diagnosis not present

## 2020-11-09 DIAGNOSIS — A419 Sepsis, unspecified organism: Secondary | ICD-10-CM | POA: Diagnosis not present

## 2020-11-09 DIAGNOSIS — I739 Peripheral vascular disease, unspecified: Secondary | ICD-10-CM | POA: Diagnosis not present

## 2020-11-09 DIAGNOSIS — R109 Unspecified abdominal pain: Secondary | ICD-10-CM | POA: Diagnosis not present

## 2020-11-10 DIAGNOSIS — E11622 Type 2 diabetes mellitus with other skin ulcer: Secondary | ICD-10-CM | POA: Diagnosis not present

## 2020-11-10 DIAGNOSIS — I739 Peripheral vascular disease, unspecified: Secondary | ICD-10-CM | POA: Diagnosis not present

## 2020-11-10 DIAGNOSIS — L03115 Cellulitis of right lower limb: Secondary | ICD-10-CM | POA: Diagnosis not present

## 2020-11-10 DIAGNOSIS — A419 Sepsis, unspecified organism: Secondary | ICD-10-CM | POA: Diagnosis not present

## 2020-11-11 DIAGNOSIS — Z89511 Acquired absence of right leg below knee: Secondary | ICD-10-CM | POA: Diagnosis not present

## 2020-11-11 DIAGNOSIS — E1151 Type 2 diabetes mellitus with diabetic peripheral angiopathy without gangrene: Secondary | ICD-10-CM | POA: Diagnosis not present

## 2020-11-11 DIAGNOSIS — I10 Essential (primary) hypertension: Secondary | ICD-10-CM | POA: Diagnosis not present

## 2020-11-11 DIAGNOSIS — U071 COVID-19: Secondary | ICD-10-CM | POA: Diagnosis not present

## 2020-11-12 DIAGNOSIS — Z89511 Acquired absence of right leg below knee: Secondary | ICD-10-CM | POA: Diagnosis not present

## 2020-11-12 DIAGNOSIS — E1151 Type 2 diabetes mellitus with diabetic peripheral angiopathy without gangrene: Secondary | ICD-10-CM | POA: Diagnosis not present

## 2020-11-12 DIAGNOSIS — U071 COVID-19: Secondary | ICD-10-CM | POA: Diagnosis not present

## 2020-11-12 DIAGNOSIS — I10 Essential (primary) hypertension: Secondary | ICD-10-CM | POA: Diagnosis not present

## 2020-11-14 DIAGNOSIS — E11622 Type 2 diabetes mellitus with other skin ulcer: Secondary | ICD-10-CM | POA: Diagnosis not present

## 2020-11-14 DIAGNOSIS — I739 Peripheral vascular disease, unspecified: Secondary | ICD-10-CM | POA: Diagnosis not present

## 2020-11-14 DIAGNOSIS — R0789 Other chest pain: Secondary | ICD-10-CM | POA: Diagnosis not present

## 2020-11-14 DIAGNOSIS — A419 Sepsis, unspecified organism: Secondary | ICD-10-CM | POA: Diagnosis not present

## 2020-11-14 DIAGNOSIS — Z743 Need for continuous supervision: Secondary | ICD-10-CM | POA: Diagnosis not present

## 2020-11-14 DIAGNOSIS — R0902 Hypoxemia: Secondary | ICD-10-CM | POA: Diagnosis not present

## 2020-11-14 DIAGNOSIS — R079 Chest pain, unspecified: Secondary | ICD-10-CM | POA: Diagnosis not present

## 2020-11-14 DIAGNOSIS — L03115 Cellulitis of right lower limb: Secondary | ICD-10-CM | POA: Diagnosis not present

## 2020-11-15 DIAGNOSIS — G459 Transient cerebral ischemic attack, unspecified: Secondary | ICD-10-CM | POA: Diagnosis not present

## 2020-11-15 DIAGNOSIS — R262 Difficulty in walking, not elsewhere classified: Secondary | ICD-10-CM | POA: Diagnosis not present

## 2020-11-15 DIAGNOSIS — M6281 Muscle weakness (generalized): Secondary | ICD-10-CM | POA: Diagnosis not present

## 2020-11-16 DIAGNOSIS — L97509 Non-pressure chronic ulcer of other part of unspecified foot with unspecified severity: Secondary | ICD-10-CM | POA: Diagnosis not present

## 2020-11-16 DIAGNOSIS — I10 Essential (primary) hypertension: Secondary | ICD-10-CM | POA: Diagnosis not present

## 2020-11-16 DIAGNOSIS — M6281 Muscle weakness (generalized): Secondary | ICD-10-CM | POA: Diagnosis not present

## 2020-11-16 DIAGNOSIS — E11621 Type 2 diabetes mellitus with foot ulcer: Secondary | ICD-10-CM | POA: Diagnosis not present

## 2020-11-16 DIAGNOSIS — U071 COVID-19: Secondary | ICD-10-CM | POA: Diagnosis not present

## 2020-11-16 DIAGNOSIS — R339 Retention of urine, unspecified: Secondary | ICD-10-CM | POA: Diagnosis not present

## 2020-11-16 DIAGNOSIS — D649 Anemia, unspecified: Secondary | ICD-10-CM | POA: Diagnosis not present

## 2020-11-16 DIAGNOSIS — R262 Difficulty in walking, not elsewhere classified: Secondary | ICD-10-CM | POA: Diagnosis not present

## 2020-11-16 DIAGNOSIS — G459 Transient cerebral ischemic attack, unspecified: Secondary | ICD-10-CM | POA: Diagnosis not present

## 2020-11-16 DIAGNOSIS — A419 Sepsis, unspecified organism: Secondary | ICD-10-CM | POA: Diagnosis not present

## 2020-11-17 DIAGNOSIS — G459 Transient cerebral ischemic attack, unspecified: Secondary | ICD-10-CM | POA: Diagnosis not present

## 2020-11-17 DIAGNOSIS — M6281 Muscle weakness (generalized): Secondary | ICD-10-CM | POA: Diagnosis not present

## 2020-11-17 DIAGNOSIS — J984 Other disorders of lung: Secondary | ICD-10-CM | POA: Diagnosis not present

## 2020-11-17 DIAGNOSIS — R262 Difficulty in walking, not elsewhere classified: Secondary | ICD-10-CM | POA: Diagnosis not present

## 2020-11-17 DIAGNOSIS — U071 COVID-19: Secondary | ICD-10-CM | POA: Diagnosis not present

## 2020-11-20 DIAGNOSIS — R110 Nausea: Secondary | ICD-10-CM | POA: Diagnosis not present

## 2020-11-20 DIAGNOSIS — J189 Pneumonia, unspecified organism: Secondary | ICD-10-CM | POA: Diagnosis not present

## 2020-11-20 DIAGNOSIS — R262 Difficulty in walking, not elsewhere classified: Secondary | ICD-10-CM | POA: Diagnosis not present

## 2020-11-20 DIAGNOSIS — G459 Transient cerebral ischemic attack, unspecified: Secondary | ICD-10-CM | POA: Diagnosis not present

## 2020-11-20 DIAGNOSIS — M6281 Muscle weakness (generalized): Secondary | ICD-10-CM | POA: Diagnosis not present

## 2020-11-21 DIAGNOSIS — G459 Transient cerebral ischemic attack, unspecified: Secondary | ICD-10-CM | POA: Diagnosis not present

## 2020-11-21 DIAGNOSIS — R262 Difficulty in walking, not elsewhere classified: Secondary | ICD-10-CM | POA: Diagnosis not present

## 2020-11-21 DIAGNOSIS — M6281 Muscle weakness (generalized): Secondary | ICD-10-CM | POA: Diagnosis not present

## 2020-11-21 DIAGNOSIS — R799 Abnormal finding of blood chemistry, unspecified: Secondary | ICD-10-CM | POA: Diagnosis not present

## 2020-11-22 DIAGNOSIS — G459 Transient cerebral ischemic attack, unspecified: Secondary | ICD-10-CM | POA: Diagnosis not present

## 2020-11-22 DIAGNOSIS — R262 Difficulty in walking, not elsewhere classified: Secondary | ICD-10-CM | POA: Diagnosis not present

## 2020-11-22 DIAGNOSIS — M6281 Muscle weakness (generalized): Secondary | ICD-10-CM | POA: Diagnosis not present

## 2020-11-23 DIAGNOSIS — G459 Transient cerebral ischemic attack, unspecified: Secondary | ICD-10-CM | POA: Diagnosis not present

## 2020-11-23 DIAGNOSIS — R262 Difficulty in walking, not elsewhere classified: Secondary | ICD-10-CM | POA: Diagnosis not present

## 2020-11-23 DIAGNOSIS — M6281 Muscle weakness (generalized): Secondary | ICD-10-CM | POA: Diagnosis not present

## 2020-11-23 DIAGNOSIS — R69 Illness, unspecified: Secondary | ICD-10-CM | POA: Diagnosis not present

## 2020-11-23 DIAGNOSIS — R1110 Vomiting, unspecified: Secondary | ICD-10-CM | POA: Diagnosis not present

## 2020-11-23 DIAGNOSIS — J189 Pneumonia, unspecified organism: Secondary | ICD-10-CM | POA: Diagnosis not present

## 2020-11-24 DIAGNOSIS — G459 Transient cerebral ischemic attack, unspecified: Secondary | ICD-10-CM | POA: Diagnosis not present

## 2020-11-24 DIAGNOSIS — E7800 Pure hypercholesterolemia, unspecified: Secondary | ICD-10-CM | POA: Diagnosis not present

## 2020-11-24 DIAGNOSIS — Z794 Long term (current) use of insulin: Secondary | ICD-10-CM | POA: Diagnosis not present

## 2020-11-24 DIAGNOSIS — E119 Type 2 diabetes mellitus without complications: Secondary | ICD-10-CM | POA: Diagnosis not present

## 2020-11-24 DIAGNOSIS — I2510 Atherosclerotic heart disease of native coronary artery without angina pectoris: Secondary | ICD-10-CM | POA: Diagnosis not present

## 2020-11-24 DIAGNOSIS — Z7982 Long term (current) use of aspirin: Secondary | ICD-10-CM | POA: Diagnosis not present

## 2020-11-24 DIAGNOSIS — I10 Essential (primary) hypertension: Secondary | ICD-10-CM | POA: Diagnosis not present

## 2020-11-24 DIAGNOSIS — E1140 Type 2 diabetes mellitus with diabetic neuropathy, unspecified: Secondary | ICD-10-CM | POA: Diagnosis not present

## 2020-11-24 DIAGNOSIS — Z89512 Acquired absence of left leg below knee: Secondary | ICD-10-CM | POA: Diagnosis not present

## 2020-11-24 DIAGNOSIS — R262 Difficulty in walking, not elsewhere classified: Secondary | ICD-10-CM | POA: Diagnosis not present

## 2020-11-24 DIAGNOSIS — E11628 Type 2 diabetes mellitus with other skin complications: Secondary | ICD-10-CM | POA: Diagnosis not present

## 2020-11-24 DIAGNOSIS — M6281 Muscle weakness (generalized): Secondary | ICD-10-CM | POA: Diagnosis not present

## 2020-11-24 DIAGNOSIS — E1151 Type 2 diabetes mellitus with diabetic peripheral angiopathy without gangrene: Secondary | ICD-10-CM | POA: Diagnosis not present

## 2020-11-24 DIAGNOSIS — Z7984 Long term (current) use of oral hypoglycemic drugs: Secondary | ICD-10-CM | POA: Diagnosis not present

## 2020-11-27 DIAGNOSIS — M6281 Muscle weakness (generalized): Secondary | ICD-10-CM | POA: Diagnosis not present

## 2020-11-27 DIAGNOSIS — R262 Difficulty in walking, not elsewhere classified: Secondary | ICD-10-CM | POA: Diagnosis not present

## 2020-11-27 DIAGNOSIS — G459 Transient cerebral ischemic attack, unspecified: Secondary | ICD-10-CM | POA: Diagnosis not present

## 2020-11-28 DIAGNOSIS — J189 Pneumonia, unspecified organism: Secondary | ICD-10-CM | POA: Diagnosis not present

## 2020-11-28 DIAGNOSIS — R051 Acute cough: Secondary | ICD-10-CM | POA: Diagnosis not present

## 2020-11-28 DIAGNOSIS — G459 Transient cerebral ischemic attack, unspecified: Secondary | ICD-10-CM | POA: Diagnosis not present

## 2020-11-28 DIAGNOSIS — M6281 Muscle weakness (generalized): Secondary | ICD-10-CM | POA: Diagnosis not present

## 2020-11-28 DIAGNOSIS — R262 Difficulty in walking, not elsewhere classified: Secondary | ICD-10-CM | POA: Diagnosis not present

## 2020-11-29 DIAGNOSIS — M6281 Muscle weakness (generalized): Secondary | ICD-10-CM | POA: Diagnosis not present

## 2020-11-29 DIAGNOSIS — G459 Transient cerebral ischemic attack, unspecified: Secondary | ICD-10-CM | POA: Diagnosis not present

## 2020-11-29 DIAGNOSIS — J189 Pneumonia, unspecified organism: Secondary | ICD-10-CM | POA: Diagnosis not present

## 2020-11-29 DIAGNOSIS — R262 Difficulty in walking, not elsewhere classified: Secondary | ICD-10-CM | POA: Diagnosis not present

## 2020-11-30 DIAGNOSIS — G459 Transient cerebral ischemic attack, unspecified: Secondary | ICD-10-CM | POA: Diagnosis not present

## 2020-11-30 DIAGNOSIS — M6281 Muscle weakness (generalized): Secondary | ICD-10-CM | POA: Diagnosis not present

## 2020-11-30 DIAGNOSIS — R262 Difficulty in walking, not elsewhere classified: Secondary | ICD-10-CM | POA: Diagnosis not present

## 2020-12-01 DIAGNOSIS — G459 Transient cerebral ischemic attack, unspecified: Secondary | ICD-10-CM | POA: Diagnosis not present

## 2020-12-01 DIAGNOSIS — R262 Difficulty in walking, not elsewhere classified: Secondary | ICD-10-CM | POA: Diagnosis not present

## 2020-12-01 DIAGNOSIS — M6281 Muscle weakness (generalized): Secondary | ICD-10-CM | POA: Diagnosis not present

## 2020-12-01 DIAGNOSIS — R093 Abnormal sputum: Secondary | ICD-10-CM | POA: Diagnosis not present

## 2020-12-04 DIAGNOSIS — G459 Transient cerebral ischemic attack, unspecified: Secondary | ICD-10-CM | POA: Diagnosis not present

## 2020-12-04 DIAGNOSIS — R262 Difficulty in walking, not elsewhere classified: Secondary | ICD-10-CM | POA: Diagnosis not present

## 2020-12-04 DIAGNOSIS — M6281 Muscle weakness (generalized): Secondary | ICD-10-CM | POA: Diagnosis not present

## 2020-12-05 DIAGNOSIS — D649 Anemia, unspecified: Secondary | ICD-10-CM | POA: Diagnosis not present

## 2020-12-05 DIAGNOSIS — M6281 Muscle weakness (generalized): Secondary | ICD-10-CM | POA: Diagnosis not present

## 2020-12-05 DIAGNOSIS — G459 Transient cerebral ischemic attack, unspecified: Secondary | ICD-10-CM | POA: Diagnosis not present

## 2020-12-05 DIAGNOSIS — R262 Difficulty in walking, not elsewhere classified: Secondary | ICD-10-CM | POA: Diagnosis not present

## 2020-12-06 DIAGNOSIS — M6281 Muscle weakness (generalized): Secondary | ICD-10-CM | POA: Diagnosis not present

## 2020-12-06 DIAGNOSIS — G459 Transient cerebral ischemic attack, unspecified: Secondary | ICD-10-CM | POA: Diagnosis not present

## 2020-12-06 DIAGNOSIS — R262 Difficulty in walking, not elsewhere classified: Secondary | ICD-10-CM | POA: Diagnosis not present

## 2020-12-07 DIAGNOSIS — R262 Difficulty in walking, not elsewhere classified: Secondary | ICD-10-CM | POA: Diagnosis not present

## 2020-12-07 DIAGNOSIS — D72829 Elevated white blood cell count, unspecified: Secondary | ICD-10-CM | POA: Diagnosis not present

## 2020-12-07 DIAGNOSIS — G459 Transient cerebral ischemic attack, unspecified: Secondary | ICD-10-CM | POA: Diagnosis not present

## 2020-12-07 DIAGNOSIS — M6281 Muscle weakness (generalized): Secondary | ICD-10-CM | POA: Diagnosis not present

## 2020-12-12 DIAGNOSIS — G4700 Insomnia, unspecified: Secondary | ICD-10-CM | POA: Diagnosis not present

## 2020-12-12 DIAGNOSIS — F321 Major depressive disorder, single episode, moderate: Secondary | ICD-10-CM | POA: Diagnosis not present

## 2020-12-12 DIAGNOSIS — F0391 Unspecified dementia with behavioral disturbance: Secondary | ICD-10-CM | POA: Diagnosis not present

## 2020-12-12 DIAGNOSIS — R69 Illness, unspecified: Secondary | ICD-10-CM | POA: Diagnosis not present

## 2020-12-15 DIAGNOSIS — E11621 Type 2 diabetes mellitus with foot ulcer: Secondary | ICD-10-CM | POA: Diagnosis not present

## 2020-12-15 DIAGNOSIS — R69 Illness, unspecified: Secondary | ICD-10-CM | POA: Diagnosis not present

## 2020-12-15 DIAGNOSIS — I10 Essential (primary) hypertension: Secondary | ICD-10-CM | POA: Diagnosis not present

## 2020-12-15 DIAGNOSIS — K219 Gastro-esophageal reflux disease without esophagitis: Secondary | ICD-10-CM | POA: Diagnosis not present

## 2020-12-15 DIAGNOSIS — D649 Anemia, unspecified: Secondary | ICD-10-CM | POA: Diagnosis not present

## 2020-12-15 DIAGNOSIS — E785 Hyperlipidemia, unspecified: Secondary | ICD-10-CM | POA: Diagnosis not present

## 2020-12-27 DIAGNOSIS — E11621 Type 2 diabetes mellitus with foot ulcer: Secondary | ICD-10-CM | POA: Diagnosis not present

## 2020-12-29 DIAGNOSIS — L89132 Pressure ulcer of right lower back, stage 2: Secondary | ICD-10-CM | POA: Diagnosis not present

## 2020-12-29 DIAGNOSIS — L97319 Non-pressure chronic ulcer of right ankle with unspecified severity: Secondary | ICD-10-CM | POA: Diagnosis not present

## 2020-12-29 DIAGNOSIS — Z89512 Acquired absence of left leg below knee: Secondary | ICD-10-CM | POA: Diagnosis not present

## 2020-12-29 DIAGNOSIS — E1140 Type 2 diabetes mellitus with diabetic neuropathy, unspecified: Secondary | ICD-10-CM | POA: Diagnosis not present

## 2020-12-29 DIAGNOSIS — I96 Gangrene, not elsewhere classified: Secondary | ICD-10-CM | POA: Diagnosis not present

## 2020-12-29 DIAGNOSIS — Z89611 Acquired absence of right leg above knee: Secondary | ICD-10-CM | POA: Diagnosis not present

## 2020-12-29 DIAGNOSIS — E11622 Type 2 diabetes mellitus with other skin ulcer: Secondary | ICD-10-CM | POA: Diagnosis not present

## 2020-12-29 DIAGNOSIS — Z794 Long term (current) use of insulin: Secondary | ICD-10-CM | POA: Diagnosis not present

## 2020-12-29 DIAGNOSIS — L89102 Pressure ulcer of unspecified part of back, stage 2: Secondary | ICD-10-CM | POA: Diagnosis not present

## 2020-12-29 DIAGNOSIS — I2510 Atherosclerotic heart disease of native coronary artery without angina pectoris: Secondary | ICD-10-CM | POA: Diagnosis not present

## 2021-01-09 DIAGNOSIS — R69 Illness, unspecified: Secondary | ICD-10-CM | POA: Diagnosis not present

## 2021-01-09 DIAGNOSIS — G459 Transient cerebral ischemic attack, unspecified: Secondary | ICD-10-CM | POA: Diagnosis not present

## 2021-01-09 DIAGNOSIS — R1312 Dysphagia, oropharyngeal phase: Secondary | ICD-10-CM | POA: Diagnosis not present

## 2021-01-09 DIAGNOSIS — F321 Major depressive disorder, single episode, moderate: Secondary | ICD-10-CM | POA: Diagnosis not present

## 2021-01-09 DIAGNOSIS — G4700 Insomnia, unspecified: Secondary | ICD-10-CM | POA: Diagnosis not present

## 2021-01-09 DIAGNOSIS — F03A0 Unspecified dementia, mild, without behavioral disturbance, psychotic disturbance, mood disturbance, and anxiety: Secondary | ICD-10-CM | POA: Diagnosis not present

## 2021-01-10 DIAGNOSIS — G459 Transient cerebral ischemic attack, unspecified: Secondary | ICD-10-CM | POA: Diagnosis not present

## 2021-01-10 DIAGNOSIS — R1312 Dysphagia, oropharyngeal phase: Secondary | ICD-10-CM | POA: Diagnosis not present

## 2021-01-11 DIAGNOSIS — R1312 Dysphagia, oropharyngeal phase: Secondary | ICD-10-CM | POA: Diagnosis not present

## 2021-01-11 DIAGNOSIS — G459 Transient cerebral ischemic attack, unspecified: Secondary | ICD-10-CM | POA: Diagnosis not present

## 2021-01-12 DIAGNOSIS — E119 Type 2 diabetes mellitus without complications: Secondary | ICD-10-CM | POA: Diagnosis not present

## 2021-01-12 DIAGNOSIS — I10 Essential (primary) hypertension: Secondary | ICD-10-CM | POA: Diagnosis not present

## 2021-01-12 DIAGNOSIS — D649 Anemia, unspecified: Secondary | ICD-10-CM | POA: Diagnosis not present

## 2021-01-12 DIAGNOSIS — K219 Gastro-esophageal reflux disease without esophagitis: Secondary | ICD-10-CM | POA: Diagnosis not present

## 2021-01-12 DIAGNOSIS — E785 Hyperlipidemia, unspecified: Secondary | ICD-10-CM | POA: Diagnosis not present

## 2021-01-12 DIAGNOSIS — R69 Illness, unspecified: Secondary | ICD-10-CM | POA: Diagnosis not present

## 2021-01-13 DIAGNOSIS — G459 Transient cerebral ischemic attack, unspecified: Secondary | ICD-10-CM | POA: Diagnosis not present

## 2021-01-13 DIAGNOSIS — R1312 Dysphagia, oropharyngeal phase: Secondary | ICD-10-CM | POA: Diagnosis not present

## 2021-01-14 DIAGNOSIS — R1312 Dysphagia, oropharyngeal phase: Secondary | ICD-10-CM | POA: Diagnosis not present

## 2021-01-14 DIAGNOSIS — G459 Transient cerebral ischemic attack, unspecified: Secondary | ICD-10-CM | POA: Diagnosis not present

## 2021-01-15 DIAGNOSIS — R1312 Dysphagia, oropharyngeal phase: Secondary | ICD-10-CM | POA: Diagnosis not present

## 2021-01-15 DIAGNOSIS — G459 Transient cerebral ischemic attack, unspecified: Secondary | ICD-10-CM | POA: Diagnosis not present

## 2021-01-16 DIAGNOSIS — R279 Unspecified lack of coordination: Secondary | ICD-10-CM | POA: Diagnosis not present

## 2021-01-16 DIAGNOSIS — G459 Transient cerebral ischemic attack, unspecified: Secondary | ICD-10-CM | POA: Diagnosis not present

## 2021-01-16 DIAGNOSIS — R1312 Dysphagia, oropharyngeal phase: Secondary | ICD-10-CM | POA: Diagnosis not present

## 2021-01-16 DIAGNOSIS — R262 Difficulty in walking, not elsewhere classified: Secondary | ICD-10-CM | POA: Diagnosis not present

## 2021-01-16 DIAGNOSIS — M6281 Muscle weakness (generalized): Secondary | ICD-10-CM | POA: Diagnosis not present

## 2021-01-17 DIAGNOSIS — M6281 Muscle weakness (generalized): Secondary | ICD-10-CM | POA: Diagnosis not present

## 2021-01-17 DIAGNOSIS — G459 Transient cerebral ischemic attack, unspecified: Secondary | ICD-10-CM | POA: Diagnosis not present

## 2021-01-17 DIAGNOSIS — L89322 Pressure ulcer of left buttock, stage 2: Secondary | ICD-10-CM | POA: Diagnosis not present

## 2021-01-17 DIAGNOSIS — R279 Unspecified lack of coordination: Secondary | ICD-10-CM | POA: Diagnosis not present

## 2021-01-17 DIAGNOSIS — R1312 Dysphagia, oropharyngeal phase: Secondary | ICD-10-CM | POA: Diagnosis not present

## 2021-01-17 DIAGNOSIS — R262 Difficulty in walking, not elsewhere classified: Secondary | ICD-10-CM | POA: Diagnosis not present

## 2021-01-18 DIAGNOSIS — E119 Type 2 diabetes mellitus without complications: Secondary | ICD-10-CM | POA: Diagnosis not present

## 2021-01-20 DIAGNOSIS — R1312 Dysphagia, oropharyngeal phase: Secondary | ICD-10-CM | POA: Diagnosis not present

## 2021-01-20 DIAGNOSIS — R262 Difficulty in walking, not elsewhere classified: Secondary | ICD-10-CM | POA: Diagnosis not present

## 2021-01-20 DIAGNOSIS — M6281 Muscle weakness (generalized): Secondary | ICD-10-CM | POA: Diagnosis not present

## 2021-01-20 DIAGNOSIS — G459 Transient cerebral ischemic attack, unspecified: Secondary | ICD-10-CM | POA: Diagnosis not present

## 2021-01-20 DIAGNOSIS — R279 Unspecified lack of coordination: Secondary | ICD-10-CM | POA: Diagnosis not present

## 2021-01-22 DIAGNOSIS — R1312 Dysphagia, oropharyngeal phase: Secondary | ICD-10-CM | POA: Diagnosis not present

## 2021-01-22 DIAGNOSIS — R262 Difficulty in walking, not elsewhere classified: Secondary | ICD-10-CM | POA: Diagnosis not present

## 2021-01-22 DIAGNOSIS — M6281 Muscle weakness (generalized): Secondary | ICD-10-CM | POA: Diagnosis not present

## 2021-01-22 DIAGNOSIS — G459 Transient cerebral ischemic attack, unspecified: Secondary | ICD-10-CM | POA: Diagnosis not present

## 2021-01-22 DIAGNOSIS — R279 Unspecified lack of coordination: Secondary | ICD-10-CM | POA: Diagnosis not present

## 2021-01-23 DIAGNOSIS — R262 Difficulty in walking, not elsewhere classified: Secondary | ICD-10-CM | POA: Diagnosis not present

## 2021-01-23 DIAGNOSIS — M6281 Muscle weakness (generalized): Secondary | ICD-10-CM | POA: Diagnosis not present

## 2021-01-23 DIAGNOSIS — R279 Unspecified lack of coordination: Secondary | ICD-10-CM | POA: Diagnosis not present

## 2021-01-23 DIAGNOSIS — R1312 Dysphagia, oropharyngeal phase: Secondary | ICD-10-CM | POA: Diagnosis not present

## 2021-01-23 DIAGNOSIS — G459 Transient cerebral ischemic attack, unspecified: Secondary | ICD-10-CM | POA: Diagnosis not present

## 2021-01-24 DIAGNOSIS — T7411XA Adult physical abuse, confirmed, initial encounter: Secondary | ICD-10-CM | POA: Diagnosis not present

## 2021-01-24 DIAGNOSIS — R69 Illness, unspecified: Secondary | ICD-10-CM | POA: Diagnosis not present

## 2021-01-25 DIAGNOSIS — R1312 Dysphagia, oropharyngeal phase: Secondary | ICD-10-CM | POA: Diagnosis not present

## 2021-01-25 DIAGNOSIS — M6281 Muscle weakness (generalized): Secondary | ICD-10-CM | POA: Diagnosis not present

## 2021-01-25 DIAGNOSIS — G459 Transient cerebral ischemic attack, unspecified: Secondary | ICD-10-CM | POA: Diagnosis not present

## 2021-01-25 DIAGNOSIS — R262 Difficulty in walking, not elsewhere classified: Secondary | ICD-10-CM | POA: Diagnosis not present

## 2021-01-25 DIAGNOSIS — R279 Unspecified lack of coordination: Secondary | ICD-10-CM | POA: Diagnosis not present

## 2021-01-26 DIAGNOSIS — G459 Transient cerebral ischemic attack, unspecified: Secondary | ICD-10-CM | POA: Diagnosis not present

## 2021-01-26 DIAGNOSIS — R279 Unspecified lack of coordination: Secondary | ICD-10-CM | POA: Diagnosis not present

## 2021-01-26 DIAGNOSIS — R1312 Dysphagia, oropharyngeal phase: Secondary | ICD-10-CM | POA: Diagnosis not present

## 2021-01-26 DIAGNOSIS — Z89512 Acquired absence of left leg below knee: Secondary | ICD-10-CM | POA: Diagnosis not present

## 2021-01-26 DIAGNOSIS — M6281 Muscle weakness (generalized): Secondary | ICD-10-CM | POA: Diagnosis not present

## 2021-01-26 DIAGNOSIS — R262 Difficulty in walking, not elsewhere classified: Secondary | ICD-10-CM | POA: Diagnosis not present

## 2021-01-31 DIAGNOSIS — L89322 Pressure ulcer of left buttock, stage 2: Secondary | ICD-10-CM | POA: Diagnosis not present

## 2021-02-06 DIAGNOSIS — R1312 Dysphagia, oropharyngeal phase: Secondary | ICD-10-CM | POA: Diagnosis not present

## 2021-02-06 DIAGNOSIS — R279 Unspecified lack of coordination: Secondary | ICD-10-CM | POA: Diagnosis not present

## 2021-02-06 DIAGNOSIS — R262 Difficulty in walking, not elsewhere classified: Secondary | ICD-10-CM | POA: Diagnosis not present

## 2021-02-06 DIAGNOSIS — M6281 Muscle weakness (generalized): Secondary | ICD-10-CM | POA: Diagnosis not present

## 2021-02-06 DIAGNOSIS — G459 Transient cerebral ischemic attack, unspecified: Secondary | ICD-10-CM | POA: Diagnosis not present

## 2021-02-07 DIAGNOSIS — R279 Unspecified lack of coordination: Secondary | ICD-10-CM | POA: Diagnosis not present

## 2021-02-07 DIAGNOSIS — R262 Difficulty in walking, not elsewhere classified: Secondary | ICD-10-CM | POA: Diagnosis not present

## 2021-02-07 DIAGNOSIS — G459 Transient cerebral ischemic attack, unspecified: Secondary | ICD-10-CM | POA: Diagnosis not present

## 2021-02-07 DIAGNOSIS — R1312 Dysphagia, oropharyngeal phase: Secondary | ICD-10-CM | POA: Diagnosis not present

## 2021-02-07 DIAGNOSIS — M6281 Muscle weakness (generalized): Secondary | ICD-10-CM | POA: Diagnosis not present

## 2021-02-08 DIAGNOSIS — R1312 Dysphagia, oropharyngeal phase: Secondary | ICD-10-CM | POA: Diagnosis not present

## 2021-02-08 DIAGNOSIS — R279 Unspecified lack of coordination: Secondary | ICD-10-CM | POA: Diagnosis not present

## 2021-02-08 DIAGNOSIS — R262 Difficulty in walking, not elsewhere classified: Secondary | ICD-10-CM | POA: Diagnosis not present

## 2021-02-08 DIAGNOSIS — G459 Transient cerebral ischemic attack, unspecified: Secondary | ICD-10-CM | POA: Diagnosis not present

## 2021-02-08 DIAGNOSIS — M6281 Muscle weakness (generalized): Secondary | ICD-10-CM | POA: Diagnosis not present

## 2021-02-09 DIAGNOSIS — R262 Difficulty in walking, not elsewhere classified: Secondary | ICD-10-CM | POA: Diagnosis not present

## 2021-02-09 DIAGNOSIS — M6281 Muscle weakness (generalized): Secondary | ICD-10-CM | POA: Diagnosis not present

## 2021-02-09 DIAGNOSIS — G459 Transient cerebral ischemic attack, unspecified: Secondary | ICD-10-CM | POA: Diagnosis not present

## 2021-02-09 DIAGNOSIS — R1312 Dysphagia, oropharyngeal phase: Secondary | ICD-10-CM | POA: Diagnosis not present

## 2021-02-09 DIAGNOSIS — R279 Unspecified lack of coordination: Secondary | ICD-10-CM | POA: Diagnosis not present

## 2021-02-10 DIAGNOSIS — R262 Difficulty in walking, not elsewhere classified: Secondary | ICD-10-CM | POA: Diagnosis not present

## 2021-02-10 DIAGNOSIS — M6281 Muscle weakness (generalized): Secondary | ICD-10-CM | POA: Diagnosis not present

## 2021-02-10 DIAGNOSIS — R279 Unspecified lack of coordination: Secondary | ICD-10-CM | POA: Diagnosis not present

## 2021-02-10 DIAGNOSIS — G459 Transient cerebral ischemic attack, unspecified: Secondary | ICD-10-CM | POA: Diagnosis not present

## 2021-02-10 DIAGNOSIS — R1312 Dysphagia, oropharyngeal phase: Secondary | ICD-10-CM | POA: Diagnosis not present

## 2021-02-12 DIAGNOSIS — R1312 Dysphagia, oropharyngeal phase: Secondary | ICD-10-CM | POA: Diagnosis not present

## 2021-02-12 DIAGNOSIS — M6281 Muscle weakness (generalized): Secondary | ICD-10-CM | POA: Diagnosis not present

## 2021-02-12 DIAGNOSIS — G459 Transient cerebral ischemic attack, unspecified: Secondary | ICD-10-CM | POA: Diagnosis not present

## 2021-02-12 DIAGNOSIS — R279 Unspecified lack of coordination: Secondary | ICD-10-CM | POA: Diagnosis not present

## 2021-02-12 DIAGNOSIS — R262 Difficulty in walking, not elsewhere classified: Secondary | ICD-10-CM | POA: Diagnosis not present

## 2021-02-13 DIAGNOSIS — R262 Difficulty in walking, not elsewhere classified: Secondary | ICD-10-CM | POA: Diagnosis not present

## 2021-02-13 DIAGNOSIS — R1312 Dysphagia, oropharyngeal phase: Secondary | ICD-10-CM | POA: Diagnosis not present

## 2021-02-13 DIAGNOSIS — E119 Type 2 diabetes mellitus without complications: Secondary | ICD-10-CM | POA: Diagnosis not present

## 2021-02-13 DIAGNOSIS — R279 Unspecified lack of coordination: Secondary | ICD-10-CM | POA: Diagnosis not present

## 2021-02-13 DIAGNOSIS — R69 Illness, unspecified: Secondary | ICD-10-CM | POA: Diagnosis not present

## 2021-02-13 DIAGNOSIS — G459 Transient cerebral ischemic attack, unspecified: Secondary | ICD-10-CM | POA: Diagnosis not present

## 2021-02-13 DIAGNOSIS — F03A0 Unspecified dementia, mild, without behavioral disturbance, psychotic disturbance, mood disturbance, and anxiety: Secondary | ICD-10-CM | POA: Diagnosis not present

## 2021-02-13 DIAGNOSIS — F321 Major depressive disorder, single episode, moderate: Secondary | ICD-10-CM | POA: Diagnosis not present

## 2021-02-13 DIAGNOSIS — M6281 Muscle weakness (generalized): Secondary | ICD-10-CM | POA: Diagnosis not present

## 2021-02-13 DIAGNOSIS — G4700 Insomnia, unspecified: Secondary | ICD-10-CM | POA: Diagnosis not present

## 2021-02-14 DIAGNOSIS — M6281 Muscle weakness (generalized): Secondary | ICD-10-CM | POA: Diagnosis not present

## 2021-02-14 DIAGNOSIS — R262 Difficulty in walking, not elsewhere classified: Secondary | ICD-10-CM | POA: Diagnosis not present

## 2021-02-14 DIAGNOSIS — R279 Unspecified lack of coordination: Secondary | ICD-10-CM | POA: Diagnosis not present

## 2021-02-14 DIAGNOSIS — R1312 Dysphagia, oropharyngeal phase: Secondary | ICD-10-CM | POA: Diagnosis not present

## 2021-02-14 DIAGNOSIS — G459 Transient cerebral ischemic attack, unspecified: Secondary | ICD-10-CM | POA: Diagnosis not present

## 2021-02-15 DIAGNOSIS — R262 Difficulty in walking, not elsewhere classified: Secondary | ICD-10-CM | POA: Diagnosis not present

## 2021-02-15 DIAGNOSIS — M6281 Muscle weakness (generalized): Secondary | ICD-10-CM | POA: Diagnosis not present

## 2021-02-15 DIAGNOSIS — G459 Transient cerebral ischemic attack, unspecified: Secondary | ICD-10-CM | POA: Diagnosis not present

## 2021-02-15 DIAGNOSIS — R279 Unspecified lack of coordination: Secondary | ICD-10-CM | POA: Diagnosis not present

## 2021-02-16 DIAGNOSIS — R262 Difficulty in walking, not elsewhere classified: Secondary | ICD-10-CM | POA: Diagnosis not present

## 2021-02-16 DIAGNOSIS — M6281 Muscle weakness (generalized): Secondary | ICD-10-CM | POA: Diagnosis not present

## 2021-02-16 DIAGNOSIS — R279 Unspecified lack of coordination: Secondary | ICD-10-CM | POA: Diagnosis not present

## 2021-02-16 DIAGNOSIS — G459 Transient cerebral ischemic attack, unspecified: Secondary | ICD-10-CM | POA: Diagnosis not present

## 2021-02-16 DIAGNOSIS — E119 Type 2 diabetes mellitus without complications: Secondary | ICD-10-CM | POA: Diagnosis not present

## 2021-02-19 DIAGNOSIS — M6281 Muscle weakness (generalized): Secondary | ICD-10-CM | POA: Diagnosis not present

## 2021-02-19 DIAGNOSIS — R279 Unspecified lack of coordination: Secondary | ICD-10-CM | POA: Diagnosis not present

## 2021-02-19 DIAGNOSIS — G459 Transient cerebral ischemic attack, unspecified: Secondary | ICD-10-CM | POA: Diagnosis not present

## 2021-02-19 DIAGNOSIS — R262 Difficulty in walking, not elsewhere classified: Secondary | ICD-10-CM | POA: Diagnosis not present

## 2021-02-20 DIAGNOSIS — G459 Transient cerebral ischemic attack, unspecified: Secondary | ICD-10-CM | POA: Diagnosis not present

## 2021-02-20 DIAGNOSIS — G4700 Insomnia, unspecified: Secondary | ICD-10-CM | POA: Diagnosis not present

## 2021-02-20 DIAGNOSIS — F29 Unspecified psychosis not due to a substance or known physiological condition: Secondary | ICD-10-CM | POA: Diagnosis not present

## 2021-02-20 DIAGNOSIS — M6281 Muscle weakness (generalized): Secondary | ICD-10-CM | POA: Diagnosis not present

## 2021-02-20 DIAGNOSIS — R279 Unspecified lack of coordination: Secondary | ICD-10-CM | POA: Diagnosis not present

## 2021-02-20 DIAGNOSIS — R69 Illness, unspecified: Secondary | ICD-10-CM | POA: Diagnosis not present

## 2021-02-20 DIAGNOSIS — R262 Difficulty in walking, not elsewhere classified: Secondary | ICD-10-CM | POA: Diagnosis not present

## 2021-02-20 DIAGNOSIS — E119 Type 2 diabetes mellitus without complications: Secondary | ICD-10-CM | POA: Diagnosis not present

## 2021-02-20 DIAGNOSIS — F03A0 Unspecified dementia, mild, without behavioral disturbance, psychotic disturbance, mood disturbance, and anxiety: Secondary | ICD-10-CM | POA: Diagnosis not present

## 2021-02-21 DIAGNOSIS — R262 Difficulty in walking, not elsewhere classified: Secondary | ICD-10-CM | POA: Diagnosis not present

## 2021-02-21 DIAGNOSIS — G459 Transient cerebral ischemic attack, unspecified: Secondary | ICD-10-CM | POA: Diagnosis not present

## 2021-02-21 DIAGNOSIS — R279 Unspecified lack of coordination: Secondary | ICD-10-CM | POA: Diagnosis not present

## 2021-02-21 DIAGNOSIS — M6281 Muscle weakness (generalized): Secondary | ICD-10-CM | POA: Diagnosis not present

## 2021-02-22 DIAGNOSIS — R279 Unspecified lack of coordination: Secondary | ICD-10-CM | POA: Diagnosis not present

## 2021-02-22 DIAGNOSIS — G459 Transient cerebral ischemic attack, unspecified: Secondary | ICD-10-CM | POA: Diagnosis not present

## 2021-02-22 DIAGNOSIS — M6281 Muscle weakness (generalized): Secondary | ICD-10-CM | POA: Diagnosis not present

## 2021-02-22 DIAGNOSIS — R262 Difficulty in walking, not elsewhere classified: Secondary | ICD-10-CM | POA: Diagnosis not present

## 2021-02-23 DIAGNOSIS — R262 Difficulty in walking, not elsewhere classified: Secondary | ICD-10-CM | POA: Diagnosis not present

## 2021-02-23 DIAGNOSIS — G459 Transient cerebral ischemic attack, unspecified: Secondary | ICD-10-CM | POA: Diagnosis not present

## 2021-02-23 DIAGNOSIS — E119 Type 2 diabetes mellitus without complications: Secondary | ICD-10-CM | POA: Diagnosis not present

## 2021-02-23 DIAGNOSIS — M6281 Muscle weakness (generalized): Secondary | ICD-10-CM | POA: Diagnosis not present

## 2021-02-23 DIAGNOSIS — R279 Unspecified lack of coordination: Secondary | ICD-10-CM | POA: Diagnosis not present

## 2021-02-26 DIAGNOSIS — G459 Transient cerebral ischemic attack, unspecified: Secondary | ICD-10-CM | POA: Diagnosis not present

## 2021-02-26 DIAGNOSIS — M6281 Muscle weakness (generalized): Secondary | ICD-10-CM | POA: Diagnosis not present

## 2021-02-26 DIAGNOSIS — R262 Difficulty in walking, not elsewhere classified: Secondary | ICD-10-CM | POA: Diagnosis not present

## 2021-02-26 DIAGNOSIS — R279 Unspecified lack of coordination: Secondary | ICD-10-CM | POA: Diagnosis not present

## 2021-02-27 DIAGNOSIS — R262 Difficulty in walking, not elsewhere classified: Secondary | ICD-10-CM | POA: Diagnosis not present

## 2021-02-27 DIAGNOSIS — R279 Unspecified lack of coordination: Secondary | ICD-10-CM | POA: Diagnosis not present

## 2021-02-27 DIAGNOSIS — G459 Transient cerebral ischemic attack, unspecified: Secondary | ICD-10-CM | POA: Diagnosis not present

## 2021-02-27 DIAGNOSIS — M6281 Muscle weakness (generalized): Secondary | ICD-10-CM | POA: Diagnosis not present

## 2021-02-28 DIAGNOSIS — M6281 Muscle weakness (generalized): Secondary | ICD-10-CM | POA: Diagnosis not present

## 2021-02-28 DIAGNOSIS — R262 Difficulty in walking, not elsewhere classified: Secondary | ICD-10-CM | POA: Diagnosis not present

## 2021-02-28 DIAGNOSIS — G459 Transient cerebral ischemic attack, unspecified: Secondary | ICD-10-CM | POA: Diagnosis not present

## 2021-02-28 DIAGNOSIS — R279 Unspecified lack of coordination: Secondary | ICD-10-CM | POA: Diagnosis not present

## 2021-03-01 DIAGNOSIS — R279 Unspecified lack of coordination: Secondary | ICD-10-CM | POA: Diagnosis not present

## 2021-03-01 DIAGNOSIS — R262 Difficulty in walking, not elsewhere classified: Secondary | ICD-10-CM | POA: Diagnosis not present

## 2021-03-01 DIAGNOSIS — M6281 Muscle weakness (generalized): Secondary | ICD-10-CM | POA: Diagnosis not present

## 2021-03-01 DIAGNOSIS — G459 Transient cerebral ischemic attack, unspecified: Secondary | ICD-10-CM | POA: Diagnosis not present

## 2021-03-02 DIAGNOSIS — R262 Difficulty in walking, not elsewhere classified: Secondary | ICD-10-CM | POA: Diagnosis not present

## 2021-03-02 DIAGNOSIS — M6281 Muscle weakness (generalized): Secondary | ICD-10-CM | POA: Diagnosis not present

## 2021-03-02 DIAGNOSIS — G459 Transient cerebral ischemic attack, unspecified: Secondary | ICD-10-CM | POA: Diagnosis not present

## 2021-03-02 DIAGNOSIS — R279 Unspecified lack of coordination: Secondary | ICD-10-CM | POA: Diagnosis not present

## 2021-03-04 DIAGNOSIS — R262 Difficulty in walking, not elsewhere classified: Secondary | ICD-10-CM | POA: Diagnosis not present

## 2021-03-04 DIAGNOSIS — G459 Transient cerebral ischemic attack, unspecified: Secondary | ICD-10-CM | POA: Diagnosis not present

## 2021-03-04 DIAGNOSIS — R279 Unspecified lack of coordination: Secondary | ICD-10-CM | POA: Diagnosis not present

## 2021-03-04 DIAGNOSIS — M6281 Muscle weakness (generalized): Secondary | ICD-10-CM | POA: Diagnosis not present

## 2021-03-05 DIAGNOSIS — G459 Transient cerebral ischemic attack, unspecified: Secondary | ICD-10-CM | POA: Diagnosis not present

## 2021-03-05 DIAGNOSIS — R279 Unspecified lack of coordination: Secondary | ICD-10-CM | POA: Diagnosis not present

## 2021-03-05 DIAGNOSIS — R262 Difficulty in walking, not elsewhere classified: Secondary | ICD-10-CM | POA: Diagnosis not present

## 2021-03-05 DIAGNOSIS — M6281 Muscle weakness (generalized): Secondary | ICD-10-CM | POA: Diagnosis not present

## 2021-03-06 DIAGNOSIS — G459 Transient cerebral ischemic attack, unspecified: Secondary | ICD-10-CM | POA: Diagnosis not present

## 2021-03-06 DIAGNOSIS — R262 Difficulty in walking, not elsewhere classified: Secondary | ICD-10-CM | POA: Diagnosis not present

## 2021-03-06 DIAGNOSIS — M6281 Muscle weakness (generalized): Secondary | ICD-10-CM | POA: Diagnosis not present

## 2021-03-06 DIAGNOSIS — R279 Unspecified lack of coordination: Secondary | ICD-10-CM | POA: Diagnosis not present

## 2021-03-07 DIAGNOSIS — G459 Transient cerebral ischemic attack, unspecified: Secondary | ICD-10-CM | POA: Diagnosis not present

## 2021-03-07 DIAGNOSIS — R279 Unspecified lack of coordination: Secondary | ICD-10-CM | POA: Diagnosis not present

## 2021-03-07 DIAGNOSIS — R262 Difficulty in walking, not elsewhere classified: Secondary | ICD-10-CM | POA: Diagnosis not present

## 2021-03-07 DIAGNOSIS — M6281 Muscle weakness (generalized): Secondary | ICD-10-CM | POA: Diagnosis not present

## 2021-03-08 DIAGNOSIS — G459 Transient cerebral ischemic attack, unspecified: Secondary | ICD-10-CM | POA: Diagnosis not present

## 2021-03-08 DIAGNOSIS — R262 Difficulty in walking, not elsewhere classified: Secondary | ICD-10-CM | POA: Diagnosis not present

## 2021-03-08 DIAGNOSIS — M6281 Muscle weakness (generalized): Secondary | ICD-10-CM | POA: Diagnosis not present

## 2021-03-08 DIAGNOSIS — R279 Unspecified lack of coordination: Secondary | ICD-10-CM | POA: Diagnosis not present

## 2021-03-09 DIAGNOSIS — R279 Unspecified lack of coordination: Secondary | ICD-10-CM | POA: Diagnosis not present

## 2021-03-09 DIAGNOSIS — R262 Difficulty in walking, not elsewhere classified: Secondary | ICD-10-CM | POA: Diagnosis not present

## 2021-03-09 DIAGNOSIS — M6281 Muscle weakness (generalized): Secondary | ICD-10-CM | POA: Diagnosis not present

## 2021-03-09 DIAGNOSIS — G459 Transient cerebral ischemic attack, unspecified: Secondary | ICD-10-CM | POA: Diagnosis not present

## 2021-03-12 DIAGNOSIS — R279 Unspecified lack of coordination: Secondary | ICD-10-CM | POA: Diagnosis not present

## 2021-03-12 DIAGNOSIS — R262 Difficulty in walking, not elsewhere classified: Secondary | ICD-10-CM | POA: Diagnosis not present

## 2021-03-12 DIAGNOSIS — G459 Transient cerebral ischemic attack, unspecified: Secondary | ICD-10-CM | POA: Diagnosis not present

## 2021-03-12 DIAGNOSIS — M6281 Muscle weakness (generalized): Secondary | ICD-10-CM | POA: Diagnosis not present

## 2021-03-13 DIAGNOSIS — G4700 Insomnia, unspecified: Secondary | ICD-10-CM | POA: Diagnosis not present

## 2021-03-13 DIAGNOSIS — M6281 Muscle weakness (generalized): Secondary | ICD-10-CM | POA: Diagnosis not present

## 2021-03-13 DIAGNOSIS — G459 Transient cerebral ischemic attack, unspecified: Secondary | ICD-10-CM | POA: Diagnosis not present

## 2021-03-13 DIAGNOSIS — F321 Major depressive disorder, single episode, moderate: Secondary | ICD-10-CM | POA: Diagnosis not present

## 2021-03-13 DIAGNOSIS — R279 Unspecified lack of coordination: Secondary | ICD-10-CM | POA: Diagnosis not present

## 2021-03-13 DIAGNOSIS — F03A0 Unspecified dementia, mild, without behavioral disturbance, psychotic disturbance, mood disturbance, and anxiety: Secondary | ICD-10-CM | POA: Diagnosis not present

## 2021-03-13 DIAGNOSIS — R262 Difficulty in walking, not elsewhere classified: Secondary | ICD-10-CM | POA: Diagnosis not present

## 2021-03-13 DIAGNOSIS — R69 Illness, unspecified: Secondary | ICD-10-CM | POA: Diagnosis not present

## 2021-03-14 DIAGNOSIS — M6281 Muscle weakness (generalized): Secondary | ICD-10-CM | POA: Diagnosis not present

## 2021-03-14 DIAGNOSIS — R262 Difficulty in walking, not elsewhere classified: Secondary | ICD-10-CM | POA: Diagnosis not present

## 2021-03-14 DIAGNOSIS — R279 Unspecified lack of coordination: Secondary | ICD-10-CM | POA: Diagnosis not present

## 2021-03-14 DIAGNOSIS — G459 Transient cerebral ischemic attack, unspecified: Secondary | ICD-10-CM | POA: Diagnosis not present

## 2021-03-15 DIAGNOSIS — R279 Unspecified lack of coordination: Secondary | ICD-10-CM | POA: Diagnosis not present

## 2021-03-15 DIAGNOSIS — M6281 Muscle weakness (generalized): Secondary | ICD-10-CM | POA: Diagnosis not present

## 2021-03-15 DIAGNOSIS — G459 Transient cerebral ischemic attack, unspecified: Secondary | ICD-10-CM | POA: Diagnosis not present

## 2021-03-15 DIAGNOSIS — R262 Difficulty in walking, not elsewhere classified: Secondary | ICD-10-CM | POA: Diagnosis not present

## 2021-03-16 DIAGNOSIS — R279 Unspecified lack of coordination: Secondary | ICD-10-CM | POA: Diagnosis not present

## 2021-03-16 DIAGNOSIS — G459 Transient cerebral ischemic attack, unspecified: Secondary | ICD-10-CM | POA: Diagnosis not present

## 2021-03-16 DIAGNOSIS — M6281 Muscle weakness (generalized): Secondary | ICD-10-CM | POA: Diagnosis not present

## 2021-03-16 DIAGNOSIS — R262 Difficulty in walking, not elsewhere classified: Secondary | ICD-10-CM | POA: Diagnosis not present

## 2021-03-17 DIAGNOSIS — M6281 Muscle weakness (generalized): Secondary | ICD-10-CM | POA: Diagnosis not present

## 2021-03-17 DIAGNOSIS — R262 Difficulty in walking, not elsewhere classified: Secondary | ICD-10-CM | POA: Diagnosis not present

## 2021-03-17 DIAGNOSIS — R279 Unspecified lack of coordination: Secondary | ICD-10-CM | POA: Diagnosis not present

## 2021-03-17 DIAGNOSIS — G459 Transient cerebral ischemic attack, unspecified: Secondary | ICD-10-CM | POA: Diagnosis not present

## 2021-03-19 DIAGNOSIS — R279 Unspecified lack of coordination: Secondary | ICD-10-CM | POA: Diagnosis not present

## 2021-03-19 DIAGNOSIS — G459 Transient cerebral ischemic attack, unspecified: Secondary | ICD-10-CM | POA: Diagnosis not present

## 2021-03-19 DIAGNOSIS — R262 Difficulty in walking, not elsewhere classified: Secondary | ICD-10-CM | POA: Diagnosis not present

## 2021-03-19 DIAGNOSIS — M6281 Muscle weakness (generalized): Secondary | ICD-10-CM | POA: Diagnosis not present

## 2021-03-20 DIAGNOSIS — M6281 Muscle weakness (generalized): Secondary | ICD-10-CM | POA: Diagnosis not present

## 2021-03-20 DIAGNOSIS — R279 Unspecified lack of coordination: Secondary | ICD-10-CM | POA: Diagnosis not present

## 2021-03-20 DIAGNOSIS — G459 Transient cerebral ischemic attack, unspecified: Secondary | ICD-10-CM | POA: Diagnosis not present

## 2021-03-20 DIAGNOSIS — R262 Difficulty in walking, not elsewhere classified: Secondary | ICD-10-CM | POA: Diagnosis not present

## 2021-03-21 DIAGNOSIS — M6281 Muscle weakness (generalized): Secondary | ICD-10-CM | POA: Diagnosis not present

## 2021-03-21 DIAGNOSIS — R262 Difficulty in walking, not elsewhere classified: Secondary | ICD-10-CM | POA: Diagnosis not present

## 2021-03-21 DIAGNOSIS — R279 Unspecified lack of coordination: Secondary | ICD-10-CM | POA: Diagnosis not present

## 2021-03-21 DIAGNOSIS — G459 Transient cerebral ischemic attack, unspecified: Secondary | ICD-10-CM | POA: Diagnosis not present

## 2021-03-22 DIAGNOSIS — R262 Difficulty in walking, not elsewhere classified: Secondary | ICD-10-CM | POA: Diagnosis not present

## 2021-03-22 DIAGNOSIS — M6281 Muscle weakness (generalized): Secondary | ICD-10-CM | POA: Diagnosis not present

## 2021-03-22 DIAGNOSIS — G459 Transient cerebral ischemic attack, unspecified: Secondary | ICD-10-CM | POA: Diagnosis not present

## 2021-03-22 DIAGNOSIS — R279 Unspecified lack of coordination: Secondary | ICD-10-CM | POA: Diagnosis not present

## 2021-03-23 DIAGNOSIS — R279 Unspecified lack of coordination: Secondary | ICD-10-CM | POA: Diagnosis not present

## 2021-03-23 DIAGNOSIS — G459 Transient cerebral ischemic attack, unspecified: Secondary | ICD-10-CM | POA: Diagnosis not present

## 2021-03-23 DIAGNOSIS — M6281 Muscle weakness (generalized): Secondary | ICD-10-CM | POA: Diagnosis not present

## 2021-03-23 DIAGNOSIS — R262 Difficulty in walking, not elsewhere classified: Secondary | ICD-10-CM | POA: Diagnosis not present

## 2021-03-24 DIAGNOSIS — R262 Difficulty in walking, not elsewhere classified: Secondary | ICD-10-CM | POA: Diagnosis not present

## 2021-03-24 DIAGNOSIS — G459 Transient cerebral ischemic attack, unspecified: Secondary | ICD-10-CM | POA: Diagnosis not present

## 2021-03-24 DIAGNOSIS — M6281 Muscle weakness (generalized): Secondary | ICD-10-CM | POA: Diagnosis not present

## 2021-03-24 DIAGNOSIS — R279 Unspecified lack of coordination: Secondary | ICD-10-CM | POA: Diagnosis not present

## 2021-03-26 DIAGNOSIS — G459 Transient cerebral ischemic attack, unspecified: Secondary | ICD-10-CM | POA: Diagnosis not present

## 2021-03-26 DIAGNOSIS — R262 Difficulty in walking, not elsewhere classified: Secondary | ICD-10-CM | POA: Diagnosis not present

## 2021-03-26 DIAGNOSIS — R279 Unspecified lack of coordination: Secondary | ICD-10-CM | POA: Diagnosis not present

## 2021-03-26 DIAGNOSIS — M6281 Muscle weakness (generalized): Secondary | ICD-10-CM | POA: Diagnosis not present

## 2021-03-27 DIAGNOSIS — R279 Unspecified lack of coordination: Secondary | ICD-10-CM | POA: Diagnosis not present

## 2021-03-27 DIAGNOSIS — M6281 Muscle weakness (generalized): Secondary | ICD-10-CM | POA: Diagnosis not present

## 2021-03-27 DIAGNOSIS — R262 Difficulty in walking, not elsewhere classified: Secondary | ICD-10-CM | POA: Diagnosis not present

## 2021-03-27 DIAGNOSIS — G459 Transient cerebral ischemic attack, unspecified: Secondary | ICD-10-CM | POA: Diagnosis not present

## 2021-03-28 DIAGNOSIS — M6281 Muscle weakness (generalized): Secondary | ICD-10-CM | POA: Diagnosis not present

## 2021-03-28 DIAGNOSIS — R262 Difficulty in walking, not elsewhere classified: Secondary | ICD-10-CM | POA: Diagnosis not present

## 2021-03-28 DIAGNOSIS — R279 Unspecified lack of coordination: Secondary | ICD-10-CM | POA: Diagnosis not present

## 2021-03-28 DIAGNOSIS — G459 Transient cerebral ischemic attack, unspecified: Secondary | ICD-10-CM | POA: Diagnosis not present

## 2021-03-29 DIAGNOSIS — R279 Unspecified lack of coordination: Secondary | ICD-10-CM | POA: Diagnosis not present

## 2021-03-29 DIAGNOSIS — R262 Difficulty in walking, not elsewhere classified: Secondary | ICD-10-CM | POA: Diagnosis not present

## 2021-03-29 DIAGNOSIS — M6281 Muscle weakness (generalized): Secondary | ICD-10-CM | POA: Diagnosis not present

## 2021-03-29 DIAGNOSIS — G459 Transient cerebral ischemic attack, unspecified: Secondary | ICD-10-CM | POA: Diagnosis not present

## 2021-03-30 DIAGNOSIS — G459 Transient cerebral ischemic attack, unspecified: Secondary | ICD-10-CM | POA: Diagnosis not present

## 2021-03-30 DIAGNOSIS — R279 Unspecified lack of coordination: Secondary | ICD-10-CM | POA: Diagnosis not present

## 2021-03-30 DIAGNOSIS — R262 Difficulty in walking, not elsewhere classified: Secondary | ICD-10-CM | POA: Diagnosis not present

## 2021-03-30 DIAGNOSIS — M6281 Muscle weakness (generalized): Secondary | ICD-10-CM | POA: Diagnosis not present

## 2021-03-31 DIAGNOSIS — R262 Difficulty in walking, not elsewhere classified: Secondary | ICD-10-CM | POA: Diagnosis not present

## 2021-03-31 DIAGNOSIS — G459 Transient cerebral ischemic attack, unspecified: Secondary | ICD-10-CM | POA: Diagnosis not present

## 2021-03-31 DIAGNOSIS — R279 Unspecified lack of coordination: Secondary | ICD-10-CM | POA: Diagnosis not present

## 2021-03-31 DIAGNOSIS — M6281 Muscle weakness (generalized): Secondary | ICD-10-CM | POA: Diagnosis not present

## 2021-04-02 DIAGNOSIS — M6281 Muscle weakness (generalized): Secondary | ICD-10-CM | POA: Diagnosis not present

## 2021-04-02 DIAGNOSIS — R262 Difficulty in walking, not elsewhere classified: Secondary | ICD-10-CM | POA: Diagnosis not present

## 2021-04-02 DIAGNOSIS — R279 Unspecified lack of coordination: Secondary | ICD-10-CM | POA: Diagnosis not present

## 2021-04-02 DIAGNOSIS — G459 Transient cerebral ischemic attack, unspecified: Secondary | ICD-10-CM | POA: Diagnosis not present

## 2021-04-03 DIAGNOSIS — R279 Unspecified lack of coordination: Secondary | ICD-10-CM | POA: Diagnosis not present

## 2021-04-03 DIAGNOSIS — R69 Illness, unspecified: Secondary | ICD-10-CM | POA: Diagnosis not present

## 2021-04-03 DIAGNOSIS — G459 Transient cerebral ischemic attack, unspecified: Secondary | ICD-10-CM | POA: Diagnosis not present

## 2021-04-03 DIAGNOSIS — M6281 Muscle weakness (generalized): Secondary | ICD-10-CM | POA: Diagnosis not present

## 2021-04-03 DIAGNOSIS — F321 Major depressive disorder, single episode, moderate: Secondary | ICD-10-CM | POA: Diagnosis not present

## 2021-04-03 DIAGNOSIS — F29 Unspecified psychosis not due to a substance or known physiological condition: Secondary | ICD-10-CM | POA: Diagnosis not present

## 2021-04-03 DIAGNOSIS — F03A0 Unspecified dementia, mild, without behavioral disturbance, psychotic disturbance, mood disturbance, and anxiety: Secondary | ICD-10-CM | POA: Diagnosis not present

## 2021-04-03 DIAGNOSIS — G4700 Insomnia, unspecified: Secondary | ICD-10-CM | POA: Diagnosis not present

## 2021-04-03 DIAGNOSIS — R262 Difficulty in walking, not elsewhere classified: Secondary | ICD-10-CM | POA: Diagnosis not present

## 2021-04-04 DIAGNOSIS — M6281 Muscle weakness (generalized): Secondary | ICD-10-CM | POA: Diagnosis not present

## 2021-04-04 DIAGNOSIS — R262 Difficulty in walking, not elsewhere classified: Secondary | ICD-10-CM | POA: Diagnosis not present

## 2021-04-04 DIAGNOSIS — R279 Unspecified lack of coordination: Secondary | ICD-10-CM | POA: Diagnosis not present

## 2021-04-04 DIAGNOSIS — G459 Transient cerebral ischemic attack, unspecified: Secondary | ICD-10-CM | POA: Diagnosis not present

## 2021-04-05 DIAGNOSIS — G459 Transient cerebral ischemic attack, unspecified: Secondary | ICD-10-CM | POA: Diagnosis not present

## 2021-04-05 DIAGNOSIS — M6281 Muscle weakness (generalized): Secondary | ICD-10-CM | POA: Diagnosis not present

## 2021-04-05 DIAGNOSIS — R279 Unspecified lack of coordination: Secondary | ICD-10-CM | POA: Diagnosis not present

## 2021-04-05 DIAGNOSIS — R262 Difficulty in walking, not elsewhere classified: Secondary | ICD-10-CM | POA: Diagnosis not present

## 2021-04-06 DIAGNOSIS — R279 Unspecified lack of coordination: Secondary | ICD-10-CM | POA: Diagnosis not present

## 2021-04-06 DIAGNOSIS — R262 Difficulty in walking, not elsewhere classified: Secondary | ICD-10-CM | POA: Diagnosis not present

## 2021-04-06 DIAGNOSIS — M6281 Muscle weakness (generalized): Secondary | ICD-10-CM | POA: Diagnosis not present

## 2021-04-06 DIAGNOSIS — G459 Transient cerebral ischemic attack, unspecified: Secondary | ICD-10-CM | POA: Diagnosis not present

## 2021-04-07 DIAGNOSIS — M6281 Muscle weakness (generalized): Secondary | ICD-10-CM | POA: Diagnosis not present

## 2021-04-07 DIAGNOSIS — G459 Transient cerebral ischemic attack, unspecified: Secondary | ICD-10-CM | POA: Diagnosis not present

## 2021-04-07 DIAGNOSIS — R262 Difficulty in walking, not elsewhere classified: Secondary | ICD-10-CM | POA: Diagnosis not present

## 2021-04-07 DIAGNOSIS — R279 Unspecified lack of coordination: Secondary | ICD-10-CM | POA: Diagnosis not present

## 2021-04-09 DIAGNOSIS — R262 Difficulty in walking, not elsewhere classified: Secondary | ICD-10-CM | POA: Diagnosis not present

## 2021-04-09 DIAGNOSIS — R279 Unspecified lack of coordination: Secondary | ICD-10-CM | POA: Diagnosis not present

## 2021-04-09 DIAGNOSIS — M6281 Muscle weakness (generalized): Secondary | ICD-10-CM | POA: Diagnosis not present

## 2021-04-09 DIAGNOSIS — G459 Transient cerebral ischemic attack, unspecified: Secondary | ICD-10-CM | POA: Diagnosis not present

## 2021-04-10 DIAGNOSIS — R262 Difficulty in walking, not elsewhere classified: Secondary | ICD-10-CM | POA: Diagnosis not present

## 2021-04-10 DIAGNOSIS — G459 Transient cerebral ischemic attack, unspecified: Secondary | ICD-10-CM | POA: Diagnosis not present

## 2021-04-10 DIAGNOSIS — M6281 Muscle weakness (generalized): Secondary | ICD-10-CM | POA: Diagnosis not present

## 2021-04-10 DIAGNOSIS — R279 Unspecified lack of coordination: Secondary | ICD-10-CM | POA: Diagnosis not present

## 2021-04-11 DIAGNOSIS — R262 Difficulty in walking, not elsewhere classified: Secondary | ICD-10-CM | POA: Diagnosis not present

## 2021-04-11 DIAGNOSIS — G459 Transient cerebral ischemic attack, unspecified: Secondary | ICD-10-CM | POA: Diagnosis not present

## 2021-04-11 DIAGNOSIS — M6281 Muscle weakness (generalized): Secondary | ICD-10-CM | POA: Diagnosis not present

## 2021-04-11 DIAGNOSIS — R279 Unspecified lack of coordination: Secondary | ICD-10-CM | POA: Diagnosis not present

## 2021-04-12 DIAGNOSIS — R262 Difficulty in walking, not elsewhere classified: Secondary | ICD-10-CM | POA: Diagnosis not present

## 2021-04-12 DIAGNOSIS — R279 Unspecified lack of coordination: Secondary | ICD-10-CM | POA: Diagnosis not present

## 2021-04-12 DIAGNOSIS — G459 Transient cerebral ischemic attack, unspecified: Secondary | ICD-10-CM | POA: Diagnosis not present

## 2021-04-12 DIAGNOSIS — Z79899 Other long term (current) drug therapy: Secondary | ICD-10-CM | POA: Diagnosis not present

## 2021-04-12 DIAGNOSIS — M6281 Muscle weakness (generalized): Secondary | ICD-10-CM | POA: Diagnosis not present

## 2021-04-13 DIAGNOSIS — R279 Unspecified lack of coordination: Secondary | ICD-10-CM | POA: Diagnosis not present

## 2021-04-13 DIAGNOSIS — R262 Difficulty in walking, not elsewhere classified: Secondary | ICD-10-CM | POA: Diagnosis not present

## 2021-04-13 DIAGNOSIS — M6281 Muscle weakness (generalized): Secondary | ICD-10-CM | POA: Diagnosis not present

## 2021-04-13 DIAGNOSIS — G459 Transient cerebral ischemic attack, unspecified: Secondary | ICD-10-CM | POA: Diagnosis not present

## 2021-05-15 DIAGNOSIS — I10 Essential (primary) hypertension: Secondary | ICD-10-CM | POA: Diagnosis not present

## 2021-05-15 DIAGNOSIS — E785 Hyperlipidemia, unspecified: Secondary | ICD-10-CM | POA: Diagnosis not present

## 2021-05-15 DIAGNOSIS — F32A Depression, unspecified: Secondary | ICD-10-CM | POA: Diagnosis not present

## 2021-05-15 DIAGNOSIS — R69 Illness, unspecified: Secondary | ICD-10-CM | POA: Diagnosis not present

## 2021-05-16 DIAGNOSIS — F03A2 Unspecified dementia, mild, with psychotic disturbance: Secondary | ICD-10-CM | POA: Diagnosis not present

## 2021-05-16 DIAGNOSIS — R69 Illness, unspecified: Secondary | ICD-10-CM | POA: Diagnosis not present

## 2021-05-16 DIAGNOSIS — F321 Major depressive disorder, single episode, moderate: Secondary | ICD-10-CM | POA: Diagnosis not present

## 2021-05-16 DIAGNOSIS — G4700 Insomnia, unspecified: Secondary | ICD-10-CM | POA: Diagnosis not present

## 2021-06-15 DIAGNOSIS — I10 Essential (primary) hypertension: Secondary | ICD-10-CM | POA: Diagnosis not present

## 2021-06-15 DIAGNOSIS — E119 Type 2 diabetes mellitus without complications: Secondary | ICD-10-CM | POA: Diagnosis not present

## 2021-09-26 ENCOUNTER — Ambulatory Visit (INDEPENDENT_AMBULATORY_CARE_PROVIDER_SITE_OTHER): Payer: Medicare Other | Admitting: Orthopaedic Surgery

## 2021-09-26 ENCOUNTER — Ambulatory Visit (INDEPENDENT_AMBULATORY_CARE_PROVIDER_SITE_OTHER): Payer: Medicare Other

## 2021-09-26 ENCOUNTER — Encounter: Payer: Self-pay | Admitting: Orthopaedic Surgery

## 2021-09-26 VITALS — BP 114/74 | HR 71

## 2021-09-26 DIAGNOSIS — M7022 Olecranon bursitis, left elbow: Secondary | ICD-10-CM | POA: Diagnosis not present

## 2021-09-26 DIAGNOSIS — M25522 Pain in left elbow: Secondary | ICD-10-CM

## 2021-09-26 NOTE — Progress Notes (Signed)
Subjective:    Patient ID: Alfred Miller, male    DOB: 1950-12-09, 71 y.o.   MRN: [id number]  HPI He is patient at local nursing home.  He has developed over time a left olecranon swelling, no redness, no trauma.  I have reviewed the notes from [place].  He has had amputation right above the knee.  He has multiple medical problems.  He has no numbness of the left elbow, no pain.   Review of Systems  Constitutional:  Positive for activity change.  Musculoskeletal:  Positive for arthralgias, joint swelling and myalgias.  All other systems reviewed and are negative. For Review of Systems, all other systems reviewed and are negative.  The following is a summary of the past history medically, past history surgically, known current medicines, social history and family history.  This information is gathered electronically by the computer from prior information and documentation.  I review this each visit and have found including this information at this point in the chart is beneficial and informative.   Past Medical History:  Diagnosis Date   Anginal pain ([place])    Aortic root dilatation ([facility])    Aortic root 44 mm, echo, October 08, 2010   Aortic valve sclerosis    No stenosis, echo, July, 2012   BPH (benign prostatic hyperplasia)    CAD (coronary artery disease)    Catheterization 2009, "some narrowing"   Diabetes mellitus    Dyspnea    Ejection fraction    EF 60%,Echo, October 08, 2010   Gout    Hypertension     Past Surgical History:  Procedure Laterality Date   AMPUTATION Left 11/04/2019   Procedure: LEFT BELOW KNEE AMPUTATION;  Surgeon: Aviva Signs, MD;  Location: [facility];  Service: General;  Laterality: Left;   CARDIAC CATHETERIZATION  2009   CIRCUMCISION  09/2009    Current Outpatient Medications on File Prior to Visit  Medication Sig Dispense Refill   amLODipine (NORVASC) 10 MG tablet Take 10 mg by mouth daily.      Ascorbic Acid 1000 MG  TBCR Take by mouth.     aspirin EC 81 MG tablet Take 81 mg by mouth daily. Reported on 10/06/2015     atorvastatin (LIPITOR) 10 MG tablet Take 1 tablet (10 mg total) by mouth daily.     cloNIDine (CATAPRES) 0.3 MG tablet Take 0.3 mg by mouth 2 (two) times daily.     glipiZIDE (GLUCOTROL) 10 MG tablet Take 1 tablet by mouth Twice daily.     lisinopril (PRINIVIL,ZESTRIL) 40 MG tablet Take 40 mg by mouth daily.      metFORMIN (GLUCOPHAGE) 1000 MG tablet Take 1,000 mg by mouth 2 (two) times daily.     MYRBETRIQ 25 MG TB24 tablet Take 25 mg by mouth daily.     NOVOLIN N RELION 100 UNIT/ML injection Inject 0.4 mLs (40 Units total) into the skin 2 (two) times daily before a meal. 10 mL 11   Nutritional Supplements (PROMOD) LIQD Take by mouth.     omeprazole (PRILOSEC) 20 MG capsule Take 20 mg by mouth daily.     ondansetron (ZOFRAN) 4 MG tablet Take 4 mg by mouth every 8 (eight) hours as needed.     oxyCODONE-acetaminophen (PERCOCET/ROXICET) 5-325 MG tablet Take 1 tablet by mouth every 4 (four) hours as needed for moderate pain. 15 tablet 0   pantoprazole (PROTONIX) 40 MG tablet Take 40 mg by mouth 2 (two) times daily.  polyethylene glycol (MIRALAX / GLYCOLAX) 17 g packet Take 17 g by mouth 2 (two) times daily. 14 each 0   sertraline (ZOLOFT) 50 MG tablet Take 150 mg by mouth daily.     traZODone (DESYREL) 50 MG tablet Take 50 mg by mouth at bedtime.     TRULICITY 1.5 IR/4.4RX SOPN Inject into the skin.     carvedilol (COREG) 12.5 MG tablet Take 12.5 mg by mouth 2 (two) times daily. (Patient not taking: Reported on 09/26/2021)     No current facility-administered medications on file prior to visit.    Social History   Socioeconomic History   Marital status: Married    Spouse name: Not on file   Number of children: Not on file   Years of education: Not on file   Highest education level: Not on file  Occupational History   Not on file  Tobacco Use   Smoking status: Former    Packs/day:  0.30    Years: 40.00    Total pack years: 12.00    Types: Cigarettes    Start date: 06/26/1968    Quit date: 03/18/2005    Years since quitting: 16.5   Smokeless tobacco: Never  Vaping Use   Vaping Use: Never used  Substance and Sexual Activity   Alcohol use: Not Currently    Alcohol/week: 0.0 standard drinks of alcohol   Drug use: Not Currently   Sexual activity: Not Currently  Other Topics Concern   Not on file  Social History Narrative   Not on file   Social Determinants of Health   Financial Resource Strain: Not on file  Food Insecurity: Not on file  Transportation Needs: Not on file  Physical Activity: Not on file  Stress: Not on file  Social Connections: Not on file  Intimate Partner Violence: Not on file    Family History  Problem Relation Age of Onset   Hypertension Father    Diabetes Father    Hypertension Mother    Diabetes Mother    Breast cancer Mother     BP 114/74   Pulse 71   There is no height or weight on file to calculate BMI.      Objective:   Physical Exam Vitals and nursing note reviewed. Exam conducted with a chaperone present.  Constitutional:      Appearance: He is well-developed.  HENT:     Head: Normocephalic and atraumatic.  Eyes:     Conjunctiva/sclera: Conjunctivae normal.     Pupils: Pupils are equal, round, and reactive to light.  Cardiovascular:     Rate and Rhythm: Normal rate and regular rhythm.  Pulmonary:     Effort: Pulmonary effort is normal.  Abdominal:     Palpations: Abdomen is soft.  Musculoskeletal:       Arms:     Cervical back: Normal range of motion and neck supple.       Legs:  Skin:    General: Skin is warm and dry.  Neurological:     Mental Status: He is alert and oriented to person, place, and time.     Cranial Nerves: No cranial nerve deficit.     Motor: No abnormal muscle tone.     Coordination: Coordination normal.     Deep Tendon Reflexes: Reflexes are normal and symmetric. Reflexes normal.   Psychiatric:        Behavior: Behavior normal.        Thought Content: Thought content normal.  Judgment: Judgment normal.   X-rays were done of the left elbow, reported separately.        Assessment & Plan:   Encounter Diagnoses  Name Primary?   Pain in left elbow Yes   Olecranon bursitis, left elbow    Procedure note: After permission from the patient, the left elbow as prepped.  I aspirated 20 cc of blood tinged fluid from the bursa by sterile technique, tolerated well.  I have filled out forms for the nursing home.  I told him the fluid my recur.  He may need surgical excision.  Return in one month.  Call if any problem.  Precautions discussed.  Electronically Signed Sanjuana Kava, MD 7/12/20239:42 AM

## 2021-10-24 ENCOUNTER — Ambulatory Visit: Payer: Medicare Other | Admitting: Orthopaedic Surgery

## 2021-10-30 ENCOUNTER — Ambulatory Visit (INDEPENDENT_AMBULATORY_CARE_PROVIDER_SITE_OTHER): Payer: Medicare Other | Admitting: Orthopaedic Surgery

## 2021-10-30 ENCOUNTER — Encounter: Payer: Self-pay | Admitting: Orthopaedic Surgery

## 2021-10-30 VITALS — BP 91/64 | HR 80

## 2021-10-30 DIAGNOSIS — E11622 Type 2 diabetes mellitus with other skin ulcer: Secondary | ICD-10-CM | POA: Insufficient documentation

## 2021-10-30 DIAGNOSIS — M7022 Olecranon bursitis, left elbow: Secondary | ICD-10-CM | POA: Diagnosis not present

## 2021-10-30 NOTE — Progress Notes (Signed)
My elbow is better.  He is from nursing home.  His left elbow bursa is very small today. It has not recurred fluid after the aspiration last time.  He has no pain.  ROM of the left elbow is full.  He has very small olecranon bursa, no redness.  NV intact.  Encounter Diagnosis  Name Primary?   Olecranon bursitis, left elbow Yes   I will see as needed.  Forms completed for the nursing home.  Call if any problem.  Precautions discussed.  Electronically Signed Sanjuana Kava, MD 8/15/202310:17 AM

## 2021-12-12 IMAGING — DX DG CHEST 2V
2 series · 3 of 3 positions shown · non-contrast
Comparison: 09/01/2019

CLINICAL DATA: Fell, left foot pain

EXAM:
CHEST - 2 VIEW

[Series 2: chest lat · 0.14mm/px · 2 of 2 slices shown]
[im 1/2]
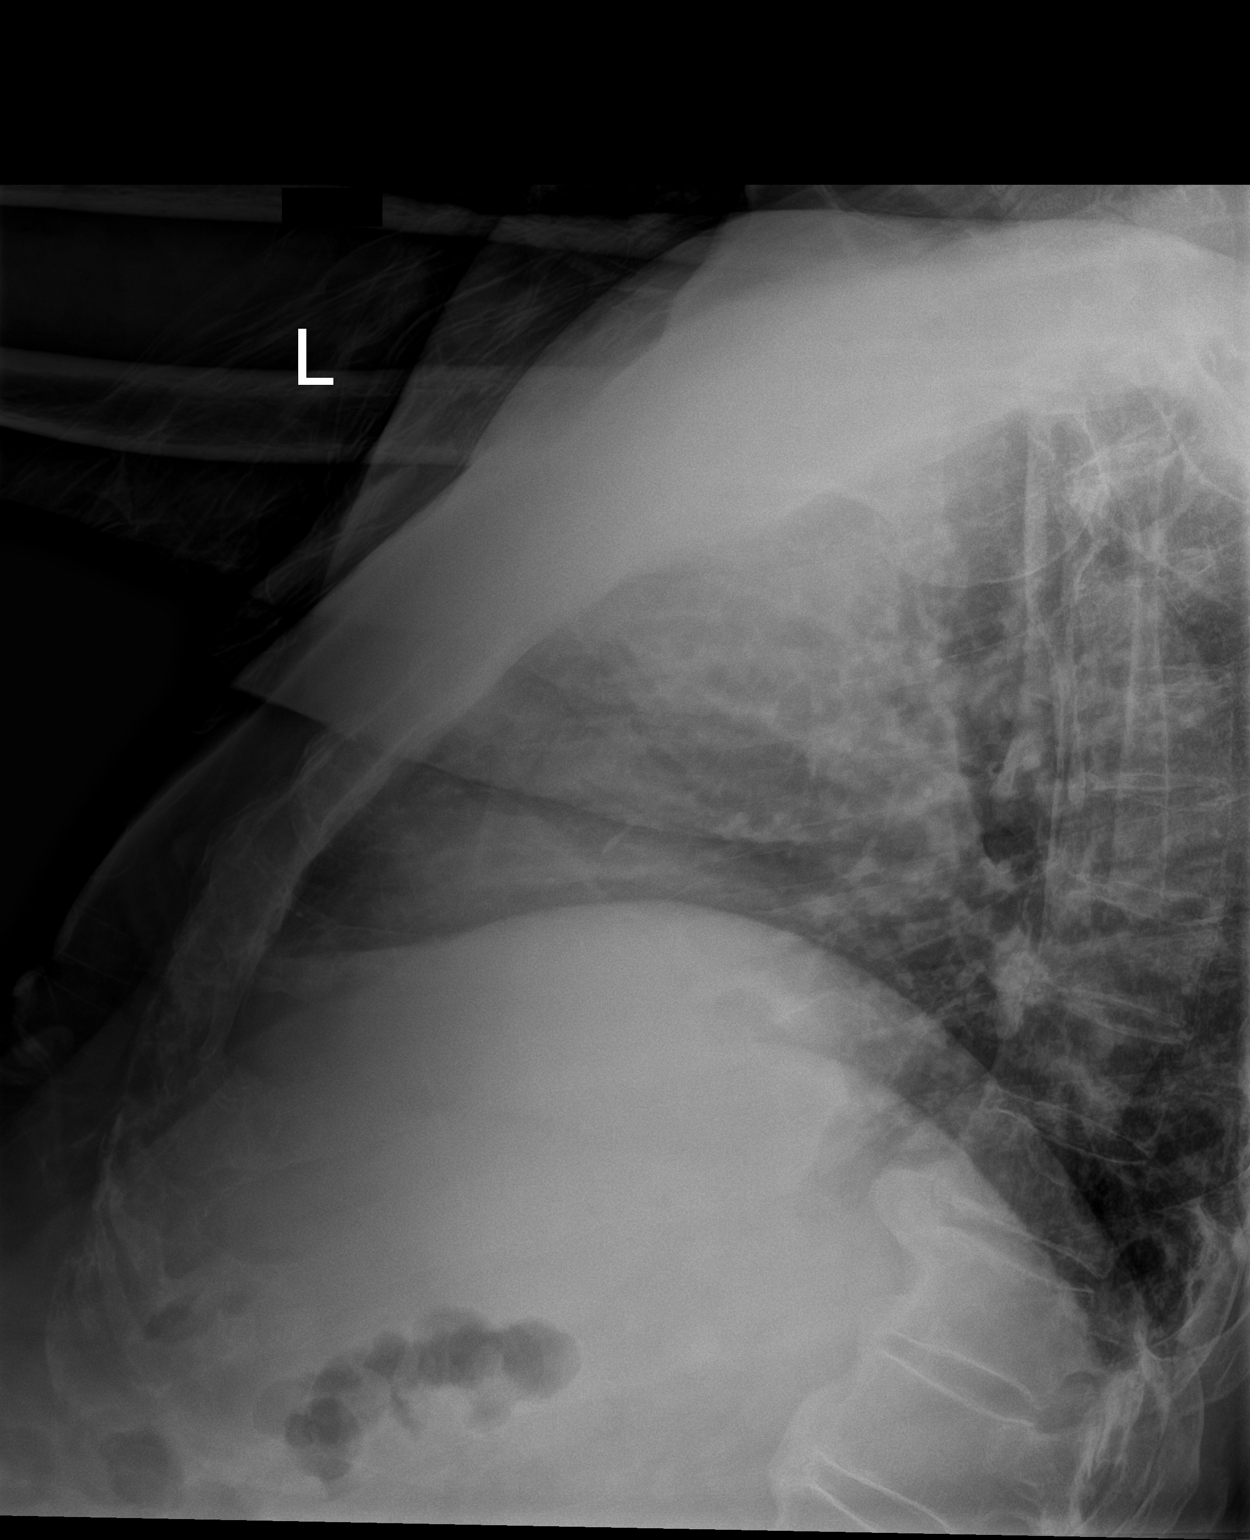
[im 2/2]
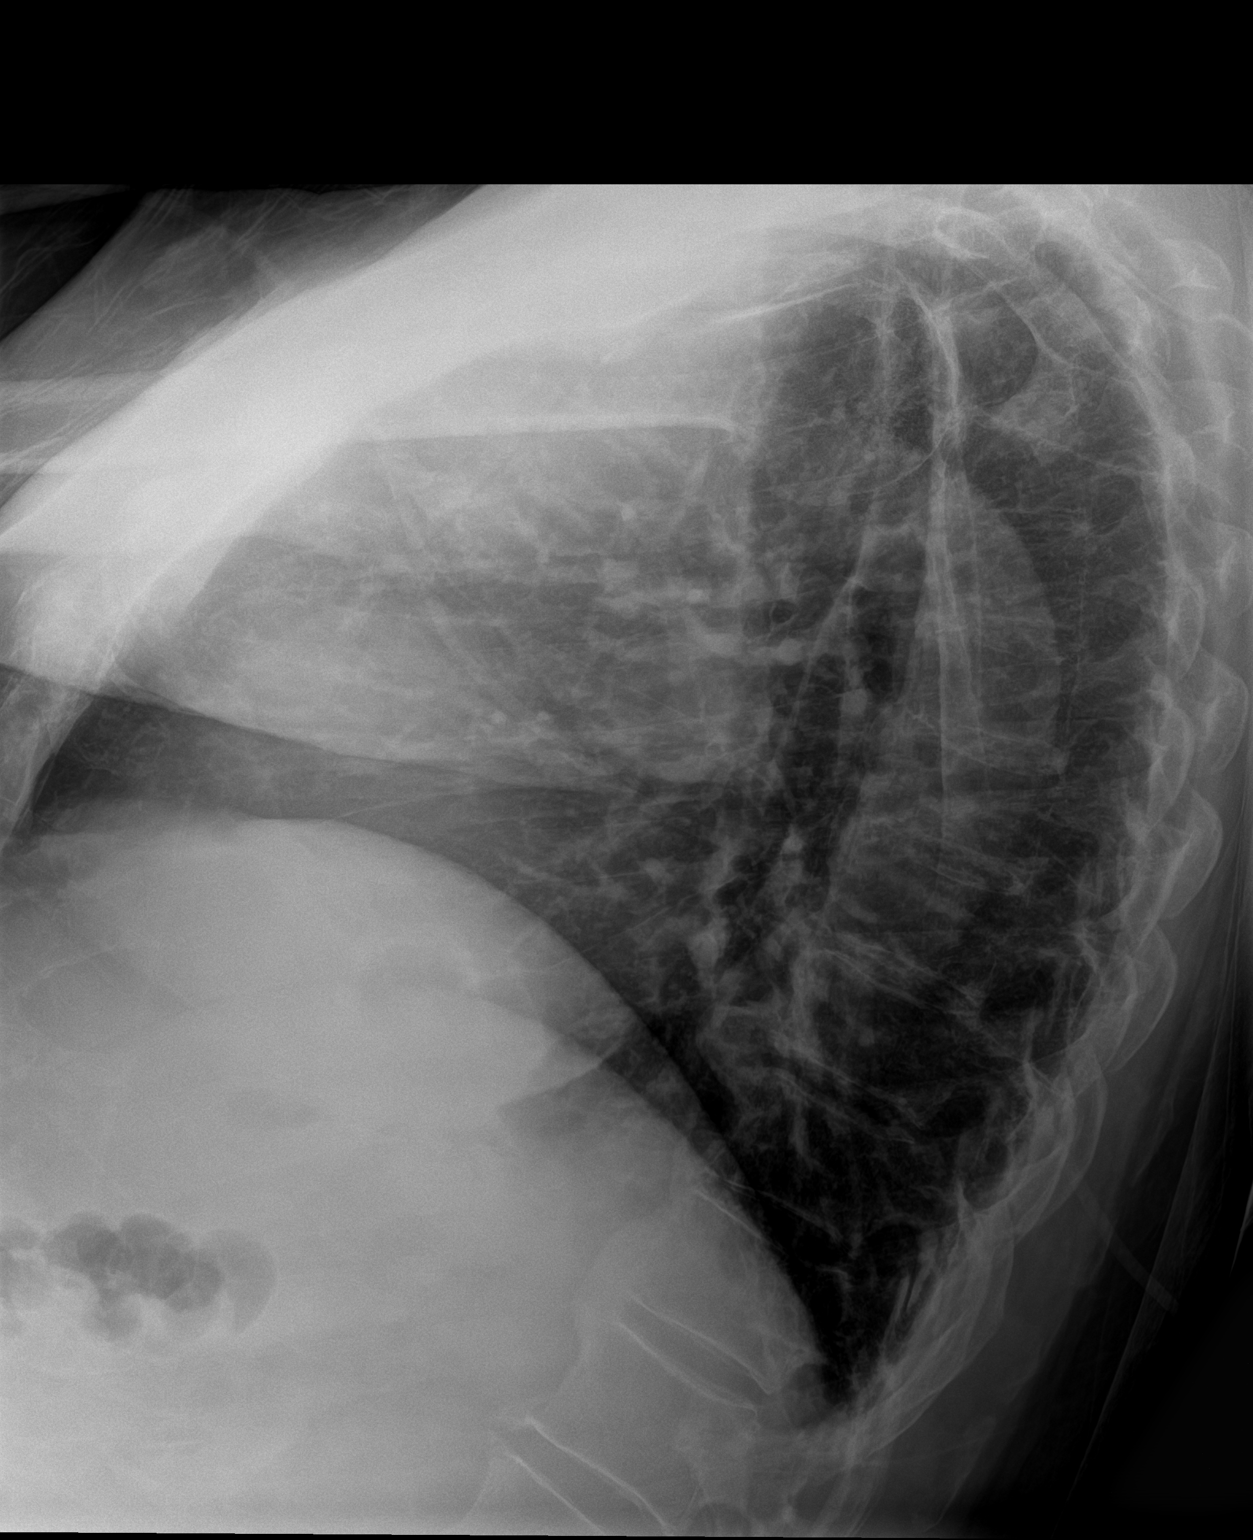

[chest ap]
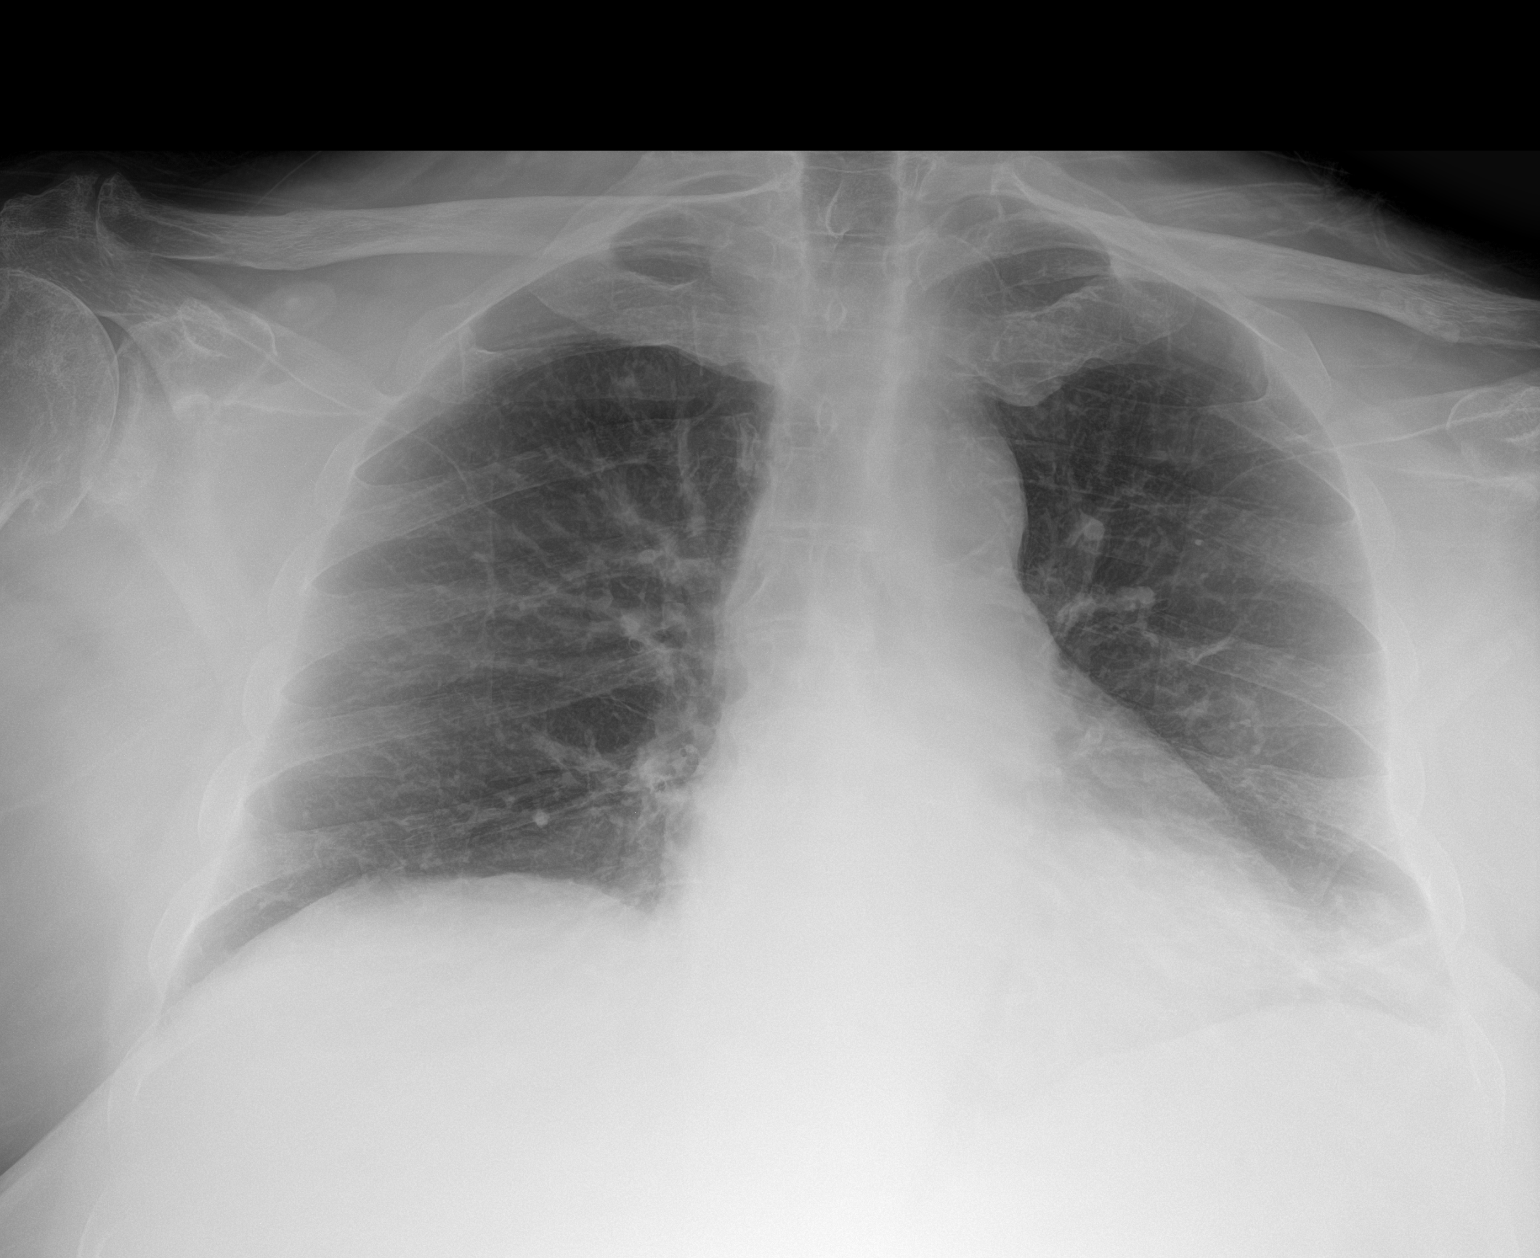

[3 of 3 positions shown; findings below may reference images not displayed]

FINDINGS: The heart size and mediastinal contours are within normal limits.
Both lungs are clear. The visualized skeletal structures are
unremarkable.
IMPRESSION: No active cardiopulmonary disease.

## 2022-08-27 ENCOUNTER — Encounter: Payer: Self-pay | Admitting: Orthopaedic Surgery

## 2022-08-27 ENCOUNTER — Ambulatory Visit (INDEPENDENT_AMBULATORY_CARE_PROVIDER_SITE_OTHER): Payer: Medicare Other | Admitting: Orthopaedic Surgery

## 2022-08-27 ENCOUNTER — Encounter: Payer: Self-pay | Admitting: *Deleted

## 2022-08-27 VITALS — BP 120/68 | HR 57

## 2022-08-27 DIAGNOSIS — L03113 Cellulitis of right upper limb: Secondary | ICD-10-CM

## 2022-08-27 NOTE — Progress Notes (Signed)
My other elbow is hurting.  I had seen him for olecranon bursitis on the left.  That has resolved. He has developed redness of the right elbow for several weeks.  He was on an antibiotic and that ended two weeks ago.  He is allergic to Keflex.  He has no trauma, no fever, no chills.  He is in a nursing home.  I have reviewed the noted.  The right elbow has full ROM.  He has redness of the right olecranon bursa area with little swelling of the bursa.  NV intact.  Encounter Diagnosis  Name Primary?   Cellulitis of right elbow Yes   I will order doxycyline 50 mgm po bid.  Return in one week.  Call if any problem.  Precautions discussed. Electronically Signed Darreld Mclean, MD 6/11/20241:39 PM

## 2022-08-28 ENCOUNTER — Encounter: Payer: Self-pay | Admitting: Internal Medicine

## 2022-08-28 ENCOUNTER — Other Ambulatory Visit: Payer: Self-pay

## 2022-08-28 ENCOUNTER — Ambulatory Visit: Payer: Medicare Other | Attending: Internal Medicine | Admitting: Internal Medicine

## 2022-08-28 VITALS — BP 96/58 | HR 64 | Ht 74.0 in | Wt 190.0 lb

## 2022-08-28 DIAGNOSIS — Z79899 Other long term (current) drug therapy: Secondary | ICD-10-CM

## 2022-08-28 DIAGNOSIS — Z8673 Personal history of transient ischemic attack (TIA), and cerebral infarction without residual deficits: Secondary | ICD-10-CM | POA: Diagnosis not present

## 2022-08-28 MED ORDER — ISOSORBIDE MONONITRATE ER 30 MG PO TB24: 30.0000 mg | ORAL_TABLET | Freq: Every day | ORAL | 3 refills | Status: AC

## 2022-08-28 MED ORDER — CLONIDINE HCL 0.2 MG PO TABS: ORAL_TABLET | ORAL | 0 refills | Status: DC

## 2022-08-28 NOTE — Progress Notes (Signed)
Cardiology Office Note  Date: 08/28/2022   ID: Alfred Miller, DOB 27-May-1950, MRN [id number]  PCP:  Charlynne Pander, MD  Cardiologist:  None Electrophysiologist:  None   Reason for Office Visit: Evaluation chest pain at the request of Dr.Vyas   History of Present Illness: Alfred Miller is a 72 y.o. male known to have HTN, poorly controlled DM2 c/w bilateral lower extremity amputations, LLE PAD s/p left lower extremity amputation, aorta dilatation (aortic root 39 mm and ascending aorta 44 mm in echo 2022), history of CVA in 2022, was referred to cardiology clinic for evaluation chest pain.  Accompanied by [employer].  Currently patient lives in a facility.  He is wheelchair-bound.  He has ongoing substernal chest pain radiating to bilateral hands associated with numbness in his fingertips x 6 months. Last for 10 to 15 minutes. Occurs mostly at rest, when he lays on his bed and sometimes when he wheels his wheelchair. Partially relieved by sublingual nitroglycerin. No other symptoms of SOB, dizziness, palpitations, syncope.   Past Medical History:  Diagnosis Date   Anginal pain (HCC)    Aortic root dilatation (HCC)    Aortic root 44 mm, echo, October 08, 2010   Aortic valve sclerosis    No stenosis, echo, July, 2012   BPH (benign prostatic hyperplasia)    CAD (coronary artery disease)    Catheterization 2009, "some narrowing"   Diabetes mellitus    Dyspnea    Ejection fraction    EF 60%,Echo, October 08, 2010   Gout    Hypertension     Past Surgical History:  Procedure Laterality Date   AMPUTATION Left 11/04/2019   Procedure: LEFT BELOW KNEE AMPUTATION;  Surgeon: Franky Macho, MD;  Location: [facility];  Service: General;  Laterality: Left;   CARDIAC CATHETERIZATION  2009   CIRCUMCISION  09/2009    Current Outpatient Medications  Medication Sig Dispense Refill   amLODipine (NORVASC) 10 MG tablet Take 10 mg by mouth daily.      Ascorbic Acid 1000 MG TBCR Take by  mouth.     aspirin EC 81 MG tablet Take 81 mg by mouth daily. Reported on 10/06/2015     atorvastatin (LIPITOR) 10 MG tablet Take 1 tablet (10 mg total) by mouth daily.     cloNIDine (CATAPRES) 0.2 MG tablet Take 1 tablet (0.2 mg total) by mouth 2 (two) times daily for 30 days, THEN 0.5 tablets (0.1 mg total) 2 (two) times daily. 30 tablet 0   cloNIDine (CATAPRES) 0.3 MG tablet Take 0.3 mg by mouth 2 (two) times daily.     empagliflozin (JARDIANCE) 25 MG TABS tablet Take by mouth daily.     glipiZIDE (GLUCOTROL) 10 MG tablet Take 1 tablet by mouth Twice daily.     isosorbide mononitrate (IMDUR) 30 MG 24 hr tablet Take 1 tablet (30 mg total) by mouth daily. 90 tablet 3   lisinopril (PRINIVIL,ZESTRIL) 40 MG tablet Take 40 mg by mouth daily.      metFORMIN (GLUCOPHAGE) 1000 MG tablet Take 1,000 mg by mouth 2 (two) times daily.     MYRBETRIQ 25 MG TB24 tablet Take 25 mg by mouth daily.     NOVOLIN N RELION 100 UNIT/ML injection Inject 0.4 mLs (40 Units total) into the skin 2 (two) times daily before a meal. 10 mL 11   Nutritional Supplements (PROMOD) LIQD Take by mouth.     ondansetron (ZOFRAN) 4 MG tablet Take 4 mg by mouth every 8 (  eight) hours as needed.     polyethylene glycol (MIRALAX / GLYCOLAX) 17 g packet Take 17 g by mouth 2 (two) times daily. 14 each 0   rOPINIRole (REQUIP) 1 MG tablet Take 1 mg by mouth 3 (three) times daily.     sertraline (ZOLOFT) 50 MG tablet Take 150 mg by mouth daily.     traZODone (DESYREL) 50 MG tablet Take 50 mg by mouth at bedtime.     omeprazole (PRILOSEC) 20 MG capsule Take 20 mg by mouth daily. (Patient not taking: Reported on 08/28/2022)     oxyCODONE-acetaminophen (PERCOCET/ROXICET) 5-325 MG tablet Take 1 tablet by mouth every 4 (four) hours as needed for moderate pain. (Patient not taking: Reported on 08/28/2022) 15 tablet 0   pantoprazole (PROTONIX) 40 MG tablet Take 40 mg by mouth 2 (two) times daily. (Patient not taking: Reported on 08/28/2022)     TRULICITY  1.5 MG/0.5ML SOPN Inject into the skin. (Patient not taking: Reported on 08/28/2022)     No current facility-administered medications for this visit.   Allergies:  Augmentin [amoxicillin-pot clavulanate], Cephalexin, and Ciprofloxacin   Social History: The patient  reports that he quit smoking about 17 years ago. His smoking use included cigarettes. He started smoking about 54 years ago. He has a 12.00 pack-year smoking history. He has never used smokeless tobacco. He reports that he does not currently use alcohol. He reports that he does not currently use drugs.   Family History: The patient's family history includes Breast cancer in his mother; Diabetes in his father and mother; Hypertension in his father and mother.   ROS:  Please see the history of present illness. Otherwise, complete review of systems is positive for none.  All other systems are reviewed and negative.   Physical Exam: VS:  BP (!) 96/58   Pulse 64   Ht 6\' 2"  (1.88 m)   Wt 190 lb (86.2 kg)   SpO2 94%   BMI 24.39 kg/m , BMI Body mass index is 24.39 kg/m.  Wt Readings from Last 3 Encounters:  08/28/22 190 lb (86.2 kg)  05/11/20 250 lb (113.4 kg)  03/30/20 243 lb (110.2 kg)    General: Patient appears comfortable at rest. HEENT: Conjunctiva and lids normal, oropharynx clear with moist mucosa. Neck: Supple, no elevated JVP or carotid bruits, no thyromegaly. Lungs: Clear to auscultation, nonlabored breathing at rest. Cardiac: Regular rate and rhythm, no S3 or significant systolic murmur, no pericardial rub. Abdomen: Soft, nontender, no hepatomegaly, bowel sounds present, no guarding or rebound. Extremities: Bilateral lower EXTR amputations Skin: Warm and dry. Musculoskeletal: No kyphosis. Neuropsychiatric: Alert and oriented.  Recent Labwork: No results found for requested labs within last 365 days.  No results found for: "CHOL", "TRIG", "HDL", "CHOLHDL", "VLDL", "LDLCALC", "LDLDIRECT"  Other Studies Reviewed  Today: Echo in 09-22-2020 Summary   1. The left ventricle is normal in size with mildly to moderately increased  wall thickness.    2. The left ventricular systolic function is normal, LVEF is visually  estimated at > 55%.    3. There is grade I diastolic dysfunction (impaired relaxation).    4. The left atrium is mildly dilated in size.    5. The right ventricle is normal in size, with normal systolic function.    6. Dilated aortic root and ascending aorta measuring 3.9 and 4.4 cm  respectively.   7. No evidence of valvular or endocardial vegetation.   Assessment and Plan:  Patient is a 72 year old M  known to have HTN, poorly controlled DM2 c/w bilateral lower extremity amputations, LLE PAD s/p left lower extremity amputation, aorta dilatation (aortic root 39 mm and ascending aorta 44 mm in echo 2022), history of CVA in 2022, was referred to cardiology clinic for evaluation chest pain.   # Mixed chest pain, cardiac and noncardiac (GERD related) # Aorta dilatation (aortic root 39 mm and ascending aorta 44 mm on echo from 2022) -Obtain BMP/CMP from the facility.  If serum creatinine is normal, he will benefit from CTA cardiac to rule out significant CAD and for aorta dilatation. If serum creatinine is abnormal, he will benefit from 2D echocardiogram and NM stress test Eugenie Birks).  Will start Imdur 30 mg once daily.  # HTN, controlled -Wean off clonidine, tapering regimen provided.  Otherwise, continue current antiemetic medications like amlodipine 10 mg once daily and lisinopril 40 mg once daily.  # History of CVA -Continue aspirin 81 mg once daily and atorvastatin 10 mg nightly.  # HLD -Continue atorvastatin 10 mg nightly.  Goal LDL less than 70.   I have spent a total of 45 minutes with patient reviewing chart, EKGs, labs and examining patient as well as establishing an assessment and plan that was discussed with the patient.  > 50% of time was spent in direct patient care.    Medication  Adjustments/Labs and Tests Ordered: Current medicines are reviewed at length with the patient today.  Concerns regarding medicines are outlined above.   Tests Ordered: Orders Placed This Encounter  Procedures   Comprehensive metabolic panel    Medication Changes: Meds ordered this encounter  Medications   isosorbide mononitrate (IMDUR) 30 MG 24 hr tablet    Sig: Take 1 tablet (30 mg total) by mouth daily.    Dispense:  90 tablet    Refill:  3    08/28/2022- New   cloNIDine (CATAPRES) 0.2 MG tablet    Sig: Take 1 tablet (0.2 mg total) by mouth 2 (two) times daily for 30 days, THEN 0.5 tablets (0.1 mg total) 2 (two) times daily.    Dispense:  30 tablet    Refill:  0    Disposition:  Follow up  6 months  Signed, Safiyya Stokes Verne Spurr, MD, 08/28/2022 2:57 PM    [facility] at [facility] [address] [place], [place] [address]

## 2022-08-28 NOTE — Patient Instructions (Addendum)
Medication Instructions:  Your physician has recommended you make the following change in your medication:  Start Isosorbide 30 mg daily Wean off clonidine as directed  Labwork: CMP  Testing/Procedures: Pending lab results  Follow-Up: Your physician recommends that you schedule a follow-up appointment in: 6 months  Any Other Special Instructions Will Be Listed Below (If Applicable).  If you need a refill on your cardiac medications before your next appointment, please call your pharmacy.

## 2022-08-29 NOTE — Addendum Note (Signed)
Addended by: Marlyn Corporal A on: 08/29/2022 10:51 AM   Modules accepted: Orders

## 2022-09-02 NOTE — Progress Notes (Signed)
Order(s) created erroneously. Erroneous order ID: [id number]  Order moved by: Leonia Corona  Order move date/time: 09/02/2022 5:20 PM  Source Patient: [id number]  Source Contact: 08/28/2022  Destination Patient: [id number]  Destination Contact: 08/28/2022

## 2022-09-03 ENCOUNTER — Ambulatory Visit: Payer: Medicare Other | Admitting: Orthopaedic Surgery

## 2023-03-06 ENCOUNTER — Ambulatory Visit: Payer: Medicare Other | Admitting: Internal Medicine

## 2023-04-17 ENCOUNTER — Encounter: Payer: Self-pay | Admitting: Internal Medicine

## 2023-04-17 ENCOUNTER — Ambulatory Visit: Payer: Medicare Other | Attending: Internal Medicine | Admitting: Internal Medicine

## 2023-04-17 VITALS — HR 73 | Ht 74.0 in

## 2023-04-17 DIAGNOSIS — R079 Chest pain, unspecified: Secondary | ICD-10-CM | POA: Insufficient documentation

## 2023-04-17 DIAGNOSIS — I716 Thoracoabdominal aortic aneurysm, without rupture, unspecified: Secondary | ICD-10-CM | POA: Insufficient documentation

## 2023-04-17 DIAGNOSIS — Z01810 Encounter for preprocedural cardiovascular examination: Secondary | ICD-10-CM | POA: Diagnosis present

## 2023-04-17 DIAGNOSIS — I719 Aortic aneurysm of unspecified site, without rupture: Secondary | ICD-10-CM | POA: Diagnosis present

## 2023-04-17 DIAGNOSIS — Z136 Encounter for screening for cardiovascular disorders: Secondary | ICD-10-CM | POA: Insufficient documentation

## 2023-04-17 NOTE — Progress Notes (Signed)
Cardiology Office Note  Date: 04/17/2023   ID: Alfred Miller, DOB 27-Dec-1950, MRN [id number]  PCP:  Charlynne Pander, MD  Cardiologist:  Marjo Bicker, MD Electrophysiologist:  None    History of Present Illness: Alfred Miller is a 73 y.o. male known to have HTN, poorly controlled DM2 c/w bilateral lower extremity amputations, LLE PAD s/p left lower extremity amputation, aorta dilatation (aortic root 39 mm and ascending aorta 44 mm in echo 2022), history of CVA in 2022, was referred to cardiology clinic for evaluation chest pain.  Accompanied by [employer].  Currently patient lives in a facility.  He is wheelchair-bound.  Continues to have chest pains with stress and with exertion (when he propels his wheelchair).  Occurs frequently, few times per week and sometimes daily.  Last for about 10 to 15 minutes.  No other symptoms of SOB, syncope.  He does get palpitations when he gets upset with his roommate.  He gets dizzy occasionally.   Past Medical History:  Diagnosis Date   Anginal pain (HCC)    Aortic root dilatation (HCC)    Aortic root 44 mm, echo, October 08, 2010   Aortic valve sclerosis    No stenosis, echo, July, 2012   BPH (benign prostatic hyperplasia)    CAD (coronary artery disease)    Catheterization 2009, "some narrowing"   Diabetes mellitus    Dyspnea    Ejection fraction    EF 60%,Echo, October 08, 2010   Gout    Hypertension     Past Surgical History:  Procedure Laterality Date   AMPUTATION Left 11/04/2019   Procedure: LEFT BELOW KNEE AMPUTATION;  Surgeon: Franky Macho, MD;  Location: [facility];  Service: General;  Laterality: Left;   CARDIAC CATHETERIZATION  2009   CIRCUMCISION  09/2009    Current Outpatient Medications  Medication Sig Dispense Refill   acetaminophen (TYLENOL) 325 MG tablet Take 650 mg by mouth every 4 (four) hours as needed.     amLODipine (NORVASC) 10 MG tablet Take 10 mg by mouth daily.      aspirin EC 81 MG tablet Take  81 mg by mouth daily. Reported on 10/06/2015     atorvastatin (LIPITOR) 10 MG tablet Take 1 tablet (10 mg total) by mouth daily.     cholecalciferol (VITAMIN D3) 25 MCG (1000 UNIT) tablet Take 1,000 Units by mouth daily.     empagliflozin (JARDIANCE) 25 MG TABS tablet Take 25 mg by mouth daily.     ferrous sulfate 325 (65 FE) MG EC tablet Take 325 mg by mouth daily with breakfast.     gabapentin (NEURONTIN) 100 MG capsule Take 100 mg by mouth 3 (three) times daily.     glipiZIDE (GLUCOTROL) 5 MG tablet Take 5 mg by mouth daily before breakfast.     ibuprofen (ADVIL) 200 MG tablet Take 200 mg by mouth every 8 (eight) hours as needed.     insulin lispro (HUMALOG) 100 UNIT/ML KwikPen Inject 5-10 Units into the skin as directed. Per sliding scale     isosorbide mononitrate (IMDUR) 30 MG 24 hr tablet Take 1 tablet (30 mg total) by mouth daily. 90 tablet 3   LANTUS SOLOSTAR 100 UNIT/ML Solostar Pen Inject 20 Units into the skin daily.     lisinopril (PRINIVIL,ZESTRIL) 40 MG tablet Take 40 mg by mouth daily.      metFORMIN (GLUCOPHAGE) 1000 MG tablet Take 500 mg by mouth 2 (two) times daily.     Multiple  Vitamin (MULTIVITAMIN) tablet Take 1 tablet by mouth daily.     MYRBETRIQ 25 MG TB24 tablet Take 25 mg by mouth daily.     polyethylene glycol (MIRALAX / GLYCOLAX) 17 g packet Take 17 g by mouth daily.     Probiotic Product (PROBIOTIC DAILY PO) Take 1 tablet by mouth daily.     risperiDONE (RISPERDAL) 0.5 MG tablet Take 0.5 mg by mouth 2 (two) times daily.     rOPINIRole (REQUIP) 1 MG tablet Take 1 mg by mouth at bedtime.     sertraline (ZOLOFT) 100 MG tablet Take 100 mg by mouth daily.     traZODone (DESYREL) 100 MG tablet Take 100 mg by mouth at bedtime.     No current facility-administered medications for this visit.   Allergies:  Augmentin [amoxicillin-pot clavulanate], Cephalexin, and Ciprofloxacin   Social History: The patient  reports that he quit smoking about 18 years ago. His smoking use  included cigarettes. He started smoking about 54 years ago. He has a 12 pack-year smoking history. He has never used smokeless tobacco. He reports that he does not currently use alcohol. He reports that he does not currently use drugs.   Family History: The patient's family history includes Breast cancer in his mother; Diabetes in his father and mother; Hypertension in his father and mother.   ROS:  Please see the history of present illness. Otherwise, complete review of systems is positive for none.  All other systems are reviewed and negative.   Physical Exam: VS:  Pulse 73   Ht 6\' 2"  (1.88 m)   SpO2 96%   BMI 24.39 kg/m , BMI Body mass index is 24.39 kg/m.  Wt Readings from Last 3 Encounters:  08/28/22 190 lb (86.2 kg)  05/11/20 250 lb (113.4 kg)  03/30/20 243 lb (110.2 kg)    General: Patient appears comfortable at rest. HEENT: Conjunctiva and lids normal, oropharynx clear with moist mucosa. Neck: Supple, no elevated JVP or carotid bruits, no thyromegaly. Lungs: Clear to auscultation, nonlabored breathing at rest. Cardiac: Regular rate and rhythm, no S3 or significant systolic murmur, no pericardial rub. Abdomen: Soft, nontender, no hepatomegaly, bowel sounds present, no guarding or rebound. Extremities: Bilateral lower EXTR amputations Skin: Warm and dry. Musculoskeletal: No kyphosis. Neuropsychiatric: Alert and oriented.  Recent Labwork: No results found for requested labs within last 365 days.  No results found for: "CHOL", "TRIG", "HDL", "CHOLHDL", "VLDL", "LDLCALC", "LDLDIRECT"  Other Studies Reviewed Today: Echo in 2022 Summary   1. The left ventricle is normal in size with mildly to moderately increased  wall thickness.    2. The left ventricular systolic function is normal, LVEF is visually  estimated at > 55%.    3. There is grade I diastolic dysfunction (impaired relaxation).    4. The left atrium is mildly dilated in size.    5. The right ventricle is normal  in size, with normal systolic function.    6. Dilated aortic root and ascending aorta measuring 3.9 and 4.4 cm  respectively.   7. No evidence of valvular or endocardial vegetation.   Assessment and Plan:   Chest pain: Obtain Lexiscan and echocardiogram.  Continue Imdur 30 mg once daily. SL NTG PRN.  Ascending aorta dilatation 4.4 cm in 2022: Serum creatinine was within normal limits in the last year.  Obtain CT chest/aorta for monitoring the aortic dilatation.  HTN, controlled: Continue current antihypertensives, amlodipine 10 mg once daily, lisinopril 40 mg once daily, Imdur 30 mg  once daily.  DM2: Follows with PCP.  History of CVA: Continue aspirin 81 mg once daily and atorvastatin 10 mg nightly.  HLD: Continue atorvastatin 10 mg nightly, goal LDL less than 70.  Lipid panel can be checked by PCP.     Medication Adjustments/Labs and Tests Ordered: Current medicines are reviewed at length with the patient today.  Concerns regarding medicines are outlined above.   Tests Ordered: Orders Placed This Encounter  Procedures   EKG 12-Lead    Medication Changes: No orders of the defined types were placed in this encounter.   Disposition:  Follow up   10 months  Signed, Joan Herschberger Verne Spurr, MD, 04/17/2023 10:56 AM    [facility] at [facility] [address] [place], [place] [address]

## 2023-04-17 NOTE — Patient Instructions (Addendum)
Medication Instructions:  Your physician recommends that you continue on your current medications as directed. Please refer to the Current Medication list given to you today.   Labwork: BMET to be completed 1-2 weeks prior to CTA at [facility] or [facility]  Testing/Procedures: Non-Cardiac CT Angiography (CTA), is a special type of CT scan that uses a computer to produce multi-dimensional views of major blood vessels throughout the body. In CT angiography, a contrast material is injected through an IV to help visualize the blood vessels  Your physician has requested that you have an echocardiogram. Echocardiography is a painless test that uses sound waves to create images of your heart. It provides your doctor with information about the size and shape of your heart and how well your heart's chambers and valves are working. This procedure takes approximately one hour. There are no restrictions for this procedure. Please do NOT wear cologne, perfume, aftershave, or lotions (deodorant is allowed). Please arrive 15 minutes prior to your appointment time.  Please note: We ask at that you not bring children with you during ultrasound (echo/ vascular) testing. Due to room size and safety concerns, children are not allowed in the ultrasound rooms during exams. Our front office staff cannot provide observation of children in our lobby area while testing is being conducted. An adult accompanying a patient to their appointment will only be allowed in the ultrasound room at the discretion of the ultrasound technician under special circumstances. We apologize for any inconvenience.  Your physician has requested that you have a lexiscan myoview. For further information please visit [email or web address] Please follow instruction sheet, as given.   Follow-Up: Your physician recommends that you schedule a follow-up appointment in: 10 months  Any Other Special Instructions Will Be Listed Below (If  Applicable). Thank you for choosing [facility]!      If you need a refill on your cardiac medications before your next appointment, please call your pharmacy.

## 2023-04-25 ENCOUNTER — Ambulatory Visit (HOSPITAL_COMMUNITY): Admission: RE | Admit: 2023-04-25 | Payer: Medicare Other | Source: Ambulatory Visit

## 2023-04-25 ENCOUNTER — Encounter (HOSPITAL_COMMUNITY)
Admission: RE | Admit: 2023-04-25 | Discharge: 2023-04-25 | Disposition: A | Discharge status: Home / Self Care | Payer: Medicare Other | Source: Ambulatory Visit | Attending: Internal Medicine | Admitting: Internal Medicine

## 2023-04-25 ENCOUNTER — Encounter (HOSPITAL_COMMUNITY): Payer: Self-pay

## 2023-04-25 DIAGNOSIS — R079 Chest pain, unspecified: Secondary | ICD-10-CM | POA: Insufficient documentation

## 2023-04-28 ENCOUNTER — Other Ambulatory Visit: Payer: Medicare Other

## 2023-05-05 ENCOUNTER — Ambulatory Visit: Payer: Medicare Other | Attending: Internal Medicine

## 2023-05-05 DIAGNOSIS — R079 Chest pain, unspecified: Secondary | ICD-10-CM | POA: Diagnosis not present

## 2023-05-05 LAB — ECHOCARDIOGRAM COMPLETE
AR max vel: 3.91 cm2
AV Area VTI: 3.82 cm2
AV Area mean vel: 3.6 cm2
AV Mean grad: 5 mm[Hg]
AV Peak grad: 8 mm[Hg]
Ao pk vel: 1.41 m/s
Area-P 1/2: 2.1 cm2
Calc EF: 55.6 %
MV VTI: 4.8 cm2
S' Lateral: 1.9 cm
Single Plane A2C EF: 58.9 %
Single Plane A4C EF: 51.5 %

## 2023-05-06 ENCOUNTER — Ambulatory Visit (HOSPITAL_COMMUNITY): Admission: RE | Admit: 2023-05-06 | Payer: Medicare Other | Source: Ambulatory Visit

## 2023-05-14 ENCOUNTER — Ambulatory Visit (HOSPITAL_COMMUNITY)
Admission: RE | Admit: 2023-05-14 | Discharge: 2023-05-14 | Disposition: A | Discharge status: Home / Self Care | Payer: Medicare Other | Source: Ambulatory Visit | Attending: Internal Medicine | Admitting: Internal Medicine

## 2023-05-14 ENCOUNTER — Inpatient Hospital Stay (HOSPITAL_COMMUNITY)
Admission: RE | Admit: 2023-05-14 | Discharge: 2023-05-14 | Disposition: A | Discharge status: Home / Self Care | Payer: Medicare Other | Source: Ambulatory Visit | Attending: Internal Medicine | Admitting: Internal Medicine

## 2023-05-14 ENCOUNTER — Telehealth: Payer: Self-pay | Admitting: Physician Assistant

## 2023-05-14 DIAGNOSIS — R079 Chest pain, unspecified: Secondary | ICD-10-CM | POA: Diagnosis not present

## 2023-05-14 LAB — NM MYOCAR MULTI W/SPECT W/WALL MOTION / EF
Base ST Depression (mm): 0 mm
Estimated workload: 1
Exercise duration (min): 0 min
Exercise duration (sec): 0 s
LV dias vol: 80 mL (ref 62–150)
LV sys vol: 22 mL
MPHR: 148 {beats}/min
Nuc Stress EF: 73 %
Peak HR: 78 {beats}/min
Percent HR: 52 %
RATE: 73
Rest HR: 56 {beats}/min
Rest Nuclear Isotope Dose: 10 mCi
SDS: 2
SRS: 0
SSS: 2
ST Depression (mm): 0 mm
Stress Nuclear Isotope Dose: 30 mCi
TID: 0.94

## 2023-05-14 MED ORDER — SODIUM CHLORIDE FLUSH 0.9 % IV SOLN
INTRAVENOUS | Status: AC
  Administered 2023-05-14: 10 mL via INTRAVENOUS
  Filled 2023-05-14: qty 10

## 2023-05-14 MED ORDER — REGADENOSON 0.4 MG/5ML IV SOLN
INTRAVENOUS | Status: AC
  Administered 2023-05-14: 0.4 mg via INTRAVENOUS
  Filled 2023-05-14: qty 5

## 2023-05-14 MED ORDER — TECHNETIUM TC 99M TETROFOSMIN IV KIT
10.0000 | PACK | Freq: Once | INTRAVENOUS | Status: AC | PRN
  Administered 2023-05-14: 10 via INTRAVENOUS

## 2023-05-14 MED ORDER — TECHNETIUM TC 99M TETROFOSMIN IV KIT
30.0000 | PACK | Freq: Once | INTRAVENOUS | Status: AC | PRN
  Administered 2023-05-14: 30 via INTRAVENOUS

## 2023-05-14 NOTE — Telephone Encounter (Signed)
 Entered in error

## 2023-05-20 ENCOUNTER — Telehealth: Payer: Self-pay

## 2023-05-20 MED ORDER — METOPROLOL TARTRATE 25 MG PO TABS: 25.0000 mg | ORAL_TABLET | Freq: Two times a day (BID) | ORAL | 5 refills | Status: AC

## 2023-05-20 NOTE — Telephone Encounter (Signed)
 [facility] advised of changes and sent copy of report to PCP. Verified Pharmacy to send to. Nurse verbalized understanding.

## 2023-05-20 NOTE — Telephone Encounter (Signed)
-----   Message from Vishnu P Mallipeddi sent at 05/08/2023 11:37 AM EST ----- Normal pumping function of the heart with basal hyperkinesis, severe LVH, dilatation of the aortic root and ascending aorta (43 and 44 mm respectively), CVP 3 mmHg. Stop Amlodipine and start metoprolol tartarate 25 mg BID (due to small intracavitary gradient from basal hyperkinesis). Do not take metoprolol if BP<100 mm Hg SBP.

## 2024-03-17 ENCOUNTER — Encounter: Payer: Self-pay | Admitting: Gastroenterology

## 2024-04-07 ENCOUNTER — Ambulatory Visit: Admitting: Gastroenterology

## 2024-04-28 ENCOUNTER — Ambulatory Visit: Admitting: Gastroenterology
